# Patient Record
Sex: Female | Born: 1965 | Race: White | Hispanic: Yes | Marital: Single | Smoking: Never smoker
Health system: Southern US, Community
[De-identification: ages and names within clinical notes are randomized; demographics above are authoritative.]

## PROBLEM LIST (undated history)

## (undated) DIAGNOSIS — R102 Pelvic and perineal pain unspecified side: Secondary | ICD-10-CM

## (undated) DIAGNOSIS — E785 Hyperlipidemia, unspecified: Secondary | ICD-10-CM

## (undated) DIAGNOSIS — E079 Disorder of thyroid, unspecified: Secondary | ICD-10-CM

## (undated) DIAGNOSIS — E039 Hypothyroidism, unspecified: Secondary | ICD-10-CM

## (undated) HISTORY — DX: Pelvic and perineal pain: R10.2

## (undated) HISTORY — DX: Pelvic and perineal pain unspecified side: R10.20

## (undated) HISTORY — DX: Hyperlipidemia, unspecified: E78.5

---

## 2008-12-05 DIAGNOSIS — E079 Disorder of thyroid, unspecified: Secondary | ICD-10-CM

## 2008-12-05 HISTORY — DX: Disorder of thyroid, unspecified: E07.9

## 2009-01-06 ENCOUNTER — Emergency Department (HOSPITAL_COMMUNITY): Admission: EM | Admit: 2009-01-06 | Discharge: 2009-01-07 | Payer: Self-pay | Admitting: Emergency Medicine

## 2011-03-22 LAB — POCT I-STAT, CHEM 8
BUN: 13 mg/dL (ref 6–23)
Calcium, Ion: 1.21 mmol/L (ref 1.12–1.32)
Chloride: 98 mEq/L (ref 96–112)
Glucose, Bld: 362 mg/dL — ABNORMAL HIGH (ref 70–99)
HCT: 46 % (ref 36.0–46.0)
Potassium: 3.7 mEq/L (ref 3.5–5.1)

## 2011-03-22 LAB — DIFFERENTIAL
Lymphocytes Relative: 21 % (ref 12–46)
Lymphs Abs: 2.4 10*3/uL (ref 0.7–4.0)
Monocytes Absolute: 0.6 10*3/uL (ref 0.1–1.0)
Monocytes Relative: 5 % (ref 3–12)
Neutro Abs: 8.5 10*3/uL — ABNORMAL HIGH (ref 1.7–7.7)
Neutrophils Relative %: 73 % (ref 43–77)

## 2011-03-22 LAB — URINALYSIS, ROUTINE W REFLEX MICROSCOPIC
Bilirubin Urine: NEGATIVE
Glucose, UA: >1000 mg/dL — AB
Hgb urine dipstick: NEGATIVE
Specific Gravity, Urine: 1.039 — ABNORMAL HIGH (ref 1.005–1.030)
Urobilinogen, UA: 0.2 mg/dL (ref 0.0–1.0)
pH: 5.5 (ref 5.0–8.0)

## 2011-03-22 LAB — HEPATIC FUNCTION PANEL
ALT: 13 U/L (ref 0–35)
Albumin: 3.6 g/dL (ref 3.5–5.2)
Alkaline Phosphatase: 114 U/L (ref 39–117)
Indirect Bilirubin: 0.6 mg/dL (ref 0.3–0.9)
Total Protein: 8 g/dL (ref 6.0–8.3)

## 2011-03-22 LAB — URINE MICROSCOPIC-ADD ON

## 2011-03-22 LAB — CBC
Hemoglobin: 13.7 g/dL (ref 12.0–15.0)
RBC: 4.91 MIL/uL (ref 3.87–5.11)
WBC: 11.6 10*3/uL — ABNORMAL HIGH (ref 4.0–10.5)

## 2011-03-22 LAB — GLUCOSE, CAPILLARY: Glucose-Capillary: 321 mg/dL — ABNORMAL HIGH (ref 70–99)

## 2011-03-22 LAB — LIPASE, BLOOD: Lipase: 52 U/L (ref 11–59)

## 2011-07-29 ENCOUNTER — Other Ambulatory Visit (HOSPITAL_COMMUNITY): Payer: Self-pay | Admitting: *Deleted

## 2011-07-29 DIAGNOSIS — Z1231 Encounter for screening mammogram for malignant neoplasm of breast: Secondary | ICD-10-CM

## 2011-08-04 ENCOUNTER — Ambulatory Visit (HOSPITAL_COMMUNITY)
Admission: RE | Admit: 2011-08-04 | Discharge: 2011-08-04 | Disposition: A | Discharge status: Home / Self Care | Payer: Self-pay | Source: Ambulatory Visit | Attending: Family Medicine | Admitting: Family Medicine

## 2011-08-04 DIAGNOSIS — Z1231 Encounter for screening mammogram for malignant neoplasm of breast: Secondary | ICD-10-CM

## 2012-02-28 ENCOUNTER — Emergency Department (HOSPITAL_COMMUNITY): Payer: Self-pay

## 2012-02-28 ENCOUNTER — Encounter (HOSPITAL_COMMUNITY): Payer: Self-pay | Admitting: *Deleted

## 2012-02-28 ENCOUNTER — Emergency Department (HOSPITAL_COMMUNITY)
Admission: EM | Admit: 2012-02-28 | Discharge: 2012-02-28 | Disposition: A | Discharge status: Hospice | Payer: Self-pay | Attending: Emergency Medicine | Admitting: Emergency Medicine

## 2012-02-28 DIAGNOSIS — IMO0001 Reserved for inherently not codable concepts without codable children: Secondary | ICD-10-CM | POA: Insufficient documentation

## 2012-02-28 DIAGNOSIS — R5381 Other malaise: Secondary | ICD-10-CM | POA: Insufficient documentation

## 2012-02-28 DIAGNOSIS — E119 Type 2 diabetes mellitus without complications: Secondary | ICD-10-CM | POA: Insufficient documentation

## 2012-02-28 DIAGNOSIS — J3489 Other specified disorders of nose and nasal sinuses: Secondary | ICD-10-CM | POA: Insufficient documentation

## 2012-02-28 DIAGNOSIS — R05 Cough: Secondary | ICD-10-CM | POA: Insufficient documentation

## 2012-02-28 DIAGNOSIS — R509 Fever, unspecified: Secondary | ICD-10-CM | POA: Insufficient documentation

## 2012-02-28 DIAGNOSIS — Z79899 Other long term (current) drug therapy: Secondary | ICD-10-CM | POA: Insufficient documentation

## 2012-02-28 DIAGNOSIS — J40 Bronchitis, not specified as acute or chronic: Secondary | ICD-10-CM

## 2012-02-28 DIAGNOSIS — R61 Generalized hyperhidrosis: Secondary | ICD-10-CM | POA: Insufficient documentation

## 2012-02-28 DIAGNOSIS — R059 Cough, unspecified: Secondary | ICD-10-CM | POA: Insufficient documentation

## 2012-02-28 DIAGNOSIS — R0602 Shortness of breath: Secondary | ICD-10-CM | POA: Insufficient documentation

## 2012-02-28 DIAGNOSIS — R070 Pain in throat: Secondary | ICD-10-CM | POA: Insufficient documentation

## 2012-02-28 DIAGNOSIS — R51 Headache: Secondary | ICD-10-CM | POA: Insufficient documentation

## 2012-02-28 HISTORY — DX: Disorder of thyroid, unspecified: E07.9

## 2012-02-28 MED ORDER — HYDROCOD POLST-CHLORPHEN POLST 10-8 MG/5ML PO LQCR
5.0000 mL | Freq: Once | ORAL | Status: AC
  Administered 2012-02-28: 5 mL via ORAL
  Filled 2012-02-28: qty 5

## 2012-02-28 MED ORDER — ALBUTEROL SULFATE HFA 108 (90 BASE) MCG/ACT IN AERS
2.0000 | INHALATION_SPRAY | Freq: Once | RESPIRATORY_TRACT | Status: AC
  Administered 2012-02-28: 2 via RESPIRATORY_TRACT
  Filled 2012-02-28: qty 6.7

## 2012-02-28 MED ORDER — HYDROCODONE-HOMATROPINE 5-1.5 MG/5ML PO SYRP: 5.0000 mL | ORAL_SOLUTION | Freq: Four times a day (QID) | ORAL | Status: AC | PRN

## 2012-02-28 NOTE — ED Provider Notes (Signed)
Medical screening examination/treatment/procedure(s) were performed by non-physician practitioner and as supervising physician I was immediately available for consultation/collaboration.  Cheri Guppy, MD 02/28/12 [phone]

## 2012-02-28 NOTE — ED Provider Notes (Signed)
History     CSN: [id number]  Arrival date & time 02/28/12  4098   First MD Initiated Contact with Patient 02/28/12 [phone]      Chief Complaint  Patient presents with  . Cough    (Consider location/radiation/quality/duration/timing/severity/associated sxs/prior treatment) Patient is a 46 y.o. female presenting with cough. The history is provided by the patient. The history is limited by a language barrier. A language interpreter was used.  Cough This is a new problem. The current episode started more than 2 days ago. The problem occurs constantly. The problem has not changed since onset.The cough is productive of sputum. The maximum temperature recorded prior to her arrival was 100 to 100.9 F. The fever has been present for less than 1 day. Associated symptoms include chills, sweats, headaches, rhinorrhea, sore throat, myalgias and shortness of breath. Pertinent negatives include no ear pain and no wheezing. She has tried cough syrup for the symptoms. She is not a smoker.  pt with symptoms for the last 3 days. Nasal congestion, low grade fever, cough. Too mucinex yesterday with no relief. Denies neck pain or stiffness, denies n/v/d, chest pain, abdominal pain.   Past Medical History  Diagnosis Date  . Diabetes mellitus   . Thyroid disease     History reviewed. No pertinent past surgical history.  No family history on file.  History  Substance Use Topics  . Smoking status: Never Smoker   . Smokeless tobacco: Not on file  . Alcohol Use: No    OB History    Grav Para Term Preterm Abortions TAB SAB Ect Mult Living                  Review of Systems  Constitutional: Positive for fever, chills and fatigue.  HENT: Positive for congestion, sore throat and rhinorrhea. Negative for ear pain, neck pain and neck stiffness.   Eyes: Negative.   Respiratory: Positive for cough and shortness of breath. Negative for chest tightness and wheezing.   Cardiovascular: Negative.     Gastrointestinal: Negative.   Genitourinary: Negative.   Musculoskeletal: Positive for myalgias.  Skin: Negative.   Neurological: Positive for headaches.  Psychiatric/Behavioral: Negative.     Allergies  Review of patient's allergies indicates no known allergies.  Home Medications   Current Outpatient Rx  Name Route Sig Dispense Refill  . BENZOCAINE-MENTHOL 6-10 MG MT LOZG Oral Take 1 lozenge by mouth every 2 (two) hours as needed. For sore throat    . GUAIFENESIN 100 MG/5ML PO LIQD Oral Take 200 mg by mouth 3 (three) times daily as needed. For cough    . LEVOTHYROXINE SODIUM 50 MCG PO TABS Oral Take 50 mcg by mouth daily.    Marland Kitchen METFORMIN HCL 500 MG PO TABS Oral Take 500 mg by mouth 2 (two) times daily with a meal.      BP 130/72  Pulse 89  Temp(Src) 98.3 F (36.8 C) (Oral)  Resp 17  Ht 5\' 2"  (1.575 m)  Wt 160 lb (72.576 kg)  BMI 29.26 kg/m2  SpO2 97%  Physical Exam  Nursing note and vitals reviewed. Constitutional: She is oriented to person, place, and time. She appears well-developed and well-nourished. No distress.  HENT:  Head: Normocephalic.  Right Ear: Tympanic membrane, external ear and ear canal normal.  Left Ear: Tympanic membrane, external ear and ear canal normal.  Nose: Rhinorrhea present.  Mouth/Throat: Uvula is midline, oropharynx is clear and moist and mucous membranes are normal.  Eyes: Conjunctivae are  normal.  Neck: Normal range of motion. Neck supple.  Cardiovascular: Normal rate, regular rhythm and normal heart sounds.   Pulmonary/Chest: Effort normal and breath sounds normal. No respiratory distress. She has no wheezes. She has no rales.       coughing  Abdominal: Soft. Bowel sounds are normal. She exhibits no distension. There is no tenderness.  Musculoskeletal: Normal range of motion.  Lymphadenopathy:    She has no cervical adenopathy.  Neurological: She is alert and oriented to person, place, and time.  Skin: Skin is warm and dry.   Psychiatric: She has a normal mood and affect.    ED Course  Procedures (including critical care time)  Pt with nasal congestion, sore throat, cough. Will get CXR to r/o pneumonia. Pt afebrile. VS normal.  Dg Chest 2 View  02/28/2012  *RADIOLOGY REPORT*  Clinical Data: Cough, fever.  CHEST - 2 VIEW  Comparison: None  Findings: Heart and mediastinal contours are within normal limits. No focal opacities or effusions.  No acute bony abnormality.  IMPRESSION: No active cardiopulmonary disease.  Original Report Authenticated By: Cyndie Chime, M.D.   CXR negative. Pt in NAD. Will treat with cough medications, inhaler. I suspect this is viral. Her lungs are clear, afebrile, symptoms for about 3 days. Instructed to follow up with PCP closely and return if unable to follow up and worsening.    No diagnosis found.    MDM          Lottie Mussel, PA 02/28/12 1535

## 2012-02-28 NOTE — ED Notes (Addendum)
Patient with 3 day hx of cough and being unable to sleep. Patient reported to have a fever on yesterday.  Patient has tried otc treatment w/o relief.  Information provided by family who translated,  She speaks spanish

## 2012-02-28 NOTE — Discharge Instructions (Signed)
Your chest x-ray is normal today, I suspect you have a viral upper respiratory infection and bronchitis. Take tylenol for fever and body aches. Drink plenty of fluids. Albuterol 2 puffs every 4 hrs. Take hycodan for cough as prescribed as needed. Continue robitussin. It may take few days for your symptoms to improve. If worsening, follow up with your doctor or return here.   Bronquitis aguda  (Acute Bronchitis)  Usted padece bronquitis aguda. Esto significa que tiene catarro bronquial. Las vas areas en los pulmones estn irritadas y le duelen (inflamadas). "Aguda" significa de comienzo sbito.  CAUSAS  La causa es el mismo virus que produce el resfro.  SNTOMAS   Dolores en el cuerpo   [employer].   Escalofros.   [employer].   Falta de aire.   Dolor de garganta  TRATAMIENTO  Generalmente la bronquitis aguda se trata con reposo y medicamentos para bajar la fiebre o Secretary/administrator tos. La [employer] de los sntomas deben desaparecer luego de [address] o de [place]. Tome mucho lquido para Restaurant manager, fast food las secreciones y Statistician. El mdico podr indicarle el uso de un inhalador para mejorar los sntomas. El inhalador mejora la falta de aire y [place] a Scientist, physiological tos. Puede tomar analgsicos de venta libre o medicamentos para calmar la tos, el Dentist y la [place]. Un vaporizador de aire fro podr ayudarlo a [employer] bronquiales y Statistician su eliminacin.  En general, no es necesario tomar antibiticos pero pueden prescribirse si fuma, tiene una enfermedad grave, sufre problemas pulmonares crnicos, es [place] persona de edad avanzada o tiene serios riesgos de sufrir complicaciones.Las Environmental consultant y el asma pueden empeorar la bronquitis. Los episodios repetidos de bronquitis pueden causar problemas pulmonares crnicos.  Evite fumar o aspirar el humo de otros fumadores.La exposicin al humo del cigarrillo o a irritantes qumicos har que la bronquitis  empeore. Si fuma, considere el uso de goma de Theatre manager o parches para la piel que contengan nicotina para Paramedic los sntomas de abstinencia. Si deja de fumar, sus pulmones se curarn ms rpido.  La recuperacin de la bronquitis puede ser lenta, pero debera sentirse mejor despus de 2  3 das. La tos a veces puede durar entre 3 y 4 semanas.  Para evitar otro brote de bronquitis aguda:   Deje de fumar.   Lvese las manos con frecuencia o use un desinfectante para manos, para eliminar virus.   Evite el contacto con personas que estn resfriadas o que tengan sntomas de haber contrado un virus.   Trate de no llevar las manos a la boca, la nariz o los ojos.  SOLICITE ATENCIN MDICA DE INMEDIATO SI:   Le sube la fiebre, tiene escalofros o Careers information officer.   Le falta el aire o escupe flema con sangre.   Se deshidrata, se desmaya, vomita repetidas veces o siente un dolor de cabeza muy intenso.   No mejora, o empeora, despus de 1 semana de tratamiento.  ASEGRESE DE QUE:   Comprende estas instrucciones.   Controlar su enfermedad.   Solicitar ayuda de inmediato si no mejora o si empeora.  Document Released: 11/21/2005 Document Revised: 11/10/2011 [facility] Patient Information 2012 [place], [place].

## 2012-08-17 ENCOUNTER — Other Ambulatory Visit (HOSPITAL_COMMUNITY): Payer: Self-pay | Admitting: Geriatric Medicine

## 2012-08-17 DIAGNOSIS — Z1231 Encounter for screening mammogram for malignant neoplasm of breast: Secondary | ICD-10-CM

## 2012-09-05 ENCOUNTER — Ambulatory Visit (HOSPITAL_COMMUNITY)
Admission: RE | Admit: 2012-09-05 | Discharge: 2012-09-05 | Disposition: A | Discharge status: Home / Self Care | Payer: Self-pay | Source: Ambulatory Visit | Attending: Geriatric Medicine | Admitting: Geriatric Medicine

## 2012-09-05 DIAGNOSIS — Z1231 Encounter for screening mammogram for malignant neoplasm of breast: Secondary | ICD-10-CM

## 2012-11-23 ENCOUNTER — Ambulatory Visit (INDEPENDENT_AMBULATORY_CARE_PROVIDER_SITE_OTHER): Payer: Self-pay | Admitting: Obstetrics & Gynecology

## 2012-11-23 ENCOUNTER — Encounter: Payer: Self-pay | Admitting: Obstetrics & Gynecology

## 2012-11-23 VITALS — BP 131/88 | HR 81 | Temp 97.8°F | Ht <= 58 in | Wt 150.0 lb

## 2012-11-23 DIAGNOSIS — Z01812 Encounter for preprocedural laboratory examination: Secondary | ICD-10-CM

## 2012-11-23 DIAGNOSIS — R1900 Intra-abdominal and pelvic swelling, mass and lump, unspecified site: Secondary | ICD-10-CM

## 2012-11-23 LAB — BASIC METABOLIC PANEL
BUN: 17 mg/dL (ref 6–23)
Chloride: 103 mEq/L (ref 96–112)
Potassium: 4.7 mEq/L (ref 3.5–5.3)
Sodium: 138 mEq/L (ref 135–145)

## 2012-11-23 NOTE — [facility] Notes (Signed)
Patient referred from [facility] for pelvic pain with US showing mass- patient has a cd of the ultrasound. States she had a period in October and November, but no period in September.States before that were regular .States periods are normal periods , not heavy. States pelvic pain  And also states has pain in upper abdomen at same time starts about 4 days before period and lasts until period stops which period lasts 3-4 days.

## 2012-11-23 NOTE — [facility] Notes (Signed)
Subjective:     Patient ID: Cynthia Wiley, female   DOB: [birth year], 46 y.o.   MRN: [id number]  HPI Cynthia Wiley is a 46 year old female who presents to clinic today for evaluation of an abdominal mass. The patient states that she started gaining weight and size in her abdomen approximately 3 years ago. She was sent for evaluation from her PCP to Dr. Mayford Knife with [facility] and had an ultrasound on 10/30/12 that should an enlarged uterus and mass. The patient states that she has been having painful periods for the last few months which prompted her to be evaluated. At the worst the pain is 8/10 however it is the few days before her period and during her period and then the pain resolves. She does not have any pain today. She states that her periods are regular and not heavy. She does not have any abnormal discharge.   The patient states that she saw Dr. Mayford Knife again following the [place] and he recommended she see a GYN for surgery.   Review of Systems All negative unless otherwise noted in HPI    Objective:   Physical Exam BP 131/88  Pulse 81  Temp 97.8 F (36.6 C)  Ht 4\' 10"  (1.473 m)  Wt 150 lb (68.04 kg)  BMI 31.35 kg/m2  LMP 10/22/2012 GENERAL: Well-developed, well-nourished female in no acute distress.  HEENT: Normocephalic, atraumatic. Sclerae anicteric.   LUNGS: Lung clear to auscultation. Normal respiratory effort.  HEART: Regular rate and rhythm. ABDOMEN: Firm, nontender, distended. Large, firm mass felt.   PELVIC: Normal external female genitalia. Vagina is pink and rugated.  Normal discharge. Normal cervix contour. Pap smear obtained. Uterus enlarged, measuring 30 cm from the pubic symphysis. No adnexal mass or tenderness.  EXTREMITIES: No cyanosis, clubbing, or edema.     Assessment:     1. Large uterine mass; uterine fibroid vs. Ovarian cyst      Plan:     CT abdomen/pelvis scheduled today BUN/Cr drawn in office today Pap obtained today Patient will  follow up in clinic to discuss results of CT scan and approach for surgery with Dr. Erin Fulling      Pt seen and examined.  30week sized uterine fibroids that fill the entire pelvis.  Pt is NOT a candidate for minimally invasive surgery at this point.  Pt needs a CT to confirm that this mass is uterine.  If that is the case, we will proceed with scheduling a  TAH.  Pt understands this and wants to proceed. Will have pt f/u for OV once results of CT are in.  Attestation of Attending Supervision of Advanced Practitioner (CNM/NP): Evaluation and management procedures were performed by the Advanced Practitioner under my supervision and collaboration.  I have reviewed the Advanced Practitioner's note and chart, and I agree with the management and plan.  HARRAWAY-SMITH, CAROLYN 11:00 AM

## 2012-11-23 NOTE — Patient Instructions (Addendum)
Fibroma uterino  (Uterine Fibroid)  Un fibroma uterino es Facilities manager (tumor) dentro del tero de Pateros. Este tipo de tumor no es Insurance risk surveyor y no se extiende fuera del tero. Una mujer puede tener uno o varios fibromas y algunos pueden ser bastante grandes. Un fibroma puede variar en tamao, peso y Immunologist en que se desarrolla dentro del tero. La mayora de los fibromas no necesitan tratamiento mdico, pero algunos pueden causar dolor o sangrado abundante durante los perodos y [place]. CAUSAS  Un fibroma es el resultado del desarrollo continuo de una nica clula uterina que sigue creciendo (no regulada) que es diferente al resto de las clulas del cuerpo humano. La mayora de las clulas tiene un mecanismo de control que evita que se reproduzcan de Psychologist, occupational.  SNTOMAS   Sangrado.  Dolor y sensacin de presin en la pelvis.  Problemas en la vejiga debido al tamao del fibroma.  Infertilidad y abortos espontneos, segn el tamao y la ubicacin del fibroma. DIAGNSTICO  El diagnstico se hace por examen fsico. El mdico puede palpar los tumores abultados al realizar el examen de la pelvis. Una ecografa puede brindar informacin adicional importante acerca del tamao, la ubicacin y el nmero de tumores. Es raro que sea Passenger transport manager otras pruebas, como tomografa computada o Health visitor.  TRATAMIENTO   Su mdico puede considerar que es conveniente esperar y Pharmacologist. Esto incluye el control del fibroma por parte del mdico para observar si crece o disminuye su tamao.   Podr recomendarle un tratamiento hormonal o el uso de un dispositivo intrauterino (DIU).   En algunos casos es necesaria la ciruga para extirpar el fibroma (miomectoma) o el tero (histerectoma). Esto depender de su situacin. Cuando una mujer desea quedar embarazada y los fibromas interfieren en su fertilidad, el mdico puede recomendar la extirpacin del fibroma.    INSTRUCCIONES PARA EL CUIDADO EN EL HOGAR  Los cuidados en el hogar dependen del tratamiento que haya recibido. En general:   Concurra puntualmente a las citas de control con el mdico.   Slo tome los medicamentos que le indic su mdico. No tome aspirina. Puede ocasionar hemorragias.   Si tiene perodos muy abundantes y debe cambiar un tampn o una toalla higinica en media hora o menos, comunquese con su mdico inmediatamente. Si sus perodos son molestos pero no tan abundantes, acustese con los pies ligeramente elevados por encima del nivel del corazn. Coloque compresas fras en la zona inferior del abdomen.   Si sus perodos son muy abundantes  , anote el nmero de compresas o tampones que [place] cada mes. Lleve esta informacin cuando visite a su mdico.   Consulte al mdico si debe tomar pldoras de hierro.   Incluya vegetales en su dieta.   Si le recetaron un tratamiento hormonal, tome los medicamentos hormonales como le indicaron.   Si le indicaron la ciruga, pdale informacin especfica a su mdico.  SOLICITE ATENCIN MDICA DE INMEDIATO SI:   Siente dolor o clicos en la pelvis y no puede controlarlos con los medicamentos.   El dolor en la pelvis aumenta de manera repentina.   Aumenta el sangrado entre los perodos o [employer].   Se siente mareado o tiene episodios de [place].  ASEGRESE DE QUE:   Comprende estas instrucciones.  Controlar su enfermedad.  Solicitar ayuda de inmediato si no mejora o si empeora. Document Released: 11/21/2005 Document Revised: 02/13/2012 [facility] Patient Information 2013 [place], [place]. Fibromas (Fibroids) Los fibromas son  bultos (tumores) que pueden Conservation officer, nature del cuerpo de Nurse, mental health. Estos tumores no son cancerosos. Pueden variar en tamao, peso y lugar en el que crecen. CUIDADOS EN EL HOGAR  No tome aspirina.  Anote el nmero de apsitos o tampones que [place] durante el perodo. Infrmelo  a su mdico. Esto puede ayudar a determinar el mejor tratamiento para usted. SOLICITE AYUDA DE INMEDIATO SI:  Siente dolor en la zona inferior del vientre (abdomen) y no se alivia con analgsicos.  Tiene clicos que no se calman con medicamentos  Aumenta el sangrado entre perodos o durante el mismo.  Sufre mareos o se desvanece (se desmaya).  El dolor en el vientre [place]. ASEGRESE DE QUE:  Comprende estas instrucciones.  Controlar su enfermedad.  Solicitar ayuda de inmediato si no mejora o empeora. Document Released: 03/08/2011 Document Revised: 02/13/2012 [facility] Patient Information 2013 [place], [place].

## 2012-11-30 ENCOUNTER — Ambulatory Visit (HOSPITAL_COMMUNITY)
Admission: RE | Admit: 2012-11-30 | Discharge: 2012-11-30 | Disposition: A | Discharge status: Home / Self Care | Payer: Self-pay | Source: Ambulatory Visit | Attending: Obstetrics & Gynecology | Admitting: Obstetrics & Gynecology

## 2012-11-30 ENCOUNTER — Other Ambulatory Visit: Payer: Self-pay | Admitting: General Practice

## 2012-11-30 DIAGNOSIS — R1900 Intra-abdominal and pelvic swelling, mass and lump, unspecified site: Secondary | ICD-10-CM

## 2012-11-30 DIAGNOSIS — D259 Leiomyoma of uterus, unspecified: Secondary | ICD-10-CM | POA: Insufficient documentation

## 2012-11-30 DIAGNOSIS — N1330 Unspecified hydronephrosis: Secondary | ICD-10-CM | POA: Insufficient documentation

## 2012-11-30 MED ORDER — IOHEXOL 300 MG/ML  SOLN
100.0000 mL | Freq: Once | INTRAMUSCULAR | Status: AC | PRN
  Administered 2012-11-30: 100 mL via INTRAVENOUS

## 2012-12-03 ENCOUNTER — Ambulatory Visit (INDEPENDENT_AMBULATORY_CARE_PROVIDER_SITE_OTHER): Payer: Self-pay | Admitting: Obstetrics & Gynecology

## 2012-12-03 ENCOUNTER — Encounter: Payer: Self-pay | Admitting: Obstetrics & Gynecology

## 2012-12-03 VITALS — BP 142/77 | HR 77 | Temp 97.0°F | Ht <= 58 in | Wt 153.3 lb

## 2012-12-03 DIAGNOSIS — D259 Leiomyoma of uterus, unspecified: Secondary | ICD-10-CM

## 2012-12-03 DIAGNOSIS — E119 Type 2 diabetes mellitus without complications: Secondary | ICD-10-CM

## 2012-12-03 DIAGNOSIS — D219 Benign neoplasm of connective and other soft tissue, unspecified: Secondary | ICD-10-CM

## 2012-12-03 LAB — HEMOGLOBIN A1C
Hgb A1c MFr Bld: 7.1 % — ABNORMAL HIGH (ref <5.7)
Mean Plasma Glucose: 157 mg/dL — ABNORMAL HIGH (ref <117)

## 2012-12-03 NOTE — Patient Instructions (Addendum)
Informacin sobre la histerectoma  (Hysterectomy Information)  Neomia Dear histerectoma es un procedimiento en el que se extirpa quirrgicamente el tero. Ya no tendr perodos menstruales ni quedar embarazada. Tambin durante esta ciruga podrn extirparle las trompas y los ovarios (salpingo-ooforectoma bilateral).  RAZONES PARA REALIZAR UNA HISTERECTOMA   Sangrado persistente, anormal.  Infeccin o dolor plvico de larga duracin (crnico).  El revestimiento del tero (endometrio) comienza a desarrollarse fuera del tero (endometriosis).  El endometrio comienza a desarrollarse en el msculo del tero (adenomiosis).  El tero desciende hacia la vagina (prolapso de los rganos plvicos).  Fibromas uterinos sintomticos.  Clulas precancerosas.  Cncer cervical o cncer uterino. TIPOS DE HISTERECTOMA   Histerectoma supracervical. Este tipo de ciruga elimina la parte superior del tero, pero no el cuello del tero.  Histerectoma total. Se extirpa el tero y el cuello uterino.  Histerectoma radical. Se extrae el tero, el cuello uterino y el tejido fibroso que sostiene el tero en su lugar en la pelvis (parametrio). FORMAS EN QUE SE PUEDE REALIZAR UNA HISTERECTOMA   Histerectoma abdominal. Se realiza gran corte quirrgico (incisin) en el abdomen. Se extirpa el tero a travs de esta incisin.  Histerectoma vaginal. Se hace una incisin en la vagina. Se extirpa el tero a travs de esta incisin. No hay incisiones abdominales.  Histerectoma laparoscpica convencional. Se inserta un tubo delgado que emite luz y que posee una cmara (laparoscopio) en 3 o 4 incisiones pequeas en el abdomen. El tero se corta en trozos pequeos. Las piezas pequeas se eliminan a travs de las incisiones, o se retiran a travs de la vagina.  Histerectoma laparoscpica vaginal asistida. Se hacen tres o cuatro incisiones pequeas en el abdomen. Parte de la ciruga se realiza por va laparoscpica y  parte por va vaginal. El tero se extirpa a travs de la vagina.  Histerectoma laparoscpica asistida por robot. Se inserta un laparoscopio en 3 o 4 incisiones pequeas en el abdomen. Se utiliza un dispositivo controlado por computadora para que el cirujano vea una imagen en 3D. Esto permite movimientos ms precisos de los instrumentos quirrgicos. El tero se corta en trozos pequeos y se retira a travs de las incisiones o se elimina a travs de la vagina. RIESGOS DE LA HISTERECTOMA   Hemorragia y riesgos de necesitar una transfusin sangunea. Informe a su mdico si no quiere recibir productos de Risk manager.  Cogulos de [employer] piernas o los pulmones.  Infecciones.  Lesin en los rganos circundantes.  Problemas o efectos secundarios por la anestesia.  Conversin a una histerectoma abdominal. QU ESPERAR DESPUES DE UNA HISTERECTOMA   Le administrarn medicamentos para calmar el dolor.  Necesitar que alguien permanezca con usted durante los primeros 3 a 5 das despus de regresar a Higher education careers adviser.  Tendr que hacer un control con su cirujano en 2 a 4 semanas despus de la ciruga para evaluar su progreso.  Podr tener sntomas tempranos de menopausia, como sofocos, sudores nocturnos e insomnio.  Si le han realizado una histerectoma por un problema que no era cncer u otra enfermedad que podra causar cncer, ya no necesitar un Papanicolaou. Sin embargo, si ya no necesita hacerse un Papanicolau, es una buena idea hacerse un examen regularmente para asegurarse de que no hay otros problemas. Document Released: 11/21/2005 Document Revised: 02/13/2012 [facility] Patient Information 2013 [place], [place].

## 2012-12-04 NOTE — [facility] Notes (Signed)
Subjective:     Patient ID: Cynthia Wiley, female   DOB: 12/31/1965, 46 y.o.   MRN: [id number]  HPI Pt continues to c/o pain and bleeding from pelvic mass.  She is here to f/u her CT and to discuss surgery.  Past Medical History  Diagnosis Date  . Diabetes mellitus   . Thyroid disease   . Pelvic pain in female    No past surgical history on file. History   Social History  . Marital Status: Single    Spouse Name: N/A    Number of Children: N/A  . Years of Education: N/A   Occupational History  . Not on file.   Social History Main Topics  . Smoking status: Never Smoker   . Smokeless tobacco: Never Used  . Alcohol Use: No  . Drug Use: No  . Sexually Active: Yes    Birth Control/ Protection: None   Other Topics Concern  . Not on file   Social History Narrative  . No narrative on file   Family History  Problem Relation Age of Onset  . Diabetes Father   . Heart disease Father   . Diabetes Sister   . Hyperlipidemia Brother    Current Outpatient Prescriptions on File Prior to Visit  Medication Sig Dispense Refill  . benzocaine-menthol (CHLORASEPTIC SORE THROAT) 6-10 MG lozenge Take 1 lozenge by mouth every 2 (two) hours as needed. For sore throat      . Dextromethorphan-Guaifenesin (ROBITUSSIN DM SUGAR FREE PO) Take 30 mLs by mouth as needed.      Marland Kitchen guaiFENesin (ROBITUSSIN) 100 MG/5ML liquid Take 200 mg by mouth 3 (three) times daily as needed. For cough      . levothyroxine (SYNTHROID, LEVOTHROID) 50 MCG tablet Take 50 mcg by mouth daily.      . metFORMIN (GLUCOPHAGE) 500 MG tablet Take 500 mg by mouth 2 (two) times daily with a meal. Takes 850mg  BID- changed 6/13      . loratadine (CLARITIN) 10 MG tablet Take 10 mg by mouth daily.         Review of Systems     Objective:   Physical ExamBP 142/77  Pulse 77  Temp 97 F (36.1 C) (Oral)  Ht 4\' 10"  (1.473 m)  Wt 153 lb 4.8 oz (69.536 kg)  BMI 32.04 kg/m2  LMP 10/22/2012  Lungs: CTA CV: RRR Abd: soft,  NT, ND pelvic mass palpable to 30cm.  Mobile.    PAP 12/20 Neg with neg HR HPV CT 11/30/12 Findings: Multiple partially calcified and partially degenerated  fibroids are seen arising from the uterus which extend into the mid  abdomen. The largest fibroid arises from the posterior upper  uterine corpus and fundus and measures 22 x 16 by 15 cm. The  second largest fibroid arises from the anterior uterine corpus,  measures 11 cm and shows areas of central cystic degeneration.  Overall uterine dimensions 24.1 x 16.2 x 21.8 cm, for estimated  volume of 4451 ml.  There is no evidence of invasion of adjacent structures. There is  mass effect on the distal right ureter causing severe right  hydronephrosis and postobstructive renal parenchymal atrophy.  There is no evidence of renal masses or left-sided hydronephrosis.  The liver, gallbladder, pancreas, spleen, adrenal glands are normal  in appearance. No evidence of abdominal or pelvic lymphadenopathy.  No evidence of inflammatory process or abnormal fluid collections.  No evidence of bowel obstruction or hernia.  IMPRESSION:  1. Markedly enlarged  myomatous uterus, with largest fibroid  measuring 22 cm.  2. Severe right renal hydronephrosis and parenchymal atrophy, due  to distal right ureteral compression by the myomatous uterus.       Assessment:    Large fibroid uterus- symptomatic and assoc with significant hydronephrosis.  D/W treatment options which at this size is limited to surgery.  Pt has completed child bearing and desires surgical management with TAH with bilateral  Salpingectomy.  I reveiwed with pt the results of her CT which was significant for hydronephrosis   we will contact urology and have ureteral stents placed prior to the hyst to try to limit/prevent ureteral injury.  Plan:     HbA1C today Schedule TAH with Bilateral salpingectomy with placement of bilateral ureteral stents prior to the procedure by urology

## 2013-01-14 ENCOUNTER — Other Ambulatory Visit: Payer: Self-pay | Admitting: Urology

## 2013-01-17 ENCOUNTER — Ambulatory Visit: Payer: Self-pay | Admitting: Obstetrics & Gynecology

## 2013-01-23 ENCOUNTER — Encounter (HOSPITAL_COMMUNITY): Payer: Self-pay

## 2013-01-23 ENCOUNTER — Encounter (HOSPITAL_COMMUNITY)
Admission: RE | Admit: 2013-01-23 | Discharge: 2013-01-23 | Disposition: A | Discharge status: Home / Self Care | Payer: MEDICAID | Source: Ambulatory Visit | Attending: Obstetrics & Gynecology | Admitting: Obstetrics & Gynecology

## 2013-01-23 ENCOUNTER — Other Ambulatory Visit: Payer: Self-pay

## 2013-01-23 ENCOUNTER — Encounter (HOSPITAL_COMMUNITY): Payer: Self-pay | Admitting: Pharmacy Technician

## 2013-01-23 HISTORY — DX: Hypothyroidism, unspecified: E03.9

## 2013-01-23 LAB — BASIC METABOLIC PANEL
BUN: 15 mg/dL (ref 6–23)
Chloride: 101 mEq/L (ref 96–112)
Creatinine, Ser: 0.74 mg/dL (ref 0.50–1.10)
GFR calc Af Amer: >90 mL/min (ref >90)
GFR calc non Af Amer: >90 mL/min (ref >90)
Potassium: 3.9 mEq/L (ref 3.5–5.1)

## 2013-01-23 LAB — CBC
MCHC: 33.4 g/dL (ref 30.0–36.0)
MCV: 85.3 fL (ref 78.0–100.0)
Platelets: 374 10*3/uL (ref 150–400)
RDW: 14.1 % (ref 11.5–15.5)
WBC: 7.9 10*3/uL (ref 4.0–10.5)

## 2013-01-23 LAB — SURGICAL PCR SCREEN: MRSA, PCR: INVALID — AB

## 2013-01-23 NOTE — Patient Instructions (Addendum)
   Your procedure is scheduled NW:[id number] February 27th  Enter through the Main Entrance of [facility] at:8am Pick up the phone at the desk and dial [phone] and inform us of your arrival.  Please call this number if you have any problems the morning of surgery: [phone]  Remember: Do not eat or drink anything after midnight on Wednesday Hold your metformin for 24 hours-last dose Wednesday morning  Do not wear jewelry, make-up, or FINGER nail polish No metal in your hair or on your body. Do not wear lotions, powders, perfumes. You may wear deodorant.  Please use your CHG wash as directed prior to surgery.  Do not shave anywhere for at least 12 hours prior to first CHG shower.  Do not bring valuables to the hospital. Contacts, dentures or bridgework may not be worn into surgery.  Leave suitcase in the car. After Surgery it may be brought to your room. For patients being admitted to the hospital, checkout time is 11:00am the day of discharge.  Patients discharged on the day of surgery will not be allowed to drive home.

## 2013-01-23 NOTE — Pre-Procedure Instructions (Signed)
Eda Royal assisted with pat visit for Interpretation

## 2013-01-25 LAB — MRSA CULTURE

## 2013-01-31 ENCOUNTER — Inpatient Hospital Stay (HOSPITAL_COMMUNITY)
Admission: RE | Admit: 2013-01-31 | Discharge: 2013-02-02 | DRG: 742 | Disposition: A | Discharge status: Home / Self Care | Payer: MEDICAID | Source: Ambulatory Visit | Attending: Obstetrics & Gynecology | Admitting: Obstetrics & Gynecology

## 2013-01-31 ENCOUNTER — Encounter (HOSPITAL_COMMUNITY)
Admission: RE | Disposition: A | Discharge status: Home / Self Care | Payer: Self-pay | Source: Ambulatory Visit | Attending: Obstetrics & Gynecology

## 2013-01-31 ENCOUNTER — Encounter (HOSPITAL_COMMUNITY): Payer: Self-pay | Admitting: Anesthesiology

## 2013-01-31 ENCOUNTER — Inpatient Hospital Stay: Admit: 2013-01-31 | Payer: Self-pay | Admitting: Obstetrics & Gynecology

## 2013-01-31 ENCOUNTER — Inpatient Hospital Stay (HOSPITAL_COMMUNITY): Payer: MEDICAID | Admitting: Anesthesiology

## 2013-01-31 ENCOUNTER — Inpatient Hospital Stay (HOSPITAL_COMMUNITY): Payer: MEDICAID

## 2013-01-31 DIAGNOSIS — D219 Benign neoplasm of connective and other soft tissue, unspecified: Secondary | ICD-10-CM

## 2013-01-31 DIAGNOSIS — D251 Intramural leiomyoma of uterus: Secondary | ICD-10-CM | POA: Diagnosis present

## 2013-01-31 DIAGNOSIS — N1330 Unspecified hydronephrosis: Secondary | ICD-10-CM

## 2013-01-31 DIAGNOSIS — N920 Excessive and frequent menstruation with regular cycle: Secondary | ICD-10-CM | POA: Diagnosis present

## 2013-01-31 DIAGNOSIS — N949 Unspecified condition associated with female genital organs and menstrual cycle: Secondary | ICD-10-CM | POA: Diagnosis present

## 2013-01-31 DIAGNOSIS — N838 Other noninflammatory disorders of ovary, fallopian tube and broad ligament: Secondary | ICD-10-CM | POA: Diagnosis present

## 2013-01-31 DIAGNOSIS — D259 Leiomyoma of uterus, unspecified: Secondary | ICD-10-CM

## 2013-01-31 DIAGNOSIS — D252 Subserosal leiomyoma of uterus: Principal | ICD-10-CM

## 2013-01-31 HISTORY — PX: CYSTOSCOPY WITH STENT PLACEMENT: SHX5790

## 2013-01-31 HISTORY — PX: BILATERAL SALPINGECTOMY: SHX5743

## 2013-01-31 HISTORY — PX: ABDOMINAL HYSTERECTOMY: SHX81

## 2013-01-31 LAB — CBC
MCH: 28.7 pg (ref 26.0–34.0)
MCHC: 33 g/dL (ref 30.0–36.0)
Platelets: 224 10*3/uL (ref 150–400)
RBC: 2.02 MIL/uL — ABNORMAL LOW (ref 3.87–5.11)
RDW: 14.1 % (ref 11.5–15.5)

## 2013-01-31 LAB — PREGNANCY, URINE: Preg Test, Ur: NEGATIVE

## 2013-01-31 LAB — ABO/RH: ABO/RH(D): O POS

## 2013-01-31 SURGERY — HYSTERECTOMY, ABDOMINAL
Anesthesia: General | Site: Urethra | Laterality: Right | Wound class: Clean Contaminated

## 2013-01-31 SURGERY — HYSTERECTOMY, ABDOMINAL
Anesthesia: Choice | Site: Abdomen

## 2013-01-31 MED ORDER — OXYCODONE-ACETAMINOPHEN 5-325 MG PO TABS
1.0000 | ORAL_TABLET | ORAL | Status: DC | PRN
  Administered 2013-02-01 – 2013-02-02 (×4): 1 via ORAL
  Filled 2013-01-31 (×4): qty 1

## 2013-01-31 MED ORDER — CEFAZOLIN SODIUM-DEXTROSE 2-3 GM-% IV SOLR
INTRAVENOUS | Status: AC
  Filled 2013-01-31: qty 50

## 2013-01-31 MED ORDER — 0.9 % SODIUM CHLORIDE (POUR BTL) OPTIME
TOPICAL | Status: DC | PRN
  Administered 2013-01-31 (×5): 1000 mL

## 2013-01-31 MED ORDER — DOCUSATE SODIUM 100 MG PO CAPS: 100.0000 mg | ORAL_CAPSULE | Freq: Two times a day (BID) | ORAL | Status: DC | PRN

## 2013-01-31 MED ORDER — MIDAZOLAM HCL 2 MG/2ML IJ SOLN
INTRAMUSCULAR | Status: AC
  Filled 2013-01-31: qty 2

## 2013-01-31 MED ORDER — SIMETHICONE 80 MG PO CHEW: 80.0000 mg | CHEWABLE_TABLET | Freq: Four times a day (QID) | ORAL | Status: DC | PRN

## 2013-01-31 MED ORDER — PANTOPRAZOLE SODIUM 40 MG IV SOLR
40.0000 mg | Freq: Every day | INTRAVENOUS | Status: DC
  Administered 2013-01-31 – 2013-02-01 (×2): 40 mg via INTRAVENOUS
  Filled 2013-01-31 (×3): qty 40

## 2013-01-31 MED ORDER — NEOSTIGMINE METHYLSULFATE 1 MG/ML IJ SOLN
INTRAMUSCULAR | Status: AC
  Filled 2013-01-31: qty 1

## 2013-01-31 MED ORDER — NALOXONE HCL 0.4 MG/ML IJ SOLN: 0.4000 mg | INTRAMUSCULAR | Status: DC | PRN

## 2013-01-31 MED ORDER — INFLUENZA VIRUS VACC SPLIT PF IM SUSP
0.5000 mL | INTRAMUSCULAR | Status: AC
  Administered 2013-02-01: 0.5 mL via INTRAMUSCULAR
  Filled 2013-01-31: qty 0.5

## 2013-01-31 MED ORDER — MORPHINE SULFATE 4 MG/ML IJ SOLN: 1.0000 mg | INTRAMUSCULAR | Status: DC | PRN

## 2013-01-31 MED ORDER — DEXTROSE-NACL 5-0.45 % IV SOLN
INTRAVENOUS | Status: DC
  Administered 2013-01-31 – 2013-02-01 (×2): via INTRAVENOUS

## 2013-01-31 MED ORDER — BUPIVACAINE HCL (PF) 0.25 % IJ SOLN
INTRAMUSCULAR | Status: DC | PRN
  Administered 2013-01-31: 20 mL

## 2013-01-31 MED ORDER — HETASTARCH-ELECTROLYTES 6 % IV SOLN
INTRAVENOUS | Status: DC | PRN
  Administered 2013-01-31: 12:00:00 via INTRAVENOUS

## 2013-01-31 MED ORDER — FENTANYL CITRATE 0.05 MG/ML IJ SOLN
25.0000 ug | INTRAMUSCULAR | Status: DC | PRN
  Administered 2013-01-31: 50 ug via INTRAVENOUS

## 2013-01-31 MED ORDER — SODIUM CHLORIDE 0.9 % IJ SOLN: 9.0000 mL | INTRAMUSCULAR | Status: DC | PRN

## 2013-01-31 MED ORDER — DEXTROSE 5 % IV SOLN
3.0000 g | INTRAVENOUS | Status: DC
  Filled 2013-01-31: qty 3000

## 2013-01-31 MED ORDER — NEOSTIGMINE METHYLSULFATE 1 MG/ML IJ SOLN
INTRAMUSCULAR | Status: DC | PRN
  Administered 2013-01-31: 5 mg via INTRAVENOUS

## 2013-01-31 MED ORDER — EPHEDRINE SULFATE 50 MG/ML IJ SOLN
INTRAMUSCULAR | Status: DC | PRN
  Administered 2013-01-31: 10 mg via INTRAVENOUS
  Administered 2013-01-31: 15 mg via INTRAVENOUS

## 2013-01-31 MED ORDER — ZOLPIDEM TARTRATE 5 MG PO TABS: 5.0000 mg | ORAL_TABLET | Freq: Every evening | ORAL | Status: DC | PRN

## 2013-01-31 MED ORDER — ONDANSETRON HCL 4 MG/2ML IJ SOLN: 4.0000 mg | Freq: Four times a day (QID) | INTRAMUSCULAR | Status: DC | PRN

## 2013-01-31 MED ORDER — HYDROMORPHONE HCL PF 1 MG/ML IJ SOLN
INTRAMUSCULAR | Status: DC | PRN
  Administered 2013-01-31: 1 mg via INTRAVENOUS

## 2013-01-31 MED ORDER — LIDOCAINE HCL (CARDIAC) 20 MG/ML IV SOLN
INTRAVENOUS | Status: DC | PRN
  Administered 2013-01-31: 50 mg via INTRAVENOUS

## 2013-01-31 MED ORDER — MORPHINE SULFATE (PF) 1 MG/ML IV SOLN
INTRAVENOUS | Status: DC
  Administered 2013-01-31: 3 mg via INTRAVENOUS
  Administered 2013-01-31: 9 mg via INTRAVENOUS
  Administered 2013-01-31: 15:00:00 via INTRAVENOUS
  Administered 2013-02-01: 3 mg via INTRAVENOUS
  Administered 2013-02-01: 1.5 mg via INTRAVENOUS
  Administered 2013-02-01: 4.5 mg via INTRAVENOUS
  Filled 2013-01-31: qty 25

## 2013-01-31 MED ORDER — MENTHOL 3 MG MT LOZG
1.0000 | LOZENGE | OROMUCOSAL | Status: DC | PRN
  Administered 2013-01-31: 3 mg via ORAL
  Filled 2013-01-31: qty 9

## 2013-01-31 MED ORDER — STERILE WATER FOR IRRIGATION IR SOLN
Status: DC | PRN
  Administered 2013-01-31: 3000 mL

## 2013-01-31 MED ORDER — PHENYLEPHRINE 40 MCG/ML (10ML) SYRINGE FOR IV PUSH (FOR BLOOD PRESSURE SUPPORT)
PREFILLED_SYRINGE | INTRAVENOUS | Status: AC
  Filled 2013-01-31: qty 5

## 2013-01-31 MED ORDER — METFORMIN HCL 850 MG PO TABS
850.0000 mg | ORAL_TABLET | Freq: Two times a day (BID) | ORAL | Status: DC
  Administered 2013-02-01 – 2013-02-02 (×2): 850 mg via ORAL
  Filled 2013-01-31 (×6): qty 1

## 2013-01-31 MED ORDER — PROPOFOL 10 MG/ML IV EMUL
INTRAVENOUS | Status: AC
  Filled 2013-01-31: qty 20

## 2013-01-31 MED ORDER — EPHEDRINE 5 MG/ML INJ
INTRAVENOUS | Status: AC
  Filled 2013-01-31: qty 10

## 2013-01-31 MED ORDER — PHENYLEPHRINE 40 MCG/ML (10ML) SYRINGE FOR IV PUSH (FOR BLOOD PRESSURE SUPPORT)
PREFILLED_SYRINGE | INTRAVENOUS | Status: AC
  Filled 2013-01-31: qty 10

## 2013-01-31 MED ORDER — ONDANSETRON HCL 4 MG/2ML IJ SOLN
INTRAMUSCULAR | Status: AC
  Filled 2013-01-31: qty 2

## 2013-01-31 MED ORDER — FENTANYL CITRATE 0.05 MG/ML IJ SOLN
INTRAMUSCULAR | Status: AC
  Filled 2013-01-31: qty 5

## 2013-01-31 MED ORDER — KETOROLAC TROMETHAMINE 30 MG/ML IJ SOLN: 15.0000 mg | Freq: Once | INTRAMUSCULAR | Status: DC | PRN

## 2013-01-31 MED ORDER — MIDAZOLAM HCL 2 MG/2ML IJ SOLN: 0.5000 mg | Freq: Once | INTRAMUSCULAR | Status: DC | PRN

## 2013-01-31 MED ORDER — LIDOCAINE HCL (CARDIAC) 20 MG/ML IV SOLN
INTRAVENOUS | Status: AC
  Filled 2013-01-31: qty 5

## 2013-01-31 MED ORDER — FENTANYL CITRATE 0.05 MG/ML IJ SOLN
INTRAMUSCULAR | Status: DC | PRN
  Administered 2013-01-31 (×3): 100 ug via INTRAVENOUS
  Administered 2013-01-31: 50 ug via INTRAVENOUS

## 2013-01-31 MED ORDER — PHENYLEPHRINE HCL 10 MG/ML IJ SOLN
INTRAMUSCULAR | Status: DC | PRN
  Administered 2013-01-31: 80 ug via INTRAVENOUS
  Administered 2013-01-31: 200 ug via INTRAVENOUS
  Administered 2013-01-31: 160 ug via INTRAVENOUS
  Administered 2013-01-31: 40 ug via INTRAVENOUS
  Administered 2013-01-31: 200 ug via INTRAVENOUS
  Administered 2013-01-31 (×2): 40 ug via INTRAVENOUS
  Administered 2013-01-31 (×3): 80 ug via INTRAVENOUS
  Administered 2013-01-31: 160 ug via INTRAVENOUS

## 2013-01-31 MED ORDER — MIDAZOLAM HCL 5 MG/5ML IJ SOLN
INTRAMUSCULAR | Status: DC | PRN
  Administered 2013-01-31: 2 mg via INTRAVENOUS

## 2013-01-31 MED ORDER — ONDANSETRON HCL 4 MG PO TABS: 4.0000 mg | ORAL_TABLET | Freq: Four times a day (QID) | ORAL | Status: DC | PRN

## 2013-01-31 MED ORDER — FENTANYL CITRATE 0.05 MG/ML IJ SOLN
INTRAMUSCULAR | Status: AC
  Administered 2013-01-31: 50 ug via INTRAVENOUS
  Filled 2013-01-31: qty 2

## 2013-01-31 MED ORDER — PROPOFOL 10 MG/ML IV EMUL
INTRAVENOUS | Status: DC | PRN
  Administered 2013-01-31: 160 mg via INTRAVENOUS
  Administered 2013-01-31: 20 mg via INTRAVENOUS

## 2013-01-31 MED ORDER — DIPHENHYDRAMINE HCL 50 MG/ML IJ SOLN: 12.5000 mg | Freq: Four times a day (QID) | INTRAMUSCULAR | Status: DC | PRN

## 2013-01-31 MED ORDER — ROCURONIUM BROMIDE 100 MG/10ML IV SOLN
INTRAVENOUS | Status: DC | PRN
  Administered 2013-01-31: 20 mg via INTRAVENOUS
  Administered 2013-01-31: 5 mg via INTRAVENOUS
  Administered 2013-01-31: 35 mg via INTRAVENOUS
  Administered 2013-01-31: 20 mg via INTRAVENOUS

## 2013-01-31 MED ORDER — GLYCOPYRROLATE 0.2 MG/ML IJ SOLN
INTRAMUSCULAR | Status: AC
  Filled 2013-01-31: qty 1

## 2013-01-31 MED ORDER — MEPERIDINE HCL 25 MG/ML IJ SOLN
6.2500 mg | INTRAMUSCULAR | Status: DC | PRN
Start: 2013-01-31 — End: 2013-01-31

## 2013-01-31 MED ORDER — DIPHENHYDRAMINE HCL 12.5 MG/5ML PO ELIX: 12.5000 mg | ORAL_SOLUTION | Freq: Four times a day (QID) | ORAL | Status: DC | PRN

## 2013-01-31 MED ORDER — GLYCOPYRROLATE 0.2 MG/ML IJ SOLN
INTRAMUSCULAR | Status: DC | PRN
  Administered 2013-01-31: 1 mg via INTRAVENOUS

## 2013-01-31 MED ORDER — HETASTARCH-ELECTROLYTES 6 % IV SOLN
INTRAVENOUS | Status: AC
  Filled 2013-01-31: qty 500

## 2013-01-31 MED ORDER — PNEUMOCOCCAL VAC POLYVALENT 25 MCG/0.5ML IJ INJ
0.5000 mL | INJECTION | INTRAMUSCULAR | Status: AC
  Administered 2013-02-02: 0.5 mL via INTRAMUSCULAR
  Filled 2013-01-31: qty 0.5

## 2013-01-31 MED ORDER — LEVOTHYROXINE SODIUM 50 MCG PO TABS
50.0000 ug | ORAL_TABLET | ORAL | Status: DC
  Administered 2013-02-01 – 2013-02-02 (×2): 50 ug via ORAL
  Filled 2013-01-31 (×3): qty 1

## 2013-01-31 MED ORDER — HYDROMORPHONE HCL PF 1 MG/ML IJ SOLN
INTRAMUSCULAR | Status: AC
  Filled 2013-01-31: qty 1

## 2013-01-31 MED ORDER — PROMETHAZINE HCL 25 MG/ML IJ SOLN: 6.2500 mg | INTRAMUSCULAR | Status: DC | PRN

## 2013-01-31 MED ORDER — ONDANSETRON HCL 4 MG/2ML IJ SOLN
INTRAMUSCULAR | Status: DC | PRN
  Administered 2013-01-31: 4 mg via INTRAVENOUS

## 2013-01-31 MED ORDER — CEFAZOLIN SODIUM-DEXTROSE 2-3 GM-% IV SOLR
2.0000 g | Freq: Once | INTRAVENOUS | Status: AC
  Administered 2013-01-31: 2 g via INTRAVENOUS

## 2013-01-31 MED ORDER — CIPROFLOXACIN IN D5W 400 MG/200ML IV SOLN
400.0000 mg | INTRAVENOUS | Status: AC
  Administered 2013-01-31: 400 mg via INTRAVENOUS
  Filled 2013-01-31: qty 200

## 2013-01-31 MED ORDER — BUPIVACAINE HCL (PF) 0.25 % IJ SOLN
INTRAMUSCULAR | Status: AC
  Filled 2013-01-31: qty 30

## 2013-01-31 MED ORDER — LACTATED RINGERS IV SOLN
INTRAVENOUS | Status: DC
  Administered 2013-01-31 (×5): via INTRAVENOUS

## 2013-01-31 SURGICAL SUPPLY — 66 items
BARRIER ADHS 3X4 INTERCEED (GAUZE/BANDAGES/DRESSINGS) IMPLANT
BENZOIN TINCTURE PRP APPL 2/3 (GAUZE/BANDAGES/DRESSINGS) IMPLANT
BINDER ABD UNIV 10 28-50 (GAUZE/BANDAGES/DRESSINGS) ×8 IMPLANT
BINDER ABDOM UNIV 10 (GAUZE/BANDAGES/DRESSINGS) ×10
CANISTER SUCTION 2500CC (MISCELLANEOUS) ×10 IMPLANT
CATH ROBINSON RED A/P 16FR (CATHETERS) IMPLANT
CATH URET 5FR 28IN OPEN ENDED (CATHETERS) IMPLANT
CELLS DAT CNTRL 66122 CELL SVR (MISCELLANEOUS) IMPLANT
CHLORAPREP W/TINT 26ML (MISCELLANEOUS) ×5 IMPLANT
CLOTH BEACON ORANGE TIMEOUT ST (SAFETY) ×5 IMPLANT
CONT PATH 16OZ SNAP LID 3702 (MISCELLANEOUS) ×5 IMPLANT
COVER TABLE BACK 60X90 (DRAPES) ×5 IMPLANT
DECANTER SPIKE VIAL GLASS SM (MISCELLANEOUS) IMPLANT
DRAPE C-ARM 42X72 X-RAY (DRAPES) ×5 IMPLANT
DRAPE HYSTEROSCOPY (DRAPE) IMPLANT
DRESSING TELFA 8X3 (GAUZE/BANDAGES/DRESSINGS) ×5 IMPLANT
GAUZE SPONGE 4X4 16PLY XRAY LF (GAUZE/BANDAGES/DRESSINGS) ×5 IMPLANT
GLOVE BIO SURGEON STRL SZ7 (GLOVE) ×5 IMPLANT
GLOVE BIOGEL PI IND STRL 7.0 (GLOVE) ×4 IMPLANT
GLOVE BIOGEL PI INDICATOR 7.0 (GLOVE) ×1
GLOVE SURG SS PI 8.0 STRL IVOR (GLOVE) ×5 IMPLANT
GOWN PREVENTION PLUS XLARGE (GOWN DISPOSABLE) ×5 IMPLANT
GOWN STRL REIN XL XLG (GOWN DISPOSABLE) ×15 IMPLANT
GUIDEWIRE STR DUAL SENSOR (WIRE) ×5 IMPLANT
HEMOSTAT SURGICEL 4X8 (HEMOSTASIS) ×5 IMPLANT
NDL SAFETY ECLIPSE 18X1.5 (NEEDLE) ×4 IMPLANT
NEEDLE HYPO 18GX1.5 SHARP (NEEDLE) ×1
NEEDLE HYPO 25X1 1.5 SAFETY (NEEDLE) ×5 IMPLANT
NS IRRIG 1000ML POUR BTL (IV SOLUTION) ×25 IMPLANT
PACK ABDOMINAL GYN (CUSTOM PROCEDURE TRAY) ×5 IMPLANT
PACK VAGINAL WOMENS (CUSTOM PROCEDURE TRAY) IMPLANT
PAD ABD 7.5X8 STRL (GAUZE/BANDAGES/DRESSINGS) ×10 IMPLANT
PAD OB MATERNITY 4.3X12.25 (PERSONAL CARE ITEMS) ×5 IMPLANT
PLUG CATH AND CAP STER (CATHETERS) IMPLANT
PROTECTOR NERVE ULNAR (MISCELLANEOUS) ×5 IMPLANT
RETRACTOR WND ALEXIS 25 LRG (MISCELLANEOUS) IMPLANT
RTRCTR WOUND ALEXIS 18CM MED (MISCELLANEOUS)
RTRCTR WOUND ALEXIS 25CM LRG (MISCELLANEOUS)
SEALER TISSUE X1 CVD JAW (INSTRUMENTS) ×5 IMPLANT
SET CYSTO W/LG BORE CLAMP LF (SET/KITS/TRAYS/PACK) ×5 IMPLANT
SHEET LAVH (DRAPES) ×5 IMPLANT
SPONGE GAUZE 4X4 12PLY (GAUZE/BANDAGES/DRESSINGS) ×5 IMPLANT
SPONGE LAP 18X18 X RAY DECT (DISPOSABLE) ×30 IMPLANT
STAPLER VISISTAT 35W (STAPLE) ×5 IMPLANT
STENT CONTOUR URETERAL (STENTS) IMPLANT
STENT URET 6FRX24 CONTOUR (STENTS) IMPLANT
STRIP CLOSURE SKIN 1/2X4 (GAUZE/BANDAGES/DRESSINGS) IMPLANT
SUT SILK 0 TIES 10X30 (SUTURE) ×5 IMPLANT
SUT VIC AB 0 CT1 18XCR BRD8 (SUTURE) ×12 IMPLANT
SUT VIC AB 0 CT1 27 (SUTURE)
SUT VIC AB 0 CT1 27XBRD ANBCTR (SUTURE) IMPLANT
SUT VIC AB 0 CT1 8-18 (SUTURE) ×3
SUT VIC AB 0 CTX 36 (SUTURE) ×2
SUT VIC AB 0 CTX36XBRD ANBCTRL (SUTURE) ×8 IMPLANT
SUT VIC AB 3-0 CT1 27 (SUTURE) ×2
SUT VIC AB 3-0 CT1 TAPERPNT 27 (SUTURE) ×8 IMPLANT
SUT VIC AB 3-0 SH 27 (SUTURE) ×1
SUT VIC AB 3-0 SH 27X BRD (SUTURE) ×4 IMPLANT
SUT VIC AB 4-0 KS 27 (SUTURE) IMPLANT
SUT VICRYL 0 TIES 12 18 (SUTURE) ×5 IMPLANT
SYR 30ML LL (SYRINGE) ×5 IMPLANT
SYR CONTROL 10ML LL (SYRINGE) ×5 IMPLANT
TAPE CLOTH SURG 4X10 WHT LF (GAUZE/BANDAGES/DRESSINGS) ×5 IMPLANT
TOWEL OR 17X24 6PK STRL BLUE (TOWEL DISPOSABLE) ×20 IMPLANT
TRAY FOLEY CATH 14FR (SET/KITS/TRAYS/PACK) ×5 IMPLANT
WATER STERILE IRR 1000ML POUR (IV SOLUTION) ×5 IMPLANT

## 2013-01-31 NOTE — Op Note (Signed)
NAMEKARYSSA, AMARAL                ACCOUNT NO.:  [id number]  MEDICAL RECORD NO.:  [id number]  LOCATION:  9316                          FACILITY:  [facility]  PHYSICIAN:  Excell Seltzer. Annabell Howells, M.D.    DATE OF BIRTH:  [birth year]  DATE OF PROCEDURE:  01/31/2013 DATE OF DISCHARGE:                              OPERATIVE REPORT   PROCEDURES:  Cystoscopy and insertion of bilateral ureteral catheters.  PREOPERATIVE DIAGNOSIS:  Right hydronephrosis with massive uterine fibroid.  POSTOPERATIVE DIAGNOSIS:  Right hydronephrosis with massive uterine fibroid.  SURGEON:  Excell Seltzer. Annabell Howells, M.D.  ANESTHESIA:  General.  SPECIMEN:  None.  DRAINS:  Bilateral 5-French ureteral catheter and 14-French Foley catheter.  COMPLICATIONS:  None.  INDICATIONS:  Ms. Bradly Bienenstock is a 47 year old Hispanic female with a massive uterine fibroid.  She is to undergo hysterectomy.  She was found on CT to have right ureteral obstruction with parenchymal atrophy from the fibroid.  The left kidney was unremarkable.  Ureteral catheters were requested to aid in ureteral identification.  FINDINGS OF PROCEDURE:  She had been given Ancef and Cipro.  She was taken to the operating room, where general anesthetic was induced.  She was placed in lithotomy position.  Her mons was clipped.  Her genitalia was prepped with Betadine solution, her abdomen with ChloraPrep for the hysterectomy.  She was then draped in the usual sterile fashion.  Cystoscopy was performed using a 22-French scope and 12 and 70 degree lenses.  Examination revealed normal urethra.  The bladder wall was smooth and pale without tumor, stones, or inflammation.  Ureteral tunnels were slightly tented but the ureteral orifices were unremarkable.  The right ureteral orifice was cannulated with 5-French open-end catheter.  Initially there were some resistance few cm above the bladder but the sensor guidewire was then advanced without difficulty to the kidney and the stent  was easily placed over the wire.  The wire was removed.  Fluoroscopy revealed the stent in good position.  The left ureteral orifice was then cannulated with 5-French open-end catheter and stent was advanced to the kidney without difficulty.  The bladder was drained.  The cystoscope was removed leaving the ureteral catheters exiting urethra.  A 14-French Foley catheter was inserted.  The balloon was filled with 10 mL of sterile fluid.  The ureteral catheters were secured to the Foley with 0 silk ties and the ureteral catheters were internalized into the hub of the Foley using 18-gauge needle.  The catheter and stents were then placed to straight drainage.  Dr. Burnice LoganKatrinka Blazing then proceeded with a hysterectomy. There were no complications during my portion of the procedure.     Excell Seltzer. Annabell Howells, M.D.     JJW/MEDQ  D:  01/31/2013  T:  01/31/2013  Job:  [id number]

## 2013-01-31 NOTE — Transfer of Care (Signed)
Immediate Anesthesia Transfer of Care Note  Patient: Cynthia Wiley  Procedure(s) Performed: Procedure(s) with comments: HYSTERECTOMY ABDOMINAL (N/A) BILATERAL SALPINGECTOMY (Bilateral) CYSTOSCOPY WITH STENT PLACEMENT (Bilateral) - with c-arm OOPHORECTOMY (Right)  Patient Location: PACU  Anesthesia Type:General  Level of Consciousness: awake and sedated  Airway & Oxygen Therapy: Patient Spontanous Breathing and Patient connected to nasal cannula oxygen  Post-op Assessment: Report given to PACU RN and Post -op Vital signs reviewed and stable  Post vital signs: Reviewed and stable  Complications: No apparent anesthesia complications

## 2013-01-31 NOTE — Anesthesia Procedure Notes (Signed)
Performed by: Gertie Fey

## 2013-01-31 NOTE — Anesthesia Postprocedure Evaluation (Signed)
Anesthesia Post Note  Patient: Cynthia Wiley  Procedure(s) Performed: Procedure(s) with comments: HYSTERECTOMY ABDOMINAL (N/A) BILATERAL SALPINGECTOMY (Bilateral) CYSTOSCOPY WITH STENT PLACEMENT (Bilateral) - with c-arm OOPHORECTOMY (Right)  Anesthesia type: General  Patient location: [facility] Unit  Post pain: Pain level controlled  Post assessment: Post-op Vital signs reviewed  Last Vitals: BP 108/48  Pulse 96  Temp(Src) 36.9 C (Oral)  Resp 19  Ht 5' (1.524 m)  Wt 151 lb (68.493 kg)  BMI 29.49 kg/m2  SpO2 99%  Post vital signs: Reviewed  Level of consciousness: awake  Complications: No apparent anesthesia complications

## 2013-01-31 NOTE — Op Note (Signed)
Barbaraann Cao [place]  PROCEDURE DATE: 01/31/13  PREOPERATIVE DIAGNOSIS: Symptomatic fibroids, menorrhagia  POSTOPERATIVE DIAGNOSIS: Symptomatic fibroids, menorrhagia  SURGEON: Eber Jones L. Harraway-Smith, M.D.  ASSISTANT: Catalina Antigua, M.D.  OPERATION: Total abdominal hysterectomy, Bilateral Salpingectomy, right oophorectomy Ureteral stents placed prior to procedure ANESTHESIA: General endotracheal.  INDICATIONS: The patient is a 47 y.o. G0 with the aforementioned diagnoses who desires definitive surgical management. On the preoperative visit, the risks, benefits, indications, and alternatives of the procedure were reviewed with the patient. On the day of surgery, the risks of surgery were again discussed with the patient ising an interpreter including but not limited to: bleeding which may require transfusion or reoperation; infection which may require antibiotics; injury to bowel, bladder, ureters or other surrounding organs; need for additional procedures; thromboembolic phenomenon, incisional problems and other postoperative/anesthesia complications. Written informed consent was obtained.  OPERATIVE FINDINGS: A 36+week size uterus with normal tubes and ovaries bilaterally.  SPECIMENS: Uterus,cervix, bilateral fallopian tubes with right ovary sent to pathology  COMPLICATIONS: none.  DESCRIPTION OF PROCEDURE: The patient received intravenous antibiotics and had sequential compression devices applied to her lower extremities while in the preoperative area. She was taken to the operating room and placed under general anesthesia without difficulty. The abdomen and perineum were prepped and draped in a sterile manner, and she was placed in a dorsal supine position. Dr. Wilson Singer placed ureteral stents and a foley catheter prior to the procedure.  After an adequate timeout was performed, a wide transverse skin incision was made. This incision was taken down to the fascia using a scalpel and cautery with care  given to maintain good hemostasis. The rectus muscles were separated using cautery. The peritoneum entered sharply without complication. This peritoneal cavity was entered digitally. Attention was then turned to the pelvis. The uterus at this point was noted to be mobilized and was delivered up out of the abdomen. The bowel was packed away with moist laparotomy sponges. There were multiple adhesion to the uterus which were removed with sharp dissection.  The round ligaments on each side were clamped, suture ligated with 0 Vicryl, and transected with electrocautery allowing entry into the broad ligament. Of note, all sutures used in this procedure are 0 Vicryl unless otherwise noted. The anterior and posterior leaves of the broad ligament were separated, and the ureters with the stents were inspected to be safely away from the area of dissection bilaterally. The infundibulopelvic ligaments were bliaterally clamped and doubly suture ligated. The uterine arteries were skeletinized bilaterally A bladder flap was then created. The bladder was then bluntly dissected off the lower uterine segment and cervix with good hemostasis noted. The uterine arteries were then skeletonized bilaterally and then clamped, cut, and doubly suture ligated with care given to prevent ureteral injury. After the uterine arteries were clamped, the uterus was removed with a scalpel and the cervical stump was grasped with a Jacob's tenaculum. The uterosacral ligaments were then clamped, cut, and ligated bilaterally. Finally, the cardinal ligaments were clamped, cut, and ligated bilaterally. Using [employer], the uterus was removed.  The cuff angles were closed with Heaney stiches with care given to incorporate the uterosacral-cardinal ligament pedicles on both sides. The middle of the vaginal cuff was closed with a series of interrupted figure-of-eight sutures with care given to incorporate the anterior pubocervical fascia and the  posterior rectovaginal fascia. The pelvis was irrigated and hemostasis was reconfirmed at all pedicles and along the pelvic sidewall. The ureters were inspected and noted to be peristalsing  bilaterally. Imterceed was placed across the ureter.  All laparotomy sponges and instruments were removed from the abdomen. The fascia, rectus muscles and peritoneum were closed in a mass fashion using an 0 Vicryl suture in a running fashion. The skin was closed with staples. Sponge, lap, needle, and instrument counts were correct times two. The patient was taken to the recovery area awake, extubated and in stable condition.

## 2013-01-31 NOTE — Anesthesia Postprocedure Evaluation (Signed)
  Anesthesia Post Note  Patient: Cynthia Wiley  Procedure(s) Performed: Procedure(s) (LRB): HYSTERECTOMY ABDOMINAL (N/A) BILATERAL SALPINGECTOMY (Bilateral) CYSTOSCOPY WITH STENT PLACEMENT (Bilateral) OOPHORECTOMY (Right)  Anesthesia type: GA  Patient location: PACU  Post pain: Pain level controlled  Post assessment: Post-op Vital signs reviewed  Last Vitals:  Filed Vitals:   01/31/13 0813  BP: 153/67  Pulse: 78  Temp: 36.7 C  Resp: 18    Post vital signs: Reviewed  Level of consciousness: sedated  Complications: No apparent anesthesia complications

## 2013-01-31 NOTE — Anesthesia Preprocedure Evaluation (Signed)
Anesthesia Evaluation  Patient identified by MRN, date of birth, ID band Patient awake    Reviewed: Allergy & Precautions, H&P , Patient's Chart, lab work & pertinent test results, reviewed documented beta blocker date and time   History of Anesthesia Complications Negative for: history of anesthetic complications  Airway Mallampati: II TM Distance: >3 FB Neck ROM: full    Dental no notable dental hx.    Pulmonary neg pulmonary ROS,  breath sounds clear to auscultation  Pulmonary exam normal       Cardiovascular Exercise Tolerance: Good negative cardio ROS  Rhythm:regular Rate:Normal     Neuro/Psych negative neurological ROS  negative psych ROS   GI/Hepatic negative GI ROS, Neg liver ROS,   Endo/Other  negative endocrine ROSdiabetesHypothyroidism   Renal/GU negative Renal ROS     Musculoskeletal   Abdominal   Peds  Hematology negative hematology ROS (+)   Anesthesia Other Findings Diabetes mellitus     Thyroid disease        Pelvic pain in female     Hypothyroidism           Reproductive/Obstetrics negative OB ROS                           Anesthesia Physical Anesthesia Plan  ASA: III  Anesthesia Plan: General ETT   Post-op Pain Management:    Induction:   Airway Management Planned:   Additional Equipment:   Intra-op Plan:   Post-operative Plan:   Informed Consent: I have reviewed the patients History and Physical, chart, labs and discussed the procedure including the risks, benefits and alternatives for the proposed anesthesia with the patient or authorized representative who has indicated his/her understanding and acceptance.   Dental Advisory Given  Plan Discussed with: CRNA and Surgeon  Anesthesia Plan Comments:         Anesthesia Quick Evaluation

## 2013-01-31 NOTE — H&P (Signed)
Cynthia Wiley is an 47 y.o. female. Pt with h/o large uterine fibroids causing pain also complicated by bilateral hydronephrosis.  Pertinent Gynecological History: Menses: flow is moderate Bleeding: heavy/regular- no intermenstrual bleeding Contraception: none DES exposure: unknown Blood transfusions: none Sexually transmitted diseases: no past history Previous GYN Procedures: none   Last pap: normal Date: 12/13 OB History: G0, P0   Menstrual History: Menarche age: 68  No LMP recorded.    Past Medical History  Diagnosis Date  . Diabetes mellitus   . Thyroid disease   . Pelvic pain in female   . Hypothyroidism     Past Surgical History  Procedure Laterality Date  . No past surgeries      Family History  Problem Relation Age of Onset  . Diabetes Father   . Heart disease Father   . Diabetes Sister   . Hyperlipidemia Brother     Social History:  reports that she has never smoked. She has never used smokeless tobacco. She reports that she does not drink alcohol or use illicit drugs.  Allergies: No Known Allergies  Prescriptions prior to admission  Medication Sig Dispense Refill  . levothyroxine (SYNTHROID, LEVOTHROID) 50 MCG tablet Take 50 mcg by mouth daily.      . metFORMIN (GLUCOPHAGE) 850 MG tablet Take 850 mg by mouth 2 (two) times daily with a meal.      . loratadine (CLARITIN) 10 MG tablet Take 10 mg by mouth daily.        ROS  Blood pressure 153/67, pulse 78, temperature 98.1 F (36.7 C), temperature source Oral, resp. rate 18, SpO2 100.00%. Physical Exam  Results for orders placed during the hospital encounter of 01/31/13 (from the past 24 hour(s))  PREGNANCY, URINE     Status: None   Collection Time    01/31/13  7:40 AM      Result Value Range   Preg Test, Ur NEGATIVE  NEGATIVE  GLUCOSE, CAPILLARY     Status: Abnormal   Collection Time    01/31/13  8:46 AM      Result Value Range   Glucose-Capillary 112 (*) 70 - 99 mg/dL    No results  found.  Assessment/Plan: Pt for TAH with bilateral salpingectomy with bilateral stent placement prior to procedure.  Informed consent obtained.  Reviewed with pt and mother risks and indications with Spanish interpreter.  HARRAWAY-SMITH, Jary Louvier 01/31/2013, 9:10 AM

## 2013-01-31 NOTE — Brief Op Note (Signed)
01/31/2013  12:55 PM  PATIENT:  Cynthia Wiley  47 y.o. female  PRE-OPERATIVE DIAGNOSIS:  ZOX#[id number] Large fibroid uterus and desires sterilization  POST-OPERATIVE DIAGNOSIS:  Large fibroid uterus and desires sterilization  PROCEDURE:  Procedure(s) with comments: HYSTERECTOMY ABDOMINAL (N/A) BILATERAL SALPINGECTOMY (Bilateral) CYSTOSCOPY WITH STENT PLACEMENT (Bilateral) - with c-arm OOPHORECTOMY (Right)  SURGEON:  Surgeon(s) and Role: Panel 1:    * Willodean Rosenthal, MD - Primary    * Catalina Antigua, MD - Assisting  Panel 2:    * Anner Crete, MD - Primary  ANESTHESIA:   general  EBL:  Total I/O In: 3900 [I.V.:3800; IV Piggyback:100] Out: 1480 [Urine:280; Blood:1200]  BLOOD ADMINISTERED:none  DRAINS: none   LOCAL MEDICATIONS USED:  MARCAINE     SPECIMEN:  Source of Specimen:  uterus, fallopian tubes and right ovary  DISPOSITION OF SPECIMEN:  PATHOLOGY  COUNTS:  YES  TOURNIQUET:  * No tourniquets in log *  DICTATION: .Note written in EPIC  PLAN OF CARE: Admit to inpatient   PATIENT DISPOSITION:  PACU - hemodynamically stable.   Delay start of Pharmacological VTE agent (>24hrs) due to surgical blood loss or risk of bleeding: yes

## 2013-02-01 ENCOUNTER — Encounter (HOSPITAL_COMMUNITY): Payer: Self-pay | Admitting: Obstetrics & Gynecology

## 2013-02-01 LAB — CBC
Platelets: 179 10*3/uL (ref 150–400)
RBC: 2.84 MIL/uL — ABNORMAL LOW (ref 3.87–5.11)
RDW: 16.5 % — ABNORMAL HIGH (ref 11.5–15.5)
WBC: 11.9 10*3/uL — ABNORMAL HIGH (ref 4.0–10.5)

## 2013-02-01 LAB — BASIC METABOLIC PANEL
CO2: 26 mEq/L (ref 19–32)
Calcium: 6.9 mg/dL — ABNORMAL LOW (ref 8.4–10.5)
Creatinine, Ser: 0.87 mg/dL (ref 0.50–1.10)
GFR calc Af Amer: >90 mL/min (ref >90)
GFR calc non Af Amer: 79 mL/min — ABNORMAL LOW (ref >90)
Sodium: 134 mEq/L — ABNORMAL LOW (ref 135–145)

## 2013-02-01 LAB — TYPE AND SCREEN

## 2013-02-01 MED ORDER — IBUPROFEN 600 MG PO TABS
600.0000 mg | ORAL_TABLET | Freq: Four times a day (QID) | ORAL | Status: DC | PRN
  Administered 2013-02-01 – 2013-02-02 (×4): 600 mg via ORAL
  Filled 2013-02-01 (×4): qty 1

## 2013-02-01 MED ORDER — FERROUS SULFATE 325 (65 FE) MG PO TABS: 325.0000 mg | ORAL_TABLET | Freq: Two times a day (BID) | ORAL | Status: DC

## 2013-02-01 MED ORDER — IBUPROFEN 600 MG PO TABS: 600.0000 mg | ORAL_TABLET | Freq: Four times a day (QID) | ORAL | Status: DC | PRN

## 2013-02-01 MED ORDER — OXYCODONE-ACETAMINOPHEN 5-325 MG PO TABS
1.0000 | ORAL_TABLET | ORAL | Status: DC | PRN
Start: 2013-02-01 — End: 2015-01-22

## 2013-02-01 NOTE — Progress Notes (Signed)
1 Day Post-Op Procedure(s) (LRB): HYSTERECTOMY ABDOMINAL (N/A) BILATERAL SALPINGECTOMY (Bilateral) CYSTOSCOPY WITH STENT PLACEMENT (Bilateral) OOPHORECTOMY (Right)  Subjective: Patient reports: no problems.  Adequate pain relief with Morphine PCA.  No flatus.  Feel thirsty.  Has not ambulated.    S/p transfusion of 2 units of PRBC's overnight.  Objective:BP 108/60  Pulse 86  Temp(Src) 98.5 F (36.9 C) (Oral)  Resp 18  Ht 5' (1.524 m)  Wt 68.493 kg (151 lb)  BMI 29.49 kg/m2  SpO2 96%  I have reviewed patient's vital signs, intake and output, medications and labs.  General: alert and mild distress Resp: clear to auscultation bilaterally Cardio: regular rate and rhythm, S1, S2 normal, no murmur, click, rub or gallop GI: soft, non-tender; bowel sounds normal; no masses,  no organomegaly and incision: dressing in place and dry Extremities: extremities normal, atraumatic, no cyanosis or edema CBC    Component Value Date/Time   WBC 11.9* 02/01/2013 0515   RBC 2.84* 02/01/2013 0515   HGB 8.0* 02/01/2013 0515   HCT 23.1* 02/01/2013 0515   PLT 179 02/01/2013 0515   MCV 81.3 02/01/2013 0515   MCH 28.2 02/01/2013 0515   MCHC 34.6 02/01/2013 0515   RDW 16.5* 02/01/2013 0515   LYMPHSABS 2.4 01/06/2009 2230   MONOABS 0.6 01/06/2009 2230   EOSABS 0.1 01/06/2009 2230   BASOSABS 0.0 01/06/2009 2230     Assessment: s/p Procedure(s) with comments: HYSTERECTOMY ABDOMINAL (N/A) BILATERAL SALPINGECTOMY (Bilateral) CYSTOSCOPY WITH STENT PLACEMENT (Bilateral) - with c-arm OOPHORECTOMY (Right): stable, progressing well and anemia  Plan: Advance diet Encourage ambulation Clear liquids discontinue foley D/c Morpine PCA Anticipate d/c in am   LOS: 1 day    HARRAWAY-SMITH, B[place]on [place] 02/01/2013, 10:34 AM

## 2013-02-02 LAB — BASIC METABOLIC PANEL
Chloride: 106 mEq/L (ref 96–112)
GFR calc Af Amer: >90 mL/min (ref >90)
GFR calc non Af Amer: 87 mL/min — ABNORMAL LOW (ref >90)
Potassium: 3.6 mEq/L (ref 3.5–5.1)
Sodium: 139 mEq/L (ref 135–145)

## 2013-02-02 LAB — CBC
Hemoglobin: 7.5 g/dL — ABNORMAL LOW (ref 12.0–15.0)
MCHC: 32.9 g/dL (ref 30.0–36.0)
RDW: 16.9 % — ABNORMAL HIGH (ref 11.5–15.5)
WBC: 11.6 10*3/uL — ABNORMAL HIGH (ref 4.0–10.5)

## 2013-02-02 MED ORDER — DOCUSATE SODIUM 100 MG PO CAPS: 100.0000 mg | ORAL_CAPSULE | Freq: Two times a day (BID) | ORAL | Status: DC | PRN

## 2013-02-02 NOTE — Progress Notes (Signed)
Discharge instructions reviewed with patient per Shanda Bumps the interpreter.  Patient states understanding of home care, signs/symptoms to seek help for or report to MD, medications, activity and return MD office visit.  No home equipment needed.  Patient discharged via wheelchair with staff without incident in stable condition.

## 2013-02-02 NOTE — Discharge Summary (Signed)
Physician Discharge Summary  Patient ID: Cynthia Wiley MRN: [id number] DOB/AGE: 06/17/1966 47 y.o.  Admit date: 01/31/2013 Discharge date: 02/02/2013  Admission Diagnoses: Scheduled hysterectomy with bilateral salpingectomy secondary to enlarged fibroid uterus  Discharge Diagnoses: same Active Problems:   Fibroids   Discharged Condition: good  Hospital Course: Patient with 30-week size fibroid uterus and hydronephrosis secondary to mass effect,who underwent anabdominal hysterectomy and bilateral salpingectomy. Due to large EBL, patient was transfused 2 units pRBC in the recovery room. The remainder of her post-op course has been uncomplicated. Patient was ambulating, tolerating po intake and had flatus. She was found stable for discharge on POD#2. Discharge instructions provided  Consults: None  Significant Diagnostic Studies: labs: pre-op Hg 13.2, immediately post-op 5.8, s/p 2 units pRBC 8 and 7.5  Treatments: surgery: abdominal hysterectomy with bilateral salpingooophorectomy and blood transfusion  Discharge Exam: Blood pressure 108/50, pulse 86, temperature 98.2 F (36.8 C), temperature source Oral, resp. rate 16, height 5' (1.524 m), weight 151 lb (68.493 kg), SpO2 98.00%. General appearance: alert, cooperative and no distress Resp: clear to auscultation bilaterally Cardio: regular rate and rhythm GI: soft, non-tender; bowel sounds normal; no masses,  no organomegaly Pelvic: deferred Extremity: NT, equal in size, no edemal  Disposition: 50-Hospice/Home  Discharge Orders   Future Appointments Provider Department Dept Phone   02/20/2013 2:00 PM Willodean Rosenthal, MD [facility] [phone]   Future Orders Complete By Expires     Call MD for:  extreme fatigue  As directed     Call MD for:  persistant dizziness or light-headedness  As directed     Call MD for:  persistant nausea and vomiting  As directed     Call MD for:  redness, tenderness, or signs  of infection (pain, swelling, redness, odor or green/yellow discharge around incision site)  As directed     Call MD for:  severe uncontrolled pain  As directed     Call MD for:  temperature >100.4  As directed     Call MD for:  As directed     Scheduling Instructions:      Excessive vaginal bleeding    Diet - low sodium heart healthy  As directed     Discharge wound care:  As directed     Comments:      Keep clean and dry.  May remove steri-strips after 2 weeks if the remain in place    Driving Restrictions  As directed     Comments:      None for 2 weeks or off all pain meds    Increase activity slowly  As directed     Lifting restrictions  As directed     Comments:      No lifting over 20# for 4 weeks    Sexual Activity Restrictions  As directed     Comments:      Nothing in vagina for 6 weeks        Medication List    TAKE these medications       docusate sodium 100 MG capsule  Commonly known as:  COLACE  Take 1 capsule (100 mg total) by mouth 2 (two) times daily as needed for constipation.     ferrous sulfate 325 (65 FE) MG tablet  Commonly known as:  FERROUSUL  Take 1 tablet (325 mg total) by mouth 2 (two) times daily.     ibuprofen 600 MG tablet  Commonly known as:  ADVIL,MOTRIN  Take 1 tablet (600  mg total) by mouth every 6 (six) hours as needed.     levothyroxine 50 MCG tablet  Commonly known as:  SYNTHROID, LEVOTHROID  Take 50 mcg by mouth daily.     loratadine 10 MG tablet  Commonly known as:  CLARITIN  Take 10 mg by mouth daily.     metFORMIN 850 MG tablet  Commonly known as:  GLUCOPHAGE  Take 850 mg by mouth 2 (two) times daily with a meal.     oxyCODONE-acetaminophen 5-325 MG per tablet  Commonly known as:  PERCOCET/ROXICET  Take 1-2 tablets by mouth every 4 (four) hours as needed.           Follow-up Information   Follow up with Willodean Rosenthal, MD In 2 weeks.   Contact information:   [address] [place]  [address] [phone]       Signed: Catalina Antigua 02/02/2013, 7:14 AM

## 2013-02-05 ENCOUNTER — Encounter: Payer: Self-pay | Admitting: *Deleted

## 2013-02-20 ENCOUNTER — Encounter: Payer: Self-pay | Admitting: Obstetrics & Gynecology

## 2013-02-20 ENCOUNTER — Ambulatory Visit (INDEPENDENT_AMBULATORY_CARE_PROVIDER_SITE_OTHER): Payer: Self-pay | Admitting: Obstetrics & Gynecology

## 2013-02-20 VITALS — BP 146/85 | HR 91 | Temp 97.4°F | Ht 59.0 in | Wt 138.5 lb

## 2013-02-20 DIAGNOSIS — Z09 Encounter for follow-up examination after completed treatment for conditions other than malignant neoplasm: Secondary | ICD-10-CM

## 2013-02-20 DIAGNOSIS — Z9889 Other specified postprocedural states: Secondary | ICD-10-CM

## 2013-02-20 MED ORDER — IBUPROFEN 600 MG PO TABS: 600.0000 mg | ORAL_TABLET | Freq: Four times a day (QID) | ORAL | Status: DC | PRN

## 2013-02-20 NOTE — Progress Notes (Signed)
Subjective:     Patient ID: Cynthia Wiley, female   DOB: [birth year], 47 y.o.   MRN: [id number]  HPI pt presents for f/u of TAH/with bilateral salpingectomy.  She is doing well and has no complaints.  She is voiding and stooling without difficulty.  She is out of Percocet but, has been taking motrin with adequate pain relief.      Review of Systems     Objective:   Physical Exam BP 146/85  Pulse 91  Temp(Src) 97.4 F (36.3 C) (Oral)  Ht 4\' 11"  (1.499 m)  Wt 138 lb 8 oz (62.823 kg)  BMI 27.96 kg/m2  LMP 01/19/2013  Abd: soft, NT, ND; incision clean and dry. Healing without difficulty  01/31/2013 Diagnosis Uterus and bilateral fallopian tubes, with cervix and right ovary - SLIGHT CERVICITIS. - ENDOMETRIUM: PROLIFERATIVE. - MYOMETRIUM: MULTIPLE LEIOMYOMATA WITH FOCAL DEGENERATIVE CHANGES AND CALCIFICATIONS. - RIGHT AND LEFT FALLOPIAN TUBES: UNREMARKABLE WITH BENIGN PARATUBAL CYSTS.     Assessment:     2 week post op check doing well      Plan:     F/u 3-4 weeks or sooner prn Motrin 600mg  q 6 hours prn pain Keep wound clean and dry.

## 2013-02-20 NOTE — Patient Instructions (Addendum)
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 Estas indicaciones le proporcionan informacin general acerca de cmo deber cuidarse despus del procedimiento. El mdico tambin podr darle instrucciones especficas. El tratamiento ha sido planificado segn las prcticas mdicas actuales, pero en algunos casos pueden ocurrir problemas. Comunquese con el mdico si tiene algn problema o tiene preguntas despus del procedimiento.  INSTRUCCIONES PARA EL CUIDADO DOMICILIARIO La curacin puede demorar algn tiempo. Puede sentir molestias, sensibilidad, hinchazn y hematomas en el sitio de la operacin, durante aproximadamente 2 semanas. Esto es normal y Scientist, clinical (histocompatibility and immunogenetics) a medida que pase el [place].   Slo tome medicamentos de venta libre o recetados para Primary school teacher, las molestias o bajar la fiebre segn las indicaciones de su mdico.  No tome aspirina. Esta puede ocasionar hemorragias.  No conduzca mientras toma analgsicos.  Siga las indicaciones de su mdico con respecto a la actividad fsica, a Lexicographer objetos, conducir el automvil y para las actividades en general.  Reanude su dieta habitual, segn las indicaciones y permisos.  Descanse y duerma lo suficiente.  No utilice tampones, duchas vaginales ni tenga relaciones sexuales durante al menos 6 semanas o hasta que el profesional la autorice.  Cambie el vendaje tal como se le indic.  Controle su temperatura.  Tome duchas en lugar de baos durante 2 a 3 semanas.  No beba alcohol hasta que el mdico la autorice.  Si est constipada, tome un laxante suave, si el mdico la autoriza. Los alimentos que contengan salvado la ayudarn para el problema de la constipacin. Debe ingerir gran cantidad de lquido para mantener la orina de tono claro o color amarillo plido.  Trate de que alguien la acompae en su casa durante una o [place], para ayudarla con los quehaceres  domsticos.  Cumpla con todas las visitas de control, segn le indique su mdico. SOLICITE ATENCIN MDICA SI:   Hay hinchazn, enrojecimiento o aumenta el dolor en la zona del corte quirrgico (incisin).  Tiene pus en el sitio de la incisin.  Advierte un olor ftido que proviene de la herida o del vendaje.  Siente dolor u observa enrojecimiento e hinchazn en el sitio de la va intravenosa.  La herida se abre.  Se siente mareado o sufre un desmayo.  Siente dolor o tiene una hemorragia al [employer].  Tiene diarrea persistente.  Tiene nuseas o vmitos persistentes.  Tiene flujo vaginal anormal.  Tiene una erupcin.  Tiene alguna reaccin anormal o aparece una alergia por los medicamentos.  El dolor no se alivia con los medicamentos recetados. SOLICITE ATENCIN MDICA DE INMEDIATO SI:  Tiene fiebre.  Siente un dolor abdominal intenso.  Siente dolor en el pecho.  Le falta el aire.  Se desmaya.  Siente dolor, u observa hinchazn o enrojecimiento en la pierna.  Tiene una hemorragia vaginal abundante, con cogulos. ASEGRESE DE QUE:   Comprende estas instrucciones.  Controlar su enfermedad.  Solicitar ayuda de inmediato si no mejora o si empeora. Document Released: 06/04/2007 Document Revised: 02/13/2012 [facility] Patient Information 2013 [place], [place].

## 2013-03-20 ENCOUNTER — Ambulatory Visit (INDEPENDENT_AMBULATORY_CARE_PROVIDER_SITE_OTHER): Payer: Self-pay | Admitting: Obstetrics & Gynecology

## 2013-03-20 ENCOUNTER — Encounter: Payer: Self-pay | Admitting: Obstetrics & Gynecology

## 2013-03-20 VITALS — BP 129/80 | HR 87 | Temp 97.8°F | Ht <= 58 in | Wt 141.2 lb

## 2013-03-20 DIAGNOSIS — Z09 Encounter for follow-up examination after completed treatment for conditions other than malignant neoplasm: Secondary | ICD-10-CM

## 2013-03-20 DIAGNOSIS — Z9889 Other specified postprocedural states: Secondary | ICD-10-CM

## 2013-03-20 NOTE — Patient Instructions (Signed)
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 Estas indicaciones le proporcionan informacin general acerca de cmo deber cuidarse despus del procedimiento. El mdico tambin podr darle instrucciones especficas. El tratamiento ha sido planificado segn las prcticas mdicas actuales, pero en algunos casos pueden ocurrir problemas. Comunquese con el mdico si tiene algn problema o tiene preguntas despus del procedimiento.  INSTRUCCIONES PARA EL CUIDADO DOMICILIARIO La curacin puede demorar algn tiempo. Puede sentir molestias, sensibilidad, hinchazn y hematomas en el sitio de la operacin, durante aproximadamente 2 semanas. Esto es normal y mejorar a medida que pase el tiempo.   Slo tome medicamentos de venta libre o recetados para calmar el dolor, las molestias o bajar la fiebre segn las indicaciones de su mdico.  No tome aspirina. Esta puede ocasionar hemorragias.  No conduzca mientras toma analgsicos.  Siga las indicaciones de su mdico con respecto a la actividad fsica, a levantar objetos, conducir el automvil y para las actividades en general.  Reanude su dieta habitual, segn las indicaciones y permisos.  Descanse y duerma lo suficiente.  No utilice tampones, duchas vaginales ni tenga relaciones sexuales durante al menos 6 semanas o hasta que el profesional la autorice.  Cambie el vendaje tal como se le indic.  Controle su temperatura.  Tome duchas en lugar de baos durante 2 a 3 semanas.  No beba alcohol hasta que el mdico la autorice.  Si est constipada, tome un laxante suave, si el mdico la autoriza. Los alimentos que contengan salvado la ayudarn para el problema de la constipacin. Debe ingerir gran cantidad de lquido para mantener la orina de tono claro o color amarillo plido.  Trate de que alguien la acompae en su casa durante una o dos semanas, para ayudarla con los quehaceres  domsticos.  Cumpla con todas las visitas de control, segn le indique su mdico. SOLICITE ATENCIN MDICA SI:   Hay hinchazn, enrojecimiento o aumenta el dolor en la zona del corte quirrgico (incisin).  Tiene pus en el sitio de la incisin.  Advierte un olor ftido que proviene de la herida o del vendaje.  Siente dolor u observa enrojecimiento e hinchazn en el sitio de la va intravenosa.  La herida se abre.  Se siente mareado o sufre un desmayo.  Siente dolor o tiene una hemorragia al orinar.  Tiene diarrea persistente.  Tiene nuseas o vmitos persistentes.  Tiene flujo vaginal anormal.  Tiene una erupcin.  Tiene alguna reaccin anormal o aparece una alergia por los medicamentos.  El dolor no se alivia con los medicamentos recetados. SOLICITE ATENCIN MDICA DE INMEDIATO SI:  Tiene fiebre.  Siente un dolor abdominal intenso.  Siente dolor en el pecho.  Le falta el aire.  Se desmaya.  Siente dolor, u observa hinchazn o enrojecimiento en la pierna.  Tiene una hemorragia vaginal abundante, con cogulos. ASEGRESE DE QUE:   Comprende estas instrucciones.  Controlar su enfermedad.  Solicitar ayuda de inmediato si no mejora o si empeora. Document Released: 06/04/2007 Document Revised: 02/13/2012 [employer] Patient Information 2013 [employer].  

## 2013-03-20 NOTE — Progress Notes (Signed)
Subjective:     Patient ID: Cynthia Wiley, female   DOB: 02/12/1966, 47 y.o.   MRN: [id number]  HPI Pt presents for her 6 weeks post op check.  She is doing well.  No problems with passage of stool or urine.  Good pain control.  Gradually increasing activity.    Review of Systems     Objective:   Physical Exam BP 129/80  Pulse 87  Temp(Src) 97.8 F (36.6 C) (Oral)  Ht 4\' 10"  (1.473 m)  Wt 141 lb 3.2 oz (64.048 kg)  BMI 29.52 kg/m2  LMP 01/19/2013  Abd: soft; incision:  Clean dry and intact GU: EGBUS: no lesions Vagina: no blood in vault; cuff well healed        Assessment:     6 weeks post op check- doing well       Plan:     F/u 3 months or sooner prn RTW and full activity   History obtained through interpreter

## 2013-06-20 ENCOUNTER — Encounter: Payer: Self-pay | Admitting: Obstetrics & Gynecology

## 2013-06-20 ENCOUNTER — Ambulatory Visit (INDEPENDENT_AMBULATORY_CARE_PROVIDER_SITE_OTHER): Payer: Self-pay | Admitting: Obstetrics & Gynecology

## 2013-06-20 VITALS — BP 120/74 | HR 87 | Temp 97.5°F | Ht 61.0 in | Wt 154.9 lb

## 2013-06-20 DIAGNOSIS — N898 Other specified noninflammatory disorders of vagina: Secondary | ICD-10-CM

## 2013-06-20 DIAGNOSIS — Z9889 Other specified postprocedural states: Secondary | ICD-10-CM

## 2013-06-20 MED ORDER — IBUPROFEN 600 MG PO TABS: 600.0000 mg | ORAL_TABLET | Freq: Four times a day (QID) | ORAL | Status: DC | PRN

## 2013-06-20 NOTE — Progress Notes (Signed)
GYNECOLOGY CLINIC PROGRESS NOTE  History:  47 y.o. G0 here today reporting vaginal discharge. Patient is Spanish-speaking only, Spanish interpreter present for this encounter.  She reports yellow discharge, itchy, no odor x 2 weeks.  She is s/p TAH, BSO on 01/31/13, no complications, benign pathology. Also reports low back pain.  The following portions of the patient's history were reviewed and updated as appropriate: allergies, current medications, past family history, past medical history, past social history, past surgical history and problem list.  Review of Systems:  Pertinent items are noted in HPI.  Objective:  Physical Exam BP 120/74  Pulse 87  Temp(Src) 97.5 F (36.4 C) (Oral)  Ht 5\' 1"  (1.549 m)  Wt 154 lb 14.4 oz (70.262 kg)  BMI 29.28 kg/m2  LMP 01/19/2013 Gen: NAD Abd: Soft, nontender and nondistended, well-healed incision Pelvic: Normal appearing external genitalia; normal appearing vaginal mucosa and cervix. Small amount of discharge seen, wet prep obtained  Assessment & Plan:   Will follow up results and manage accordingly. Follow up with PCP for low back pain

## 2013-06-20 NOTE — Patient Instructions (Signed)
Regrese a la clinica cuando tenga su cita. Si tiene problemas o preguntas, llama a la clinica o vaya a la sala de emergencia al [facility].    

## 2013-06-21 LAB — WET PREP, GENITAL
Clue Cells Wet Prep HPF POC: NONE SEEN
Trich, Wet Prep: NONE SEEN

## 2013-06-27 ENCOUNTER — Telehealth: Payer: Self-pay | Admitting: General Practice

## 2013-06-27 NOTE — Telephone Encounter (Signed)
Patient called in up front and wanted her results from last Thursday. Called patient with julie for interpreter and informed her of 7/17 results and recommendations from Dr Macon Large. Patient verbalized understanding and had no further questions

## 2014-07-25 ENCOUNTER — Other Ambulatory Visit (HOSPITAL_COMMUNITY): Payer: Self-pay | Admitting: Geriatric Medicine

## 2014-07-25 DIAGNOSIS — Z1231 Encounter for screening mammogram for malignant neoplasm of breast: Secondary | ICD-10-CM

## 2014-08-07 ENCOUNTER — Ambulatory Visit (HOSPITAL_COMMUNITY)
Admission: RE | Admit: 2014-08-07 | Discharge: 2014-08-07 | Disposition: A | Discharge status: Home / Self Care | Payer: MEDICAID | Source: Ambulatory Visit | Attending: Geriatric Medicine | Admitting: Geriatric Medicine

## 2014-08-07 DIAGNOSIS — Z1231 Encounter for screening mammogram for malignant neoplasm of breast: Secondary | ICD-10-CM

## 2015-01-22 ENCOUNTER — Ambulatory Visit: Payer: Self-pay | Attending: Family Medicine | Admitting: Family Medicine

## 2015-01-22 ENCOUNTER — Encounter: Payer: Self-pay | Admitting: Family Medicine

## 2015-01-22 VITALS — BP 127/77 | HR 77 | Temp 97.8°F | Resp 16 | Ht 60.0 in | Wt 160.0 lb

## 2015-01-22 DIAGNOSIS — Z90721 Acquired absence of ovaries, unilateral: Secondary | ICD-10-CM

## 2015-01-22 DIAGNOSIS — E559 Vitamin D deficiency, unspecified: Secondary | ICD-10-CM

## 2015-01-22 DIAGNOSIS — E119 Type 2 diabetes mellitus without complications: Secondary | ICD-10-CM | POA: Insufficient documentation

## 2015-01-22 DIAGNOSIS — E118 Type 2 diabetes mellitus with unspecified complications: Secondary | ICD-10-CM

## 2015-01-22 DIAGNOSIS — E039 Hypothyroidism, unspecified: Secondary | ICD-10-CM | POA: Insufficient documentation

## 2015-01-22 DIAGNOSIS — Z90710 Acquired absence of both cervix and uterus: Secondary | ICD-10-CM

## 2015-01-22 DIAGNOSIS — Z0000 Encounter for general adult medical examination without abnormal findings: Secondary | ICD-10-CM

## 2015-01-22 LAB — COMPLETE METABOLIC PANEL WITH GFR
ALBUMIN: 4.1 g/dL (ref 3.5–5.2)
ALK PHOS: 112 U/L (ref 39–117)
ALT: 12 U/L (ref 0–35)
AST: 12 U/L (ref 0–37)
BILIRUBIN TOTAL: 0.5 mg/dL (ref 0.2–1.2)
BUN: 12 mg/dL (ref 6–23)
CALCIUM: 10.1 mg/dL (ref 8.4–10.5)
CHLORIDE: 100 meq/L (ref 96–112)
CO2: 26 mEq/L (ref 19–32)
CREATININE: 0.74 mg/dL (ref 0.50–1.10)
GFR, Est Non African American: >89 mL/min
Glucose, Bld: 236 mg/dL — ABNORMAL HIGH (ref 70–99)
Potassium: 5.1 mEq/L (ref 3.5–5.3)
Sodium: 134 mEq/L — ABNORMAL LOW (ref 135–145)
Total Protein: 7.3 g/dL (ref 6.0–8.3)

## 2015-01-22 LAB — CBC
HEMATOCRIT: 39.7 % (ref 36.0–46.0)
Hemoglobin: 12.7 g/dL (ref 12.0–15.0)
MCH: 26.8 pg (ref 26.0–34.0)
MCHC: 32 g/dL (ref 30.0–36.0)
MCV: 83.9 fL (ref 78.0–100.0)
MPV: 10.7 fL (ref 8.6–12.4)
Platelets: 366 10*3/uL (ref 150–400)
RBC: 4.73 MIL/uL (ref 3.87–5.11)
RDW: 14.9 % (ref 11.5–15.5)
WBC: 7.1 10*3/uL (ref 4.0–10.5)

## 2015-01-22 LAB — LIPID PANEL
CHOL/HDL RATIO: 5.9 ratio
CHOLESTEROL: 260 mg/dL — AB (ref 0–200)
HDL: 44 mg/dL (ref >39)
LDL Cholesterol: 163 mg/dL — ABNORMAL HIGH (ref 0–99)
Triglycerides: 267 mg/dL — ABNORMAL HIGH (ref <150)
VLDL: 53 mg/dL — ABNORMAL HIGH (ref 0–40)

## 2015-01-22 LAB — GLUCOSE, POCT (MANUAL RESULT ENTRY): POC GLUCOSE: 219 mg/dL — AB (ref 70–99)

## 2015-01-22 LAB — VITAMIN B12: Vitamin B-12: 258 pg/mL (ref 211–911)

## 2015-01-22 LAB — TSH: TSH: 2.637 u[IU]/mL (ref 0.350–4.500)

## 2015-01-22 LAB — POCT GLYCOSYLATED HEMOGLOBIN (HGB A1C): HEMOGLOBIN A1C: 11.1

## 2015-01-22 MED ORDER — SITAGLIPTIN PHOSPHATE 100 MG PO TABS: 100.0000 mg | ORAL_TABLET | Freq: Every day | ORAL | Status: DC

## 2015-01-22 MED ORDER — METFORMIN HCL 1000 MG PO TABS: 1000.0000 mg | ORAL_TABLET | Freq: Two times a day (BID) | ORAL | Status: DC

## 2015-01-22 MED ORDER — LEVOTHYROXINE SODIUM 50 MCG PO TABS: 50.0000 ug | ORAL_TABLET | Freq: Every day | ORAL | Status: DC

## 2015-01-22 NOTE — Progress Notes (Addendum)
   Subjective:    Patient ID: Cynthia Wiley, female    DOB: Oct 19, 1966, 49 y.o.   MRN: [id number] CC: diabetes with hyperglycemia  HPI 49 yo Hispanic female NP:  Spanish interpreter present   1. CHRONIC DIABETES dx in 2009   Disease Monitoring  Blood Sugar Ranges: 145-200  Polyuria: no   Visual problems: no   Medication Compliance: yes, metformin 1000 mg BID  Medication Side Effects  Hypoglycemia: no   Preventitive Health Care  Eye Exam: due   Foot Exam: done today    2. Hypothyroidism: dx 2010. Taking synthroid 50 mcg daily. Ran out yesterday. Admits to intermittent palpitations. No weight changes.   Soc Hx: chronic non smoker  Med Hx: DM2 dx in 2009 Fam Hx: DM2 in father and sister  Review of Systems As per HPI  Some palpitations  GAD-7: score of 0     Objective:   Physical Exam BP 127/77 mmHg  Pulse 77  Temp(Src) 97.8 F (36.6 C)  Resp 16  Ht 5' (1.524 m)  Wt 160 lb (72.576 kg)  BMI 31.25 kg/m2  SpO2 99%  LMP 01/19/2013 General appearance: alert, cooperative and no distress Eyes: conjunctivae/corneas clear. PERRL, EOM's intact. Throat: lips, mucosa, and tongue normal; teeth and gums normal Neck: no adenopathy, supple, symmetrical, trachea midline and thyroid not enlarged, symmetric, no tenderness/mass/nodules Lungs: clear to auscultation bilaterally Heart: regular rate and rhythm, S1, S2 normal, no murmur, click, rub or gallop Extremities: extremities normal, atraumatic, no cyanosis or edema  CBG 219 Lab Results  Component Value Date   HGBA1C 11.10 01/22/2015      Assessment & Plan:

## 2015-01-22 NOTE — Patient Instructions (Addendum)
Mrs. Cynthia Wiley,  Thank you for coming in today:  1. Diabetes: Goal A1c is < 7 Diabetes  Check blood sugar day fasting (3 times per day) and before meals  Goal fasting 100  Goal after eating < 160 Beware of hypoglycemia (low blood sugar) which is blood sugar < 70 with or without symptoms  To get to goal Continue metformin 1000 mg twice daily Add januvia 100 mg once daily  Low carb diet.    2. Hypothyroidism:  Refilled synthroid 50 mg daily  Checking TSH  You will be called with lab results   F/u in 3 weeks with RN for blood sugar review See me in 3 months for diabetes  Dr. Adrian Blackwater   Dr. Adrian Blackwater

## 2015-01-22 NOTE — Assessment & Plan Note (Signed)
Check vit D level. 

## 2015-01-22 NOTE — Assessment & Plan Note (Signed)
2. Hypothyroidism: appears euthyroid  Refilled synthroid 50 mg daily  Checking TSH

## 2015-01-22 NOTE — Progress Notes (Signed)
Patient here to establish care Patient has been DM2 since 2009 and is complaining that her sugars havae been slowly rising Patient checks her sugar 1 x per day but does not keep a record Patient had been prescribed lovastatin and glimiperide but does not take them because she says they make her feel poorly and her MD told her to stop the lovaststin because he did not need anymore Patient needs refills on metformin and levothyroxine Had flu shot Patient is fasting

## 2015-01-22 NOTE — Assessment & Plan Note (Signed)
1. Diabetes: Goal A1c is < 7 Diabetes  Check blood sugar day fasting (3 times per day) and before meals  Goal fasting 100  Goal after eating < 160 Beware of hypoglycemia (low blood sugar) which is blood sugar < 70 with or without symptoms  To get to goal Continue metformin 1000 mg twice daily Add januvia 100 mg once daily  Low carb diet.

## 2015-01-23 LAB — MICROALBUMIN / CREATININE URINE RATIO
Creatinine, Urine: 26.6 mg/dL
MICROALB UR: 0.2 mg/dL (ref <2.0)
MICROALB/CREAT RATIO: 7.5 mg/g (ref 0.0–30.0)

## 2015-01-23 LAB — VITAMIN D 25 HYDROXY (VIT D DEFICIENCY, FRACTURES): Vit D, 25-Hydroxy: 14 ng/mL — ABNORMAL LOW (ref 30–100)

## 2015-01-25 DIAGNOSIS — E559 Vitamin D deficiency, unspecified: Secondary | ICD-10-CM | POA: Insufficient documentation

## 2015-01-25 MED ORDER — VITAMIN D (ERGOCALCIFEROL) 1.25 MG (50000 UNIT) PO CAPS: 50000.0000 [IU] | ORAL_CAPSULE | ORAL | Status: DC

## 2015-01-25 MED ORDER — ATORVASTATIN CALCIUM 40 MG PO TABS: 40.0000 mg | ORAL_TABLET | Freq: Every day | ORAL | Status: DC

## 2015-01-25 NOTE — Addendum Note (Signed)
Addended by: Boykin Nearing on: 01/25/2015 10:49 AM   Modules accepted: Orders

## 2015-01-25 NOTE — Assessment & Plan Note (Signed)
A: vit D def P: replete

## 2015-01-29 ENCOUNTER — Telehealth: Payer: Self-pay | Admitting: *Deleted

## 2015-01-29 NOTE — Telephone Encounter (Signed)
Pt aware of lab results and Rx (inforamtion given in Spanish)

## 2015-01-29 NOTE — Telephone Encounter (Signed)
-----   Message from Minerva Ends, MD sent at 01/25/2015 10:47 AM EST ----- Normal TSH, CBC, urine microalbuminuria  Vit D deficiency with replete Elevated cholesterol will prescribe statin

## 2015-02-09 ENCOUNTER — Ambulatory Visit: Payer: Self-pay | Attending: Family Medicine | Admitting: Internal Medicine

## 2015-02-09 VITALS — BP 111/72 | HR 81 | Temp 97.6°F | Ht 60.0 in | Wt 156.2 lb

## 2015-02-09 DIAGNOSIS — E118 Type 2 diabetes mellitus with unspecified complications: Secondary | ICD-10-CM

## 2015-02-09 MED ORDER — INSULIN SYRINGES (DISPOSABLE) U-100 0.5 ML MISC: Status: DC

## 2015-02-09 MED ORDER — INSULIN ASPART PROT & ASPART (70-30 MIX) 100 UNIT/ML ~~LOC~~ SUSP: 10.0000 [IU] | Freq: Two times a day (BID) | SUBCUTANEOUS | Status: DC

## 2015-02-09 NOTE — Progress Notes (Signed)
Pt here for nurse visit to review CBG log.  Goal per last OV note is to have fasting glucose at 100, and after meals at <160.  Pt states she is taking her medications daily.  She is fasting today and blood sugar is 117 this morning. Blood sugars are not at goal.  Reviewed log with Dr. Adrian Blackwater and she recommends adding Insulin 70/30 10 units twice a day with meals.   Instruction provided to the patient on how to administer insulin. Interpreter present [phone].  Rx sent to pharmacy.  Reading information also provided to the patient on diabetes management.  Referral placed for nutrition appointment.

## 2015-02-09 NOTE — Patient Instructions (Addendum)
Estamos aadiendo la insulina 70/30 10 [employer] al da.  Es necesario tomar esto adems de todas sus otras medicinas.  Es necesario hacer una cita en 2 semanas con la enfermera.  Por favor traiga su libro de registro de la sangre de Location manager.  Continuar para comprobar su nivel de azcar en la sangre tres veces al da. Cmo y dnde aplicar inyecciones de insulina por va subcutnea en adultos (How and Where to Give Subcutaneous Insulin Injections, Adult) Las personas con diabetes de tipo 1 deben administrarse insulina debido a que sus cuerpos no la producen. Las personas con diabetes tipo 2 pueden requerir insulina. Existen muchos tipos diferentes de [place], as como tambin otros medicamentos inyectables para la diabetes, que se deben Investment banker, corporate la capa de tejido graso que se encuentra debajo de la piel. El tipo de insulina o el medicamento inyectable para la diabetes que tome puede determinar cuntas inyecciones deber administrarse y cundo deben aplicarse.  ELEGIR UNA ZONA PARA LA INYECCIN: La absorcin de insulina vara de un sitio a otro. Al igual que con cualquier medicamento inyectable, se recomienda inyectar la insulina dentro de la misma regin del cuerpo. Sin embargo, la insulina no debe inyectarse en la misma zona cada vez que se administre. Rotar las zonas para las Chartered certified accountant la inflamacin o la degradacin de los tejidos. Hay cuatro regiones principales que pueden utilizarse para las inyecciones. Las regiones incluyen:  Abdomen (regin de preferencia, especialmente para medicamentos inyectables para la diabetes diferentes de la insulina).  La parte anterior y superior externa de los muslos.  La parte posterior superior del brazo  Los glteos UTILIZACIN DE LA [place] AMPOLLA Administrar insulina: nica dosis  Lave sus manos con agua y [place].  Haga rodar Auto[employer]Owners [employer] frasco de insulina (ampolla) entre sus manos para [place]. No agite la  ampolla.  Limpie el tapn de goma de la ampolla con un hisopo con alcohol. Asegrese de eliminar la parte plstica superior de las ampollas nuevas.  Retire la cubierta plstica de la aguja que est en la [place]. No deje que la aguja toque nada.  Tire del mbolo para extraer el aire de la [place]. Debe tener la misma cantidad de aire que de dosis de [place].  Inserte la aguja a travs de la tapa de goma de Scientist, research (medical). No d vuelta la ampolla.  Empuje todo el mbolo para pasar el aire a la ampolla.  Deje la aguja en la ampolla. Luego, d vuelta la ampolla y la [place].  Tire lentamente del mbolo, para que ingrese la cantidad de [place] que necesita a Environmental manager.  Observe si quedaron burbujas de Target Corporation. Podr necesitar empujar el mbolo hacia arriba y [place] a la [place] abajo 2 o 3 veces para eliminar las burbujas de aire de la [place].  Tire nuevamente del mbolo para conseguir la dosis correcta.  Quite la aguja de la ampolla.  Utilice una toallita impregnada en alcohol para limpiar la zona en la que se inyectar.  Inyctela, aproximadamente, 1 pulgada (2,5 cm) por debajo de la superficie de la piel, y [place].  Coloque la aguja derecha en la piel (en un ngulo de 90 grados). Inserte la aguja en toda su extensin (hasta la unin con la [place]). En adultos de talla pequea y que tienen poca grasa, deber inyectarla en un ngulo de 45 grados.  Cuando la aguja est insertada, puede soltar la piel.  Empuje el mbolo hasta el final para Nurse, children's.  Para extraer, tire la aguja en forma recta.  Presione la [place] en alcohol sobre el sitio en que aplic la inyeccin. Sostngalo all por algunos segundos. No frote la zona.  No vuelva a colocar la cubierta plstica en la aguja. Administrar insulina: mezcla de 2 tipos de insulina  Lave sus manos con agua y [place].  Haga rodar [employer] frasco (ampolla) de la insulina "turbia" entre sus manos o grelo  hacia abajo para [place].  Limpie la parte superior de la ampolla con un hisopo con alcohol. Asegrese de eliminar la tapa plstica superior de las ampollas nuevas.  Asegrese de que entre a la jeringa la misma cantidad de aire como de [place] "turbia" que necesita.  Coloque la aguja de la jeringa en el envase de insulina "turbia" e inyecte aire. Asegrese de que la ampolla est [place].  Quite la aguja de la ampolla de [place] "turbia".  Tire de la jeringa para que entre tanto aire como la cantidad de insulina "cristalina" que necesita.  Coloque la aguja de la jeringa en el envase de insulina "cristalina" e inyecte aire.  Deje la aguja en la ampolla de insulina "cristalina" y luego pngala boca abajo.  Tire lentamente del mbolo para que la cantidad de insulina "cristalina" deseada ingrese a la [place].  Observe si quedaron burbujas de Target Corporation. Podr necesitar empujar el mbolo hacia arriba y [place] abajo 2 o 3 veces para eliminar las burbujas de aire de la [place].  Quite la aguja de la ampolla de insulina "cristalina".  Coloque la aguja en la ampolla de insulina "turbia". No inyecte la insulina "cristalina" en el frasco de la "turbia".  Coloque la ampolla de la insulina "turbia" hacia abajo y tire del mbolo hasta que quede la misma cantidad total de insulina "cristalina" e insulina "turbia".  Quite la aguja de la ampolla de [place] "turbia".  Utilice una toallita impregnada en alcohol para limpiar la zona en la que se inyectar.  Coloque la aguja derecha en la piel (en un ngulo de 90 grados). Inserte la aguja en toda su extensin (hasta la unin con la [place]). En adultos de talla pequea y que tienen poca grasa, deber inyectarla en un ngulo de 45 grados.  Cuando la aguja est insertada, puede soltar la piel.  Empuje el mbolo hasta el final para Nurse, children's.  Para extraer, tire la aguja en forma recta.  Presione la [place] en  alcohol sobre el sitio en que aplic la inyeccin. Sostngalo all por algunos segundos. No frote la zona.  No vuelva a colocar la cubierta plstica en la aguja. [place] con agua y [place].  Si est utilizando insulina "turbia" haga rodar la lapicera entre sus manos varias veces o grela de arriba [place].  Retire la tapa de la lapicera de [place].  Limpie el tapn de goma del cartucho con una toallita impregnada en alcohol.  Quite la etiqueta protectora de la aguja desechable.  Enrosque la Surveyor, quantity.  Retire la cubierta plstica externa protectora de la aguja.  Retire la cubierta plstica interna protectora de la aguja.  Prepare la lapicera de insulina colocando el botn (dial) a 2 unidades. Sostenga la lapicera apuntando hacia arriba y empuje el dial hasta que aparezca una gota de insulina en la punta de la aguja. Si esto no ocurre, Pharmacist, community.  Coloque en el dial el nmero de unidades de insulina que [place].  Utilice una toallita impregnada en alcohol para limpiar la zona en la que se inyectar.  Inyctela, aproximadamente, 1 pulgada (2,5 cm) por debajo de la superficie de la piel, y [place].  Coloque la aguja derecha en la piel (en un ngulo de 90 grados).  Empuje el botn (dial) hacia abajo para que la insulina ingrese en el tejido [place].  Cuente hasta 10 lentamente. Luego quite la [place] del tejido Geneticist, molecular.  Coloque la cubierta plstica externa en la aguja y desenrsquela. COMO DESECHAR LOS ELEMENTOS  Deseche las jeringas usadas en un recipiente especial para objetos cortopunzantes. Siga las indicaciones de las normas de la zona en que usted vive.  Las [employer] Corporation y las lapiceras desechables pueden arrojarse en un cesto de basura comn. Document Released: 11/21/2005 Document Revised: 11/26/2013 [facility] Patient Information 2015 [place]. This information is not intended to replace advice given to  you by your health care provider. Make sure you discuss any questions you have with your health care provider.  Recuento bsico de carbohidratos para la diabetes mellitus (Basic Carbohydrate Counting for Diabetes Mellitus) El recuento de carbohidratos es un mtodo destinado a calcular la cantidad de carbohidratos en la dieta. El consumo de carbohidratos aumenta naturalmente el nivel de azcar (glucosa) en la sangre, por lo que es importante que sepa la cantidad que debe incluir en cada comida. El recuento de carbohidratos ayuda a Advertising account executive de glucosa en la sangre dentro de los lmites normales. La cantidad permitida de carbohidratos es diferente para cada persona. Un nutricionista puede ayudarlo a calcular la cantidad adecuada para usted. Una vez que sepa la cantidad de carbohidratos que puede consumir, podr calcular los carbohidratos de los alimentos que desea comer. Los siguientes alimentos incluyen carbohidratos:  Granos, como panes y cereales.  Frijoles secos y productos con soja.  Vegetales almidonados, como papas, guisantes y maz.  Lambert Mody y jugos de frutas.  Leche y Estate agent.  Dulces y bocadillos, como pastel, galletas, caramelos, papas fritas de bolsa, refrescos y bebidas frutales con azcar. RECUENTO DE CARBOHIDRATOS [employer] de calcular los carbohidratos de los alimentos. Puede usar cualquiera de los dos mtodos o [place] combinacin de [place]. Leer la etiqueta de informacin nutricional de los alimentos envasados La informacin nutricional es una etiqueta incluida en casi todas las bebidas y los alimentos envasados de los [place]. Indica el tamao de la porcin de ese alimento o bebida e informacin sobre los nutrientes de cada porcin, incluso los gramos (g) de carbohidratos por porcin.  Decida la cantidad de porciones que comer o tomar de este alimento o bebida. Multiplique la cantidad de porciones por el nmero de gramos de carbohidratos indicados en la etiqueta  para esa porcin. El total ser la cantidad de carbohidratos que consumir al comer ese alimento o tomar esa bebida. Conocer las porciones estndar de los alimentos Cuando coma alimentos no envasados o que no incluyan la informacin nutricional en la etiqueta, deber medir las porciones para poder calcular la cantidad de carbohidratos. Una porcin de la mayora de los alimentos ricos en carbohidratos contiene alrededor de 15g de carbohidratos. La siguiente [employer] tamaos de porcin de los alimentos ricos en carbohidratos que contienen alrededor de 15g de carbohidratos por porcin:   1rebanada de pan (1oz) o 1tortilla de seis pulgadas.  panecillo de hamburguesa o bollito tipo ingls.  4a 6galletas.   de taza de cereal sin azcar y seco.   taza de cereal caliente.   de taza de arroz o pastas.  taza de pur de papas o de una papa grande al horno.  1taza de frutas frescas o una fruta pequea.  taza de frutas o jugo de frutas enlatados o congelados.  [place].   de taza de yogur descremado sin ningn agregado o de yogur endulzado con edulcorante artificial.  taza de vegetales almidonados, como guisantes, maz o papas, o de frijoles secos cocidos. Decida la cantidad de porciones Gaffer. Multiplique la cantidad de porciones por 15 (los gramos de carbohidratos en esa porcin). Por ejemplo, si come 2tazas de fresas, habr comido 2porciones y 30g de carbohidratos (2porciones x 15g = 30g). [place] y guisos, en las que se mezcla ms de un alimento, deber [employer] carbohidratos de cada alimento incluido. EJEMPLO DE RECUENTO DE CARBOHIDRATOS Ejemplo de cena  3 onzas de pechugas de pollo.   de taza de arroz integral.   taza de [place].  1 taza de fresas con crema batida sin azcar. Clculo de [place] 1: Identifique los alimentos que contienen carbohidratos:    Arroz.  Maz.  Leche.  Hughie Closs. [place] 2: Calcule el nmero de porciones que consumir de cada uno:   2 porciones de Occupational psychologist.  1 porcin de maz.  [place].  1 porcin de fresas. [place] 3: Multiplique cada una de esas porciones por 15g:   2 porciones de arroz x 15 g = 30 g.  1 porcin de maz x 15 g = 15 g.  1 porcin de leche x 15 g = 15 g.  1 porcin de fresas x 15 g = 15 g. [place] 4: Sume todas las cantidades para Armed forces logistics/support/administrative officer total de gramos de carbohidratos consumidos: 30 g + 15 g + 15 g + 15 g = 75 g. Document Released: 02/13/2012 Document Revised: 04/07/2014 [facility] Patient Information 2015 [place]. This information is not intended to replace advice given to you by your health care provider. Make sure you discuss any questions you have with your health care provider.

## 2015-02-19 ENCOUNTER — Telehealth: Payer: Self-pay | Admitting: Family Medicine

## 2015-02-19 DIAGNOSIS — E118 Type 2 diabetes mellitus with unspecified complications: Secondary | ICD-10-CM

## 2015-02-19 NOTE — Telephone Encounter (Signed)
Please call patient  We cannot continue januvia due to access. Plan: Metformin 1000 mg BID  novloin 70/30 10 U BID with plan to increase dose as needed to get CBGs to goal

## 2015-02-19 NOTE — Assessment & Plan Note (Signed)
Patient undocumented and cannot continue [place] due to access. Plan: Metformin 1000 mg BID  novloin 70/30 10 U BID with plan to increase dose as needed to get CBGs to goal

## 2015-02-26 ENCOUNTER — Ambulatory Visit: Payer: Self-pay | Attending: Family Medicine | Admitting: *Deleted

## 2015-02-26 VITALS — BP 123/76 | HR 80 | Temp 98.1°F | Resp 18 | Wt 151.4 lb

## 2015-02-26 DIAGNOSIS — E1165 Type 2 diabetes mellitus with hyperglycemia: Secondary | ICD-10-CM | POA: Insufficient documentation

## 2015-02-26 NOTE — Patient Instructions (Signed)
Plan de alimentacin DASH (DASH Eating Plan) DASH es la sigla en ingls de "Enfoques Alimentarios para Detener la Hipertensin". El plan de alimentacin DASH ha demostrado bajar la presin arterial elevada (hipertensin). Los beneficios adicionales para la salud pueden incluir la disminucin del riesgo de diabetes mellitus tipo2, enfermedades cardacas e ictus. Este plan tambin puede ayudar a Horticulturist, commercial. QU DEBO SABER ACERCA DEL PLAN DE ALIMENTACIN DASH? Para el plan de alimentacin DASH, seguir las siguientes pautas generales:  Elija los alimentos con un valor porcentual diario de sodio de menos del 5% (segn figura en la etiqueta del alimento).  Use hierbas o aderezos sin sal, en lugar de sal de mesa o sal marina.  Consulte al mdico o farmacutico antes de usar sustitutos de la sal.  Coma productos con bajo contenido de sodio, cuya etiqueta suele decir "bajo contenido de sodio" o "sin agregado de sal".  Coma alimentos frescos.  Coma ms verduras, frutas y productos lcteos con bajo contenido de [place].  Elija los cereales integrales. Busque la palabra "integral" en Equities trader de la lista de ingredientes.  Elija el pescado y el pollo o el pavo sin piel ms a menudo que las carnes rojas. Limite el consumo de pescado, carne de ave y carne a 6onzas (170g) por Training and development officer.  Limite el consumo de dulces, postres, azcares y bebidas azucaradas.  Elija las grasas saludables para el corazn.  Limite el consumo de queso a 1onza (28g) por Training and development officer.  Consuma ms comida casera y menos de restaurante, de buf y comida rpida.  Limite el consumo de alimentos fritos.  Cocine los alimentos utilizando mtodos que no sean la fritura.  Limite las verduras enlatadas. Si las consume, enjuguelas bien para disminuir el sodio.  Cuando coma en un restaurante, pida que preparen su comida con menos sal o, en lo posible, sin nada de sal. QU ALIMENTOS PUEDO COMER? Pida ayuda a un nutricionista para  conocer las necesidades calricas individuales. Cereales Pan de salvado o integral. Arroz integral. Pastas de salvado o integrales. Quinua, trigo burgol y cereales integrales. Cereales con bajo contenido de sodio. Tortillas de harina de maz o de salvado. Pan de maz integral. Galletas saladas integrales. Galletas con bajo contenido de [place]. Vegetales Verduras frescas o congeladas (crudas, al vapor, asadas o grilladas). Jugos de tomate y verduras con contenido bajo o reducido de sodio. Pasta y salsa de tomate con contenido bajo o [place]. Verduras enlatadas con bajo contenido de sodio o reducido de sodio.  Lambert Mody Lambert Mody frescas, en conserva (en su jugo natural) o frutas congeladas. Carnes y otros productos con protenas Carne de res molida (al 85% o ms [place]), carne de res de animales alimentados con pastos o carne de res sin la grasa. Pollo o pavo sin piel. Carne de pollo o de [place]. Cerdo sin la grasa. Todos los pescados y frutos de mar. Huevos. Porotos, guisantes o lentejas secos. Frutos secos y semillas sin sal. Frijoles enlatados sin sal. Lcteos Productos lcteos con bajo contenido de grasas, como [place] o al 1%, quesos reducidos en grasas o al 2%, ricota con bajo contenido de grasas o [employer], o yogur natural con bajo contenido de [place]. Quesos con contenido bajo o reducido de sodio. Grasas y Naval architect en barra que no contengan grasas trans. Mayonesa y alios para ensaladas livianos o reducidos en grasas (reducidos en sodio). [place]. Aceites de crtamo, oliva o canola. Mantequilla natural de man o almendra. Otros Palomitas de maz y pretzels sin sal.  Los artculos mencionados arriba pueden no ser Dean Foods Company de las bebidas o los alimentos recomendados. Comunquese con el nutricionista para conocer ms opciones. QU ALIMENTOS NO SE RECOMIENDAN? Cereales Pan blanco. Pastas blancas. Arroz blanco. Pan de maz refinado. Bagels y  croissants. Galletas saladas que contengan grasas trans. Vegetales Vegetales con crema o fritos. Verduras en [place]. Verduras enlatadas comunes. Pasta y salsa de tomate en lata comunes. Jugos comunes de tomate y de verduras. Lambert Mody Frutas secas. Fruta enlatada en almbar liviano o espeso. Jugo de frutas. Carnes y otros productos con protenas [place] de carne con Lobbyist. Costillas, alas de pollo, tocineta, salchicha, mortadela, salame, chinchulines, tocino, perros calientes, salchichas alemanas y embutidos envasados. Frutos secos y semillas con sal. Frijoles con sal en lata. Lcteos Leche entera o al 2%, crema, mezcla de [place] y crema, y queso crema. Yogur entero o endulzado. Quesos o queso azul con alto contenido de Physicist, medical. [place]remas no lcteas y coberturas batidas. Quesos procesados, quesos para untar o cuajadas. Condimentos Sal de cebolla y ajo, sal condimentada, sal de mesa y sal marina. Salsas en lata y envasadas. Salsa [place]. Salsa trtara. Salsa barbacoa. Salsa teriyaki. Salsa de soja, incluso la que tiene contenido reducido de [place]. Salsa de carne. Salsa de pescado. Salsa de [place]. Salsa rosada. Rbano picante. Ketchup y mostaza. Saborizantes y tiernizantes para carne. Caldo en cubitos. Salsa picante. Salsa tabasco. Adobos. Aderezos para tacos. Salsas. Grasas y aceites [place], [place] en barra, [place] de [place], [place], [place] clarificada y Wendee Copp de tocino. Aceites de coco, de palmiste o de palma. Aderezos comunes para ensalada. Otros Pickles y [place]. Palomitas de maz y pretzels con sal. Los artculos mencionados arriba pueden no ser Dean Foods Company de las bebidas y los alimentos que se Higher education careers adviser. Comunquese con el nutricionista para obtener ms informacin. DNDE Dolan Amen MS INFORMACIN? [place], del Pulmn y de la Sangre (National Heart, Lung, and [place]):  [email or web address] Document Released: 11/10/2011 Document Revised: 04/07/2014 [facility] Patient Information 2015 [place], [place]. This information is not intended to replace advice given to you by your health care provider. Make sure you discuss any questions you have with your health care provider. Dieta para el control del colesterol y las grasas  (Fat and Cholesterol Control Diet) Su dieta tiene un Starbucks Corporation [place] de grasa y colesterol en su sangre y rganos. El exceso de [place] y colesterol en la sangre puede afectar su:   Corazn.  Vasos sanguneos (arterias, venas).  Vescula biliar.  Hgado.  Pncreas. CONTROL DE LA GRASA Y EL COLESTEROL CON LA DIETA  Ciertos alimentos pueden subir el colesterol y 48 pueden [place]. Es importante que reemplace las grasas malas por otros tipos de [place].  No consuma:   Carnes grasas, como las salchichas y [place].  Margarina en barra y Building services engineer en tubo que tienen "aceites parcialmente hidrogenados" en ellos.  Productos horneados, como galletas dulces y saladas que tienen "aceites parcialmente hidrogenados" en ellos.  Los Walt Disney, como los aceites de coco y de [place].  Consuma los siguientes alimentos:  Cortes de peceto o lomo de carne roja.  Pollo (sin piel).  Pescado.  Ternera.  Carne de Dana Corporation.  Mariscos.  Frutas, como manzanas.  Verduras, como el brcoli, papas y zanahorias.  Frijoles, arvejas y lentejas (legumbres).  Cereales, como [place], arroz, cuscs y trigo bulgur.  Pastas (sin salsas de crema). Busque alimentos sin grasas, descremados y bajos en colesterol. Encontrar alimentos con fibra soluble  y esteroles vegetales (fitosteroles). Usted debe comer 2 gramos al da de estos alimentos.  IDENTIFIQUE LOS ALIMENTOS BAJOS EN GRASAS Y COLESTEROL   Encuentre los alimentos con fibra soluble y esteroles vegetales (fitosteroles). Debe consumir 2 gramos al  da de estos alimentos. Ellos son:  Lambert Mody.  Vegetales.  Granos enteros.  Frijoles y [employer].  Frutos secos y semillas.  Gann etiquetas del paquete. Busque los alimentos bajos en grasas saturadas, libres en grasas trans, y de bajo [place].  Elija quesos que contengan slo 2 a 3 gramos de grasa saturada por onza.  Use margarina que sea saludable para el corazn que sea [place] de grasas trans o aceites parcialmente hidrogenados.  Evite comprar productos horneados que contengan aceites parcialmente hidrogenados. En su lugar, compre productos horneados elaborados con cereales integrales (trigo integral o harina de avena integral). Evite los productos horneados en cuya etiqueta diga con "harina" o "harina enriquecida".  Compre sopas en lata que no sean cremosas, con bajo contenido de sal y sin grasas adicionadas. PREPARE SUS ALIMENTOS USTED MISMO   Cocine los alimentos hervidos, horneados, al vapor o asados. No fra los alimentos.  Utilice un spray antiadherente para cocinar.  Use limn o hierbas para condimentar comidas en lugar de usar mantequilla o margarina.  Use yogur descremados, salsas o aderezos para ensaladas con bajo contenido de [place]. BAJO EN GRASAS SATURADAS / SUSTITUTOS BAJOS EN GRASA  Carnes / grasas saturadas (g)  Evite: Bife, veteado (3 oz/85 g) / 11 g  Elija: Bife, magro(3 oz/85 g) / 4 g  Evite: Hamburguesa (3 oz/85 g) / 7 g  Elija: Hamburguesa, magra (3 oz/85 g) / 5 g  Evite: Jamn (3 oz/85 g) / 6 g  Elija: Jamn, corte magro (3 oz/85 g) / 2,4 g  Evite: Pollo con piel, carne oscura (3 oz/85 g) / 4 g  Elija: Pollo, sin piel, carne oscura (3 oz/85 g) / 2 g  Evite: Pollo con piel, carne blanca (3 oz/85 g) / 2,5 g  Elija: Pollo, sin piel, carne blanca (3 oz/85 g) / 1 g Lcteos / Grasa saturada (g)  Evite: Leche entera (1 taza) / 5 g  Elija: Leche descremada, 2% (1 taza) / 3 g  Elija: Leche descremada, 1% (1 taza) / 1,5 g  Elija:  Leche descremada, 1 taza / 0,3 g  Evite: Queso duro (1 oz/28 g) / 6 g  Elija: Queso de USG Corporation (1 oz/28 g) / 2 a 3 g  Evite: Queso cottage, 4% de grasa (1 taza) / 6,5 g  Elija: Queso cottage bajo en grasa, 1% de grasa (1 taza) / 1,5 g  Evite: [place]ado (1 taza) / 9 g  Elija: Sorbete (1 taza) / 2,5 g  Elija: Yogur congelado descremado (1 taza) / 0,3 g  Elija: Barra de frutas congeladas / trace  Evite: Crema batida (1 cucharada) / 3,5 g  Elija: Crema batida no lctea (1 cucharada) / 1 g Condimentos / Grasas Saturadas (g)  Evite: Mayonesa (1 cucharada) / 2 g  Elija: Mayonesa baja en grasa (1 cucharada) / 1 g  Evite: Mantequilla (1 cucharada) / 7 g  Elija: Margarina light extra (1 cucharada) / 1 g  Evite: Aceite de coco (1 cucharada) / 11,8 g  Elija: Aceite de oliva (1 cucharada) / 1,8 g  Elija: Aceite de maz (1 cucharada) / 1,7 g  Elija: Aceite de crtamo (1 cucharada) / 1,2 g  Elija: Aceite de girasol (1 cucharada) / 1,4 g  Elija: Aceite de soja (1 cucharada) / 2,4 g  Elija: Aceite de canola (1 cucharada) / 1 g Document Released: 05/22/2012 Document Revised: 03/18/2013 [facility] Patient Information 2015 [place], [place]. This information is not intended to replace advice given to you by your health care provider. Make sure you discuss any questions you have with your health care provider.

## 2015-02-26 NOTE — Progress Notes (Signed)
Spoke with patient via [employer], [employer], [place], and [place], [place], and in-house Interpreter, [place] Patient presents for [facility] and record review after starting 70/30 insulin, however, patient states she took only one injection of 70/30 then stopped because blood sugars lowered Med list reviewed; patient reports taking all meds as directed, except 70/30 insulin Patient's AM fasting blood sugars ranging 83-125 Patient's before lunch blood sugars ranging 88-118  Patient's before dinner blood sugars ranging 81-138 Working out 3 hours daily (2 hours in AM and 1 hour in afternoon) Following low carbohydrate, low fat and low sodium diet  BP 126/73 P 80 R 18 T  98.2oral SpO2 98% Wt 151.4 lb  Per PCP: Discontinue 70/30 insulin  Patient aware that she is to f/u with PCP 3 months from last visit. Due 04/22/2015  Patient given literature on DASH Eating Plan and  Fat and Cholesterol Control Diet

## 2015-03-04 ENCOUNTER — Ambulatory Visit: Payer: Self-pay | Attending: Family Medicine

## 2015-03-09 ENCOUNTER — Encounter: Payer: Self-pay | Admitting: Dietician

## 2015-03-09 ENCOUNTER — Encounter: Payer: Self-pay | Attending: Family Medicine | Admitting: Dietician

## 2015-03-09 VITALS — Ht 60.63 in | Wt 151.0 lb

## 2015-03-09 DIAGNOSIS — Z713 Dietary counseling and surveillance: Secondary | ICD-10-CM | POA: Insufficient documentation

## 2015-03-09 DIAGNOSIS — E119 Type 2 diabetes mellitus without complications: Secondary | ICD-10-CM | POA: Insufficient documentation

## 2015-03-09 NOTE — Patient Instructions (Signed)
Plan:  Aim for 2-3 Carb Choices per meal (30-45 grams) +/- 1 either way  Aim for 0-2 Carbs per snack if hungry  Include protein in moderation with your meals and snacks Consider reading food labels for Total Carbohydrate and Fat Grams of foods Continue to walk daily.   Consider checking BG at alternate times per day as directed by MD  Consider taking medication as directed by MD

## 2015-03-09 NOTE — Progress Notes (Signed)
  Medical Nutrition Therapy:  Appt start time: 4696 end time:  2952.   Assessment:  Primary concerns today: Patient is here today to at the request of her primary physician.  She is accompanied by a Spanish interpretor, Marta Col a [place] interpretor.  Patient has had Type 2 diabetes since 2009.  She states that she received education at that time.  Patient checks her  blood sugars tid.  CBG:  99-125 before breakfast if exercising or 130-150 if not exercising, 115-130 before lunch and dinner,  158 after meals when checked on occasion.  Patient states that she was not eating as well prior to the last HgbA1C but has since made many diet changes.  Patient lives alone.  She does her own shopping and cooking.  She is currently unemployed.   Results for FREDERICK, KLINGER (MRN [id number]) as of 03/09/2015 10:27  Ref. Range 12/03/2012 14:30 01/22/2015 09:59  Hemoglobin A1C Latest Range: <5.7 % 7.1 (H) 11.10   Preferred Learning Style:   No preference indicated   Learning Readiness:   Ready  Change in progress   MEDICATIONS: glucophage, see list   DIETARY INTAKE:  24-hr recall:  B ( AM):   [employer] bread, 2 slices [place], 2% cheddar cheese slice, coffee with 0-2% milk  Snk ( AM): sometimes pretzels  L ( PM): Salad with [place] and cheddar cheese, lemon for dressing,  Snk ( PM): sometimes pretzels or fruit D ( PM): zucchini, broccoli, and cauliflower, chicken or fish and cheese, [employer] bread or [place] sandwich, sometimes fruit Snk ( PM):  Unsweetened cheerios and milk Beverages: coffee with milk, diet soda, water, occasional milk  Usual physical activity: has not been exercising due to the cold.  When she exercises she walks 2 hours in the morning and 1 hour in the afternoon.  Estimated energy needs: 1500 calories 170 g carbohydrates 112 g protein 42 g fat  Progress Towards Goal(s):  In progress.   Nutritional Diagnosis:  NB-1.1 Food and nutrition-related knowledge deficit As related  to balance of carbohydrate, protein, and fat.  As evidenced by uncontrolled DM.    Intervention:  Nutrition counseling and diabetes education initiated. Discussed Carb Counting by food group as method of portion control, reading food labels, and benefits of increased activity. Also discussed basic physiology of Diabetes, target BG ranges pre and post meals, and A1c.   Plan:  Aim for 2-3 Carb Choices per meal (30-45 grams) +/- 1 either way  Aim for 0-2 Carbs per snack if hungry  Include protein in moderation with your meals and snacks Consider reading food labels for Total Carbohydrate and Fat Grams of foods Continue to walk daily.   Consider checking BG at alternate times per day as directed by MD  Consider taking medication as directed by MD  Teaching Method Utilized:  Visual Auditory Hands on  Handouts given during visit include: in spanish  Meal plan card  My plate  Living with diabetes book  Barriers to learning/adherence to lifestyle change: none  Demonstrated degree of understanding via:  Teach Back   Monitoring/Evaluation:  Dietary intake, exercise, label reading, and body weight prn.

## 2015-04-27 ENCOUNTER — Encounter: Payer: Self-pay | Admitting: Family Medicine

## 2015-04-27 ENCOUNTER — Ambulatory Visit: Payer: MEDICAID | Attending: Family Medicine | Admitting: Family Medicine

## 2015-04-27 VITALS — BP 112/73 | HR 75 | Temp 98.9°F | Resp 16 | Ht 61.0 in | Wt 142.0 lb

## 2015-04-27 DIAGNOSIS — Z0000 Encounter for general adult medical examination without abnormal findings: Secondary | ICD-10-CM

## 2015-04-27 DIAGNOSIS — Z114 Encounter for screening for human immunodeficiency virus [HIV]: Secondary | ICD-10-CM | POA: Insufficient documentation

## 2015-04-27 DIAGNOSIS — Z23 Encounter for immunization: Secondary | ICD-10-CM

## 2015-04-27 DIAGNOSIS — E119 Type 2 diabetes mellitus without complications: Secondary | ICD-10-CM | POA: Insufficient documentation

## 2015-04-27 LAB — POCT GLYCOSYLATED HEMOGLOBIN (HGB A1C): HEMOGLOBIN A1C: 7.8

## 2015-04-27 LAB — GLUCOSE, POCT (MANUAL RESULT ENTRY): POC Glucose: 138 mg/dl — AB (ref 70–99)

## 2015-04-27 MED ORDER — GLUCOSE BLOOD VI STRP: 1.0000 | ORAL_STRIP | Freq: Two times a day (BID) | Status: DC

## 2015-04-27 MED ORDER — TRUEPLUS LANCETS 28G MISC: 1.0000 | Freq: Two times a day (BID) | Status: DC

## 2015-04-27 MED ORDER — TRUE METRIX METER W/DEVICE KIT: 1.0000 | PACK | Status: DC | PRN

## 2015-04-27 NOTE — Patient Instructions (Addendum)
Mrs. Cynthia Wiley,  Thank you for coming in today  1. Diabetes type 2: Great job with diet, exercise, weight loss A1c is done Continue metformin 1000 mg twice daily I will work on opthalmology appt   2. Running: yes you can run. Start light.  3. Health care maintenances: Screening HIV Tdap done today  F/u in 3 months   Dr. Adrian Blackwater

## 2015-04-27 NOTE — Progress Notes (Signed)
F/U DM Glucose  99-171 since last visit

## 2015-04-27 NOTE — Progress Notes (Signed)
   Subjective:    Patient ID: Cynthia Wiley, female    DOB: 05-Jul-1966, 49 y.o.   MRN: [id number] CC: DM2 f/u  Spanish interpreter present  HPI  1. CHRONIC DIABETES  Disease Monitoring  Blood Sugar Ranges: 94-171  Polyuria: no   Visual problems: no   Medication Compliance: yes  Medication Side Effects  Hypoglycemia: no   Preventitive Health Care  Eye Exam: due    Foot Exam: done   Diet pattern: low carb   Exercise:  Walking    Soc Hx:  Non smoker  Review of Systems  Constitutional: Negative for fever and chills.  Respiratory: Negative for shortness of breath.   Cardiovascular: Negative for chest pain.       Objective:   Physical Exam BP 112/73 mmHg  Pulse 75  Temp(Src) 98.9 F (37.2 C) (Oral)  Resp 16  Ht 5\' 1"  (1.549 m)  Wt 142 lb (64.411 kg)  BMI 26.84 kg/m2  SpO2 98%  LMP 01/19/2013  Wt Readings from Last 3 Encounters:  04/27/15 142 lb (64.411 kg)  03/09/15 151 lb (68.493 kg)  02/26/15 151 lb 6.4 oz (68.675 kg)  General appearance: alert, cooperative and no distress Lungs: clear to auscultation bilaterally Heart: regular rate and rhythm, S1, S2 normal, no murmur, click, rub or gallop Extremities: extremities normal, atraumatic, no cyanosis or edema No foot lesions 2 + DP pulse  Lab Results  Component Value Date   HGBA1C 7.80 04/27/2015   CBG 138     Assessment & Plan:

## 2015-04-27 NOTE — Assessment & Plan Note (Signed)
Screening HIV done today  

## 2015-04-27 NOTE — Assessment & Plan Note (Signed)
1. Diabetes type 2: Great job with diet, exercise, weight loss A1c is done Continue metformin 1000 mg twice daily I will work on opthalmology appt

## 2015-04-27 NOTE — Assessment & Plan Note (Signed)
Health care maintenances: Screening HIV Tdap done today

## 2015-04-28 LAB — HIV ANTIBODY (ROUTINE TESTING W REFLEX): HIV: NONREACTIVE

## 2015-05-11 ENCOUNTER — Telehealth: Payer: Self-pay | Admitting: *Deleted

## 2015-05-11 NOTE — Telephone Encounter (Signed)
-----   Message from Boykin Nearing, MD sent at 04/29/2015  9:15 AM EDT ----- Screening HIV negative

## 2015-05-11 NOTE — Telephone Encounter (Signed)
Pt aware of result.

## 2015-06-01 ENCOUNTER — Other Ambulatory Visit: Payer: Self-pay | Admitting: Family Medicine

## 2015-06-02 ENCOUNTER — Telehealth: Payer: Self-pay | Admitting: Family Medicine

## 2015-06-02 ENCOUNTER — Other Ambulatory Visit: Payer: Self-pay | Admitting: *Deleted

## 2015-06-02 DIAGNOSIS — E039 Hypothyroidism, unspecified: Secondary | ICD-10-CM

## 2015-06-02 MED ORDER — LEVOTHYROXINE SODIUM 50 MCG PO TABS: 50.0000 ug | ORAL_TABLET | Freq: Every day | ORAL | Status: DC

## 2015-06-02 NOTE — Telephone Encounter (Signed)
Patient walked in today to pick up 30 day-scripts and was told she needed to schedule a 3 month f/u with PCP; patient states she was seen last month and would like for medication refills to be updated so she does not have to return next month; please f/u with patient

## 2015-06-02 NOTE — Telephone Encounter (Signed)
Rx Synthroid.  Pt need to return in 3 month for DM and TSH F/U

## 2015-06-02 NOTE — Telephone Encounter (Signed)
Patient is requesting that all medications be refilled with new dates; patient came in last month and states she was told

## 2015-07-17 ENCOUNTER — Other Ambulatory Visit: Payer: Self-pay | Admitting: Family Medicine

## 2015-07-17 DIAGNOSIS — Z1231 Encounter for screening mammogram for malignant neoplasm of breast: Secondary | ICD-10-CM

## 2015-07-21 ENCOUNTER — Ambulatory Visit: Payer: Self-pay | Attending: Family Medicine | Admitting: Family Medicine

## 2015-07-21 ENCOUNTER — Encounter: Payer: Self-pay | Admitting: Family Medicine

## 2015-07-21 VITALS — BP 119/75 | HR 73 | Temp 97.9°F | Resp 16 | Ht 58.5 in | Wt 142.0 lb

## 2015-07-21 DIAGNOSIS — E559 Vitamin D deficiency, unspecified: Secondary | ICD-10-CM

## 2015-07-21 DIAGNOSIS — E119 Type 2 diabetes mellitus without complications: Secondary | ICD-10-CM

## 2015-07-21 DIAGNOSIS — E039 Hypothyroidism, unspecified: Secondary | ICD-10-CM

## 2015-07-21 DIAGNOSIS — E118 Type 2 diabetes mellitus with unspecified complications: Secondary | ICD-10-CM

## 2015-07-21 LAB — GLUCOSE, POCT (MANUAL RESULT ENTRY): POC Glucose: 109 mg/dl — AB (ref 70–99)

## 2015-07-21 LAB — POCT GLYCOSYLATED HEMOGLOBIN (HGB A1C): Hemoglobin A1C: 8.2

## 2015-07-21 MED ORDER — LEVOTHYROXINE SODIUM 50 MCG PO TABS: 50.0000 ug | ORAL_TABLET | Freq: Every day | ORAL | Status: DC

## 2015-07-21 MED ORDER — METFORMIN HCL 1000 MG PO TABS: 1000.0000 mg | ORAL_TABLET | Freq: Two times a day (BID) | ORAL | Status: DC

## 2015-07-21 NOTE — Progress Notes (Signed)
   Subjective:    Patient ID: Cynthia Wiley, female    DOB: August 03, 1966, 49 y.o.   MRN: [id number] CC: f/u diabetes and hypothyroidism  HPI Spanish interpreter on the phone, ID # [id number]  1. CHRONIC DIABETES  Disease Monitoring  Blood Sugar Ranges: 797-138  Polyuria: no   Visual problems: no   Medication Compliance: yes  Medication Side Effects  Hypoglycemia: no   Preventitive Health Care  Eye Exam: due   Foot Exam: not due   Diet pattern: not eating low carb   Exercise: has slacked off   2. Hypothyroidism: taking synthroid. Denies trouble swallowing, palpitations, CP and weight changes.   3. Vit D: took vit D supplement. Due for recheck.   Social History  Substance Use Topics  . Smoking status: Never Smoker   . Smokeless tobacco: Never Used  . Alcohol Use: No   Review of Systems  Constitutional: Negative for fever and chills.  Eyes: Negative for visual disturbance.  Respiratory: Negative for shortness of breath.   Cardiovascular: Negative for chest pain.  Gastrointestinal: Negative for abdominal pain and blood in stool.  Musculoskeletal: Negative for back pain and arthralgias.  Skin: Positive for rash (had anterior neck rash with ithching that is improving ).  Allergic/Immunologic: Negative for immunocompromised state.  Hematological: Negative for adenopathy. Does not bruise/bleed easily.  Psychiatric/Behavioral: Negative for suicidal ideas and dysphoric mood.       Objective:   Physical Exam  Constitutional: She is oriented to person, place, and time. She appears well-developed and well-nourished. No distress.  Neck: No thyroid mass and no thyromegaly present.  Cardiovascular: Normal rate, regular rhythm, normal heart sounds and intact distal pulses.   Pulmonary/Chest: Effort normal and breath sounds normal.  Musculoskeletal: She exhibits no edema.  Neurological: She is alert and oriented to person, place, and time.  Skin: Skin is warm and dry. Rash noted.      Feet normal w/o rash, lesions, callus or ulcerations   Psychiatric: She has a normal mood and affect.  BP 119/75 mmHg  Pulse 73  Temp(Src) 97.9 F (36.6 C) (Oral)  Resp 16  Ht 4' 10.5" (1.486 m)  Wt 142 lb (64.411 kg)  BMI 29.17 kg/m2  SpO2 99%  LMP 01/19/2013  Wt Readings from Last 3 Encounters:  07/21/15 142 lb (64.411 kg)  04/27/15 142 lb (64.411 kg)  03/09/15 151 lb (68.493 kg)    Lab Results  Component Value Date   TSH 2.637 01/22/2015   Lab Results  Component Value Date   HGBA1C 8.20 07/21/2015   Previous A1c 7.8  CBG 109     Assessment & Plan:

## 2015-07-21 NOTE — Assessment & Plan Note (Signed)
Vit D deficiency Rechecking vit D level

## 2015-07-21 NOTE — Assessment & Plan Note (Signed)
Hypothyroidism: Checking TSH Refilled synthroid

## 2015-07-21 NOTE — Patient Instructions (Signed)
Cynthia Wiley,  Thank you for coming in today   1. Diabetes: A1c is a bit elevated Continue metformin 1000 mg twice daily Increase exercise Look at ways to reduce carbs in your diet Replace with good fats (nuts, coconut oil) Replace with lean protein carbs mostly from vegetables  2. Hypothyroidism: Checking TSH Refilled synthroid  3. Vit D deficiency Rechecking vit D level   Dr. Adrian Blackwater

## 2015-07-21 NOTE — Assessment & Plan Note (Signed)
1. Diabetes: A1c is a bit elevated Continue metformin 1000 mg twice daily Increase exercise Look at ways to reduce carbs in your diet Replace with good fats (nuts, coconut oil) Replace with lean protein carbs mostly from vegetables

## 2015-07-21 NOTE — Progress Notes (Signed)
F/U DM, Thyroid  Glucose running at home lowest 97 highest 138 Taking medication as prescribed Requesting Refills Synthroid

## 2015-07-22 LAB — TSH: TSH: 1.212 u[IU]/mL (ref 0.350–4.500)

## 2015-07-22 LAB — VITAMIN D 25 HYDROXY (VIT D DEFICIENCY, FRACTURES): Vit D, 25-Hydroxy: 31 ng/mL (ref 30–100)

## 2015-07-29 ENCOUNTER — Telehealth: Payer: Self-pay | Admitting: Family Medicine

## 2015-07-29 NOTE — Telephone Encounter (Signed)
Patient came into office requesting lab results, please f/u

## 2015-07-31 NOTE — Telephone Encounter (Signed)
Patient came in requesting lab results. °Please follow up. ° °

## 2015-08-04 ENCOUNTER — Ambulatory Visit: Payer: Self-pay | Attending: Family Medicine

## 2015-08-05 NOTE — Telephone Encounter (Signed)
-----   Message from Boykin Nearing, MD sent at 07/22/2015  9:16 AM EDT ----- Vit D normal TSH normal  Continue current treatment plan

## 2015-08-05 NOTE — Telephone Encounter (Signed)
Date of Birth verified by Pt  Normal lab results given to pt  Pt verbalized understanding Information given in Spanish

## 2015-08-12 ENCOUNTER — Ambulatory Visit (HOSPITAL_COMMUNITY)
Admission: RE | Admit: 2015-08-12 | Discharge: 2015-08-12 | Disposition: A | Discharge status: Home / Self Care | Payer: No Typology Code available for payment source | Source: Ambulatory Visit | Attending: Family Medicine | Admitting: Family Medicine

## 2015-08-12 DIAGNOSIS — Z1231 Encounter for screening mammogram for malignant neoplasm of breast: Secondary | ICD-10-CM | POA: Insufficient documentation

## 2015-08-18 ENCOUNTER — Telehealth: Payer: Self-pay | Admitting: *Deleted

## 2015-08-18 NOTE — Telephone Encounter (Signed)
Date of birth verified by pt Negative mammogram report given to pt Results given to Pt in [place]

## 2015-08-18 NOTE — Telephone Encounter (Signed)
-----   Message from Arnoldo Morale, MD sent at 08/13/2015  9:53 PM EDT ----- Please inform her of negative mammogram report

## 2015-11-09 ENCOUNTER — Encounter: Payer: Self-pay | Admitting: Family Medicine

## 2015-11-09 ENCOUNTER — Ambulatory Visit: Payer: No Typology Code available for payment source | Attending: Family Medicine | Admitting: Family Medicine

## 2015-11-09 VITALS — BP 134/75 | HR 77 | Temp 98.2°F | Resp 16 | Ht 58.5 in | Wt 139.0 lb

## 2015-11-09 DIAGNOSIS — E119 Type 2 diabetes mellitus without complications: Secondary | ICD-10-CM

## 2015-11-09 DIAGNOSIS — Z794 Long term (current) use of insulin: Secondary | ICD-10-CM | POA: Insufficient documentation

## 2015-11-09 DIAGNOSIS — Z79899 Other long term (current) drug therapy: Secondary | ICD-10-CM | POA: Insufficient documentation

## 2015-11-09 DIAGNOSIS — Z7984 Long term (current) use of oral hypoglycemic drugs: Secondary | ICD-10-CM | POA: Insufficient documentation

## 2015-11-09 DIAGNOSIS — E118 Type 2 diabetes mellitus with unspecified complications: Secondary | ICD-10-CM

## 2015-11-09 DIAGNOSIS — E039 Hypothyroidism, unspecified: Secondary | ICD-10-CM

## 2015-11-09 DIAGNOSIS — Z0000 Encounter for general adult medical examination without abnormal findings: Secondary | ICD-10-CM

## 2015-11-09 LAB — GLUCOSE, POCT (MANUAL RESULT ENTRY): POC Glucose: 128 mg/dL — AB (ref 70–99)

## 2015-11-09 LAB — POCT GLYCOSYLATED HEMOGLOBIN (HGB A1C): Hemoglobin A1C: 7.4

## 2015-11-09 MED ORDER — ATORVASTATIN CALCIUM 40 MG PO TABS: 40.0000 mg | ORAL_TABLET | Freq: Every day | ORAL | Status: DC

## 2015-11-09 MED ORDER — LEVOTHYROXINE SODIUM 50 MCG PO TABS: 50.0000 ug | ORAL_TABLET | Freq: Every day | ORAL | Status: DC

## 2015-11-09 MED ORDER — METFORMIN HCL 1000 MG PO TABS: 1000.0000 mg | ORAL_TABLET | Freq: Two times a day (BID) | ORAL | Status: DC

## 2015-11-09 NOTE — Progress Notes (Signed)
F/U DM Taking medication as prescribed  Glucose running 98-138 No tobacco user  No suicide thought in the past two weeks

## 2015-11-09 NOTE — Assessment & Plan Note (Signed)
A: diabetes, improved control Med: compliant P: Continue current regimen

## 2015-11-09 NOTE — Progress Notes (Signed)
Subjective:    Patient ID: Cynthia Wiley, female    DOB: [birth year], 49 y.o.   MRN: [id number] CC: f/u diabetes and hypothyroidism  HPI Spanish interpreter used   1. CHRONIC DIABETES  Disease Monitoring  Blood Sugar Ranges: 98-138  Polyuria: no   Visual problems: no   Medication Compliance: yes  Medication Side Effects  Hypoglycemia: no   Preventitive Health Care  Eye Exam: due   Foot Exam: not due   Diet pattern: not eating low carb   Exercise: yes   2. Hypothyroidism: taking synthroid. Denies trouble swallowing, palpitations, CP and weight changes.   Current Outpatient Prescriptions on File Prior to Visit  Medication Sig Dispense Refill  . atorvastatin (LIPITOR) 40 MG tablet Take 1 tablet (40 mg total) by mouth daily. 90 tablet 3  . Blood Glucose Monitoring Suppl (TRUE METRIX METER) W/DEVICE KIT 1 each by Does not apply route as needed. 1 kit 0  . glucose blood (TRUE METRIX BLOOD GLUCOSE TEST) test strip 1 each by Other route 2 (two) times daily. 100 each 11  . levothyroxine (SYNTHROID, LEVOTHROID) 50 MCG tablet Take 1 tablet (50 mcg total) by mouth daily. 30 tablet 5  . metFORMIN (GLUCOPHAGE) 1000 MG tablet Take 1 tablet (1,000 mg total) by mouth 2 (two) times daily with a meal. 60 tablet 5  . TRUEPLUS LANCETS 28G MISC 1 each by Does not apply route 2 (two) times daily. 100 each 11   No current facility-administered medications on file prior to visit.    Social History  Substance Use Topics  . Smoking status: Never Smoker   . Smokeless tobacco: Never Used  . Alcohol Use: No   Review of Systems  Constitutional: Negative for fever and chills.  Eyes: Negative for visual disturbance.  Respiratory: Negative for shortness of breath.   Cardiovascular: Negative for chest pain.  Gastrointestinal: Negative for abdominal pain and blood in stool.  Musculoskeletal: Negative for back pain and arthralgias.  Skin: Negative for rash.  Allergic/Immunologic: Negative  for immunocompromised state.  Hematological: Negative for adenopathy. Does not bruise/bleed easily.  Psychiatric/Behavioral: Negative for suicidal ideas and dysphoric mood.       Objective:   Physical Exam  Constitutional: She is oriented to person, place, and time. She appears well-developed and well-nourished. No distress.  Neck: No thyroid mass and no thyromegaly present.  Cardiovascular: Normal rate, regular rhythm, normal heart sounds and intact distal pulses.   Pulmonary/Chest: Effort normal and breath sounds normal.  Musculoskeletal: She exhibits no edema.  Neurological: She is alert and oriented to person, place, and time.  Skin: Skin is warm and dry. No rash noted.  Psychiatric: She has a normal mood and affect.  BP 134/75 mmHg  Pulse 77  Temp(Src) 98.2 F (36.8 C) (Oral)  Resp 16  Ht 4' 10.5" (1.486 m)  Wt 139 lb (63.05 kg)  BMI 28.55 kg/m2  SpO2 99%  LMP 01/19/2013  Wt Readings from Last 3 Encounters:  11/09/15 139 lb (63.05 kg)  07/21/15 142 lb (64.411 kg)  04/27/15 142 lb (64.411 kg)    Lab Results  Component Value Date   TSH 1.212 07/21/2015   Lab Results  Component Value Date   HGBA1C 8.20 07/21/2015   Lab Results  Component Value Date   HGBA1C 7.4 11/09/2015    CBG 128    Assessment & Plan:  Cynthia Wiley was seen today for diabetes.  Diagnoses and all orders for this visit:  Type 2 diabetes  mellitus without complication, unspecified long term insulin use status (HCC) -     POCT glycosylated hemoglobin (Hb A1C) -     POCT glucose (manual entry) -     Ambulatory referral to Ophthalmology  Healthcare maintenance -     Flu Vaccine QUAD 36+ mos IM  Type 2 diabetes mellitus with complication, without long-term current use of insulin (HCC) -     atorvastatin (LIPITOR) 40 MG tablet; Take 1 tablet (40 mg total) by mouth daily. -     metFORMIN (GLUCOPHAGE) 1000 MG tablet; Take 1 tablet (1,000 mg total) by mouth 2 (two) times daily with a  meal.  Hypothyroidism, unspecified hypothyroidism type -     levothyroxine (SYNTHROID, LEVOTHROID) 50 MCG tablet; Take 1 tablet (50 mcg total) by mouth daily.

## 2015-11-09 NOTE — Patient Instructions (Addendum)
Cynthia Wiley was seen today for diabetes.  Diagnoses and all orders for this visit:  Type 2 diabetes mellitus without complication, unspecified long term insulin use status (HCC) -     POCT glycosylated hemoglobin (Hb A1C) -     POCT glucose (manual entry) -     Ambulatory referral to Ophthalmology  Healthcare maintenance -     Flu Vaccine QUAD 36+ mos IM  Type 2 diabetes mellitus with complication, without long-term current use of insulin (HCC) -     atorvastatin (LIPITOR) 40 MG tablet; Take 1 tablet (40 mg total) by mouth daily. -     metFORMIN (GLUCOPHAGE) 1000 MG tablet; Take 1 tablet (1,000 mg total) by mouth 2 (two) times daily with a meal.  Hypothyroidism, unspecified hypothyroidism type -     levothyroxine (SYNTHROID, LEVOTHROID) 50 MCG tablet; Take 1 tablet (50 mcg total) by mouth daily.   Clifton James,  Sus diabetes es Risk manager en 3 meses  Dr. Adrian Blackwater

## 2016-02-18 ENCOUNTER — Ambulatory Visit: Payer: No Typology Code available for payment source | Attending: Family Medicine | Admitting: Family Medicine

## 2016-02-18 ENCOUNTER — Encounter: Payer: Self-pay | Admitting: Family Medicine

## 2016-02-18 VITALS — BP 127/77 | HR 88 | Temp 98.7°F | Resp 16 | Ht 58.5 in | Wt 145.0 lb

## 2016-02-18 DIAGNOSIS — Z7984 Long term (current) use of oral hypoglycemic drugs: Secondary | ICD-10-CM | POA: Insufficient documentation

## 2016-02-18 DIAGNOSIS — Z79899 Other long term (current) drug therapy: Secondary | ICD-10-CM | POA: Insufficient documentation

## 2016-02-18 DIAGNOSIS — E119 Type 2 diabetes mellitus without complications: Secondary | ICD-10-CM | POA: Insufficient documentation

## 2016-02-18 DIAGNOSIS — E039 Hypothyroidism, unspecified: Secondary | ICD-10-CM

## 2016-02-18 LAB — POCT GLYCOSYLATED HEMOGLOBIN (HGB A1C): Hemoglobin A1C: 7.9

## 2016-02-18 LAB — GLUCOSE, POCT (MANUAL RESULT ENTRY): POC GLUCOSE: 105 mg/dL — AB (ref 70–99)

## 2016-02-18 MED ORDER — ATORVASTATIN CALCIUM 40 MG PO TABS: 40.0000 mg | ORAL_TABLET | Freq: Every day | ORAL | Status: DC

## 2016-02-18 MED ORDER — METFORMIN HCL 1000 MG PO TABS: 1000.0000 mg | ORAL_TABLET | Freq: Two times a day (BID) | ORAL | Status: DC

## 2016-02-18 MED ORDER — LEVOTHYROXINE SODIUM 50 MCG PO TABS: 50.0000 ug | ORAL_TABLET | Freq: Every day | ORAL | Status: DC

## 2016-02-18 NOTE — Assessment & Plan Note (Signed)
A: decline due to lack of exercise and weight gain P: Exercise encouraged Continue metformin F/u in 5 months

## 2016-02-18 NOTE — Assessment & Plan Note (Signed)
A: euthyroid on exam P: F/u in 5 months for yearly TSH Refilled synthroid

## 2016-02-18 NOTE — Patient Instructions (Addendum)
Zuleyma was seen today for diabetes.  Diagnoses and all orders for this visit:  Type 2 diabetes mellitus without complication, unspecified long term insulin use status (HCC) -     POCT glycosylated hemoglobin (Hb A1C) -     POCT glucose (manual entry) -     Microalbumin/Creatinine Ratio, Urine -     metFORMIN (GLUCOPHAGE) 1000 MG tablet; Take 1 tablet (1,000 mg total) by mouth 2 (two) times daily with a meal. -     atorvastatin (LIPITOR) 40 MG tablet; Take 1 tablet (40 mg total) by mouth daily.  Hypothyroidism, unspecified hypothyroidism type -     levothyroxine (SYNTHROID, LEVOTHROID) 50 MCG tablet; Take 1 tablet (50 mcg total) by mouth daily.   F/u in 5 months for TSH check and diabetes  Dr. Adrian Blackwater

## 2016-02-18 NOTE — Progress Notes (Signed)
Subjective:  Patient ID: Cynthia Wiley, female    DOB: 1966-05-02  Age: 50 y.o. MRN: [id number]  Spanish interpreter used   CC: Diabetes   HPI [place] presents for    1. CHRONIC DIABETES  Disease Monitoring  Blood Sugar Ranges:   Polyuria: no   Visual problems: no   Medication Compliance: yes  Medication Side Effects  Hypoglycemia: no   Preventitive Health Care  Eye Exam: done in 12/2014   Foot Exam: done today   Diet pattern: low carb   Exercise: stopped in winter   2. Hypothyroidism: no swelling. Gained 6 #. Lack of exercise. No neck pain or trouble swallowing. No energy changes.   Social History  Substance Use Topics  . Smoking status: Never Smoker   . Smokeless tobacco: Never Used  . Alcohol Use: No    Outpatient Prescriptions Prior to Visit  Medication Sig Dispense Refill  . atorvastatin (LIPITOR) 40 MG tablet Take 1 tablet (40 mg total) by mouth daily. 30 tablet 5  . Blood Glucose Monitoring Suppl (TRUE METRIX METER) W/DEVICE KIT 1 each by Does not apply route as needed. 1 kit 0  . glucose blood (TRUE METRIX BLOOD GLUCOSE TEST) test strip 1 each by Other route 2 (two) times daily. 100 each 11  . levothyroxine (SYNTHROID, LEVOTHROID) 50 MCG tablet Take 1 tablet (50 mcg total) by mouth daily. 30 tablet 5  . metFORMIN (GLUCOPHAGE) 1000 MG tablet Take 1 tablet (1,000 mg total) by mouth 2 (two) times daily with a meal. 60 tablet 5  . TRUEPLUS LANCETS 28G MISC 1 each by Does not apply route 2 (two) times daily. 100 each 11   No facility-administered medications prior to visit.    ROS Review of Systems  Constitutional: Negative for fever and chills.  Eyes: Negative for visual disturbance.  Respiratory: Negative for shortness of breath.   Cardiovascular: Negative for chest pain.  Gastrointestinal: Negative for abdominal pain and blood in stool.  Musculoskeletal: Positive for myalgias (R anterior foot x 1-3 times starting 5 days ago  at night). Negative for back pain and arthralgias.  Skin: Negative for rash.  Allergic/Immunologic: Negative for immunocompromised state.  Hematological: Negative for adenopathy. Does not bruise/bleed easily.  Psychiatric/Behavioral: Negative for suicidal ideas and dysphoric mood.    Objective:  BP 127/77 mmHg  Pulse 88  Temp(Src) 98.7 F (37.1 C) (Oral)  Resp 16  Ht 4' 10.5" (1.486 m)  Wt 145 lb (65.772 kg)  BMI 29.79 kg/m2  SpO2 99%  LMP 01/19/2013  BP/Weight 02/18/2016 11/09/2015 7/86/7544  Systolic BP 920 100 712  Diastolic BP 77 75 75  Wt. (Lbs) 145 139 142  BMI 29.79 28.55 29.17   Physical Exam  Constitutional: She is oriented to person, place, and time. She appears well-developed and well-nourished. No distress.  HENT:  Head: Normocephalic and atraumatic.  Cardiovascular: Normal rate, regular rhythm, normal heart sounds and intact distal pulses.   Pulmonary/Chest: Effort normal and breath sounds normal.  Musculoskeletal: She exhibits no edema.  Neurological: She is alert and oriented to person, place, and time.  Skin: Skin is warm and dry. No rash noted.  Psychiatric: She has a normal mood and affect.   Lab Results  Component Value Date   HGBA1C 7.9 02/18/2016   CBG 105 Assessment & Plan:   Roslind was seen today for diabetes.  Diagnoses and all orders for this visit:  Type 2 diabetes mellitus without complication, unspecified long term  insulin use status (HCC) -     POCT glycosylated hemoglobin (Hb A1C) -     POCT glucose (manual entry) -     Microalbumin/Creatinine Ratio, Urine -     metFORMIN (GLUCOPHAGE) 1000 MG tablet; Take 1 tablet (1,000 mg total) by mouth 2 (two) times daily with a meal. -     atorvastatin (LIPITOR) 40 MG tablet; Take 1 tablet (40 mg total) by mouth daily.  Hypothyroidism, unspecified hypothyroidism type -     levothyroxine (SYNTHROID, LEVOTHROID) 50 MCG tablet; Take 1 tablet (50 mcg total) by mouth daily.   No orders of the defined  types were placed in this encounter.    Follow-up: No Follow-up on file.   Boykin Nearing MD

## 2016-02-19 LAB — MICROALBUMIN / CREATININE URINE RATIO
Creatinine, Urine: 18 mg/dL — ABNORMAL LOW (ref 20–320)
Microalb Creat Ratio: 11 mcg/mg creat (ref <30)
Microalb, Ur: 0.2 mg/dL

## 2016-02-24 ENCOUNTER — Telehealth: Payer: Self-pay | Admitting: *Deleted

## 2016-02-24 NOTE — Telephone Encounter (Signed)
-----   Message from Boykin Nearing, MD sent at 02/19/2016  8:39 AM EDT ----- Normal urine microalbumin

## 2016-02-24 NOTE — Telephone Encounter (Signed)
Date of birth verified by pt  Normal results given  Pt verbalized understanding  Information given in [place]

## 2016-07-21 MED FILL — metFORMIN HCL 1000 MG TABS: 1000 | 30 days supply | Qty: 60 | Fill #0

## 2016-07-21 MED FILL — LEVOTHYROXINE 50 MCG TABLET: 50 | 30 days supply | Qty: 30 | Fill #0

## 2016-07-25 ENCOUNTER — Encounter: Payer: Self-pay | Admitting: Family Medicine

## 2016-07-25 ENCOUNTER — Ambulatory Visit: Payer: Self-pay | Attending: Family Medicine | Admitting: Family Medicine

## 2016-07-25 ENCOUNTER — Other Ambulatory Visit: Payer: Self-pay | Admitting: *Deleted

## 2016-07-25 VITALS — BP 132/82 | HR 83 | Temp 98.0°F | Ht <= 58 in | Wt 144.2 lb

## 2016-07-25 DIAGNOSIS — E11649 Type 2 diabetes mellitus with hypoglycemia without coma: Secondary | ICD-10-CM | POA: Insufficient documentation

## 2016-07-25 DIAGNOSIS — E039 Hypothyroidism, unspecified: Secondary | ICD-10-CM

## 2016-07-25 DIAGNOSIS — Z0000 Encounter for general adult medical examination without abnormal findings: Secondary | ICD-10-CM

## 2016-07-25 DIAGNOSIS — Z79899 Other long term (current) drug therapy: Secondary | ICD-10-CM | POA: Insufficient documentation

## 2016-07-25 DIAGNOSIS — E119 Type 2 diabetes mellitus without complications: Secondary | ICD-10-CM

## 2016-07-25 LAB — POCT GLYCOSYLATED HEMOGLOBIN (HGB A1C): HEMOGLOBIN A1C: 8

## 2016-07-25 LAB — GLUCOSE, POCT (MANUAL RESULT ENTRY): POC Glucose: 120 mg/dl — AB (ref 70–99)

## 2016-07-25 MED ORDER — GLIMEPIRIDE 2 MG PO TABS: 2.0000 mg | ORAL_TABLET | Freq: Every day | ORAL | 3 refills | Status: DC

## 2016-07-25 MED ORDER — TRUEPLUS LANCETS 28G MISC
1.0000 | Freq: Two times a day (BID) | 11 refills | Status: DC
Start: 2016-07-25 — End: 2017-12-11

## 2016-07-25 MED ORDER — GLUCOSE BLOOD VI STRP: 1.0000 | ORAL_STRIP | Freq: Two times a day (BID) | 11 refills | Status: DC

## 2016-07-25 MED FILL — TRUEplus LANCETS 28G MISC: 50 days supply | Qty: 100 | Fill #0

## 2016-07-25 MED FILL — TRUE METRIX TEST STRIP: 50 days supply | Qty: 100 | Fill #0

## 2016-07-25 MED FILL — ?GLIMEPIRIDE 2 MG TABLET: 2 | 30 days supply | Qty: 30 | Fill #0

## 2016-07-25 NOTE — Assessment & Plan Note (Signed)
A1c above goal Med compliant Plan: Continue metformin Add amaryl

## 2016-07-25 NOTE — Patient Instructions (Addendum)
Cynthia Wiley was seen today for diabetes and hypothyroidism.  Diagnoses and all orders for this visit:  Healthcare maintenance -     Flu Vaccine QUAD 36+ mos IM  Type 2 diabetes mellitus without complication, without long-term current use of insulin (HCC) -     HgB A1c -     Glucose (CBG) -     glimepiride (AMARYL) 2 MG tablet; Take 1 tablet (2 mg total) by mouth daily before breakfast.  Hypothyroidism, unspecified hypothyroidism type -     TSH  Other orders -     Cancel: POCT glucose (manual entry) -     Cancel: POCT glycosylated hemoglobin (Hb A1C)    F/u in 3 months for diabetes   Dr. Adrian Blackwater

## 2016-07-25 NOTE — Assessment & Plan Note (Signed)
Euthyroid on exam Checking TSH

## 2016-07-25 NOTE — Progress Notes (Signed)
Subjective:  Patient ID: Cynthia Wiley, female    DOB: 06-05-1966  Age: 50 y.o. MRN: [id number]  CC: Diabetes and Hypothyroidism   HPI [facility] Cynthia Wiley presents for    1. CHRONIC DIABETES  Disease Monitoring  Blood Sugar Ranges:   Polyuria: no   Visual problems: no   Medication Compliance: yes metformin BID  Medication Side Effects  Hypoglycemia: no   Preventitive Health Care  Eye Exam: done in 12/2015   Foot Exam: done today   Diet pattern: low carb   Exercise:   2. Hypothyroidism: compliant with AM synthroid.  no swelling.  No neck pain or trouble swallowing. No energy changes. No weight changes.   Social History  Substance Use Topics  . Smoking status: Never Smoker  . Smokeless tobacco: Never Used  . Alcohol use No    Outpatient Medications Prior to Visit  Medication Sig Dispense Refill  . atorvastatin (LIPITOR) 40 MG tablet Take 1 tablet (40 mg total) by mouth daily. 30 tablet 5  . Blood Glucose Monitoring Suppl (TRUE METRIX METER) W/DEVICE KIT 1 each by Does not apply route as needed. 1 kit 0  . glucose blood (TRUE METRIX BLOOD GLUCOSE TEST) test strip 1 each by Other route 2 (two) times daily. 100 each 11  . levothyroxine (SYNTHROID, LEVOTHROID) 50 MCG tablet Take 1 tablet (50 mcg total) by mouth daily. 30 tablet 5  . metFORMIN (GLUCOPHAGE) 1000 MG tablet Take 1 tablet (1,000 mg total) by mouth 2 (two) times daily with a meal. 60 tablet 5  . TRUEPLUS LANCETS 28G MISC 1 each by Does not apply route 2 (two) times daily. 100 each 11   No facility-administered medications prior to visit.     ROS Review of Systems  Constitutional: Negative for chills and fever.  Eyes: Negative for visual disturbance.  Respiratory: Negative for shortness of breath.   Cardiovascular: Negative for chest pain.  Gastrointestinal: Negative for abdominal pain and blood in stool.  Musculoskeletal: Positive for myalgias (R anterior foot x 1-3 times starting 5 days  ago at night). Negative for arthralgias and back pain.  Skin: Negative for rash.  Allergic/Immunologic: Negative for immunocompromised state.  Hematological: Negative for adenopathy. Does not bruise/bleed easily.  Psychiatric/Behavioral: Negative for dysphoric mood and suicidal ideas.    Objective:  BP 132/82 (BP Location: Left Arm, Patient Position: Sitting, Cuff Size: Small)   Pulse 83   Temp 98 F (36.7 C) (Oral)   Ht '4\' 10"'  (1.473 m)   Wt 144 lb 3.2 oz (65.4 kg)   LMP 01/19/2013   SpO2 98%   BMI 30.14 kg/m   BP/Weight 07/25/2016 02/18/2016 42/06/622  Systolic BP 762 831 517  Diastolic BP 82 77 75  Wt. (Lbs) 144.2 145 139  BMI 30.14 29.79 28.55   Physical Exam  Constitutional: She is oriented to person, place, and time. She appears well-developed and well-nourished. No distress.  HENT:  Head: Normocephalic and atraumatic.  Cardiovascular: Normal rate, regular rhythm, normal heart sounds and intact distal pulses.   Pulmonary/Chest: Effort normal and breath sounds normal.  Musculoskeletal: She exhibits no edema.  Neurological: She is alert and oriented to person, place, and time.  Skin: Skin is warm and dry. No rash noted.  Psychiatric: She has a normal mood and affect.   Lab Results  Component Value Date   HGBA1C 7.9 02/18/2016   Lab Results  Component Value Date   HGBA1C 7.9 02/18/2016   Lab Results  Component Value Date   TSH 1.212 07/21/2015    CBG 120 Assessment & Plan:   Cynthia Wiley was seen today for diabetes and hypothyroidism.  Diagnoses and all orders for this visit:  Healthcare maintenance -     Flu Vaccine QUAD 36+ mos IM  Type 2 diabetes mellitus without complication, without long-term current use of insulin (HCC) -     HgB A1c -     Glucose (CBG) -     glimepiride (AMARYL) 2 MG tablet; Take 1 tablet (2 mg total) by mouth daily before breakfast.  Hypothyroidism, unspecified hypothyroidism type -     TSH  Other orders -     Cancel: POCT glucose  (manual entry) -     Cancel: POCT glycosylated hemoglobin (Hb A1C)   No orders of the defined types were placed in this encounter.   Follow-up: No Follow-up on file.   Boykin Nearing MD

## 2016-07-26 LAB — TSH: TSH: 2.17 mIU/L

## 2016-07-26 MED ORDER — LEVOTHYROXINE SODIUM 50 MCG PO TABS: 50.0000 ug | ORAL_TABLET | Freq: Every day | ORAL | 5 refills | Status: DC

## 2016-07-26 MED ORDER — LEVOTHYROXINE SODIUM 50 MCG PO TABS: 50.0000 ug | ORAL_TABLET | Freq: Every day | ORAL | 11 refills | Status: DC

## 2016-07-26 NOTE — Addendum Note (Signed)
Addended by: Boykin Nearing on: 07/26/2016 09:18 AM   Modules accepted: Orders

## 2016-07-27 ENCOUNTER — Telehealth: Payer: Self-pay

## 2016-07-27 NOTE — Telephone Encounter (Signed)
Patient does not have a voicemail set up. Letter will be mailed out today. 8/23

## 2016-08-22 MED FILL — LEVOTHYROXINE 50 MCG TABLET: 50 | 30 days supply | Qty: 30 | Fill #1

## 2016-08-22 MED FILL — ?METFORMIN HCL 1,000 MG TAB: 1000 | 30 days supply | Qty: 60 | Fill #1

## 2016-08-25 MED FILL — ?GLIMEPIRIDE 2 MG TABLET: 2 | 30 days supply | Qty: 30 | Fill #1

## 2016-09-20 MED FILL — LEVOTHYROXINE 50 MCG TABLET: 50 | 30 days supply | Qty: 30 | Fill #2

## 2016-09-20 MED FILL — metFORMIN HCL 1000 MG TABS: 1000 | 30 days supply | Qty: 60 | Fill #2

## 2016-09-20 MED FILL — ?GLIMEPIRIDE 2 MG TABLET: 2 | 30 days supply | Qty: 30 | Fill #2

## 2016-10-11 ENCOUNTER — Telehealth: Payer: Self-pay | Admitting: Family Medicine

## 2016-10-11 DIAGNOSIS — E119 Type 2 diabetes mellitus without complications: Secondary | ICD-10-CM

## 2016-10-11 MED ORDER — GLIMEPIRIDE 2 MG PO TABS: 2.0000 mg | ORAL_TABLET | Freq: Every day | ORAL | 3 refills | Status: DC

## 2016-10-11 MED ORDER — ATORVASTATIN CALCIUM 40 MG PO TABS: 40.0000 mg | ORAL_TABLET | Freq: Every day | ORAL | 3 refills | Status: DC

## 2016-10-11 MED ORDER — METFORMIN HCL 1000 MG PO TABS: 1000.0000 mg | ORAL_TABLET | Freq: Two times a day (BID) | ORAL | 3 refills | Status: DC

## 2016-10-11 MED FILL — ?ATORVASTATIN 40MG TABLET: 40 | 30 days supply | Qty: 30 | Fill #0

## 2016-10-11 NOTE — Telephone Encounter (Signed)
Requested medications refilled 

## 2016-10-11 NOTE — Telephone Encounter (Signed)
Pt. Called requesting a refill on the following medications:    metFORMIN (GLUCOPHAGE) 1000 MG tablet   atorvastatin (LIPITOR) 40 MG tablet   glimepiride (AMARYL) 2 MG tablet    Please f/u

## 2016-10-18 ENCOUNTER — Encounter: Payer: Self-pay | Admitting: Pediatric Intensive Care

## 2016-10-19 MED FILL — ?GLIMEPIRIDE 2 MG TABLET: 2 | 30 days supply | Qty: 30 | Fill #3

## 2016-10-19 MED FILL — ?METFORMIN HCL 1,000 MG TAB: 1000 | 30 days supply | Qty: 60 | Fill #3

## 2016-10-19 MED FILL — LEVOTHYROXINE 50 MCG TABLET: 50 | 30 days supply | Qty: 30 | Fill #3

## 2016-11-09 NOTE — Congregational Nurse Program (Signed)
Congregational Nurse Program Note  Date of Encounter: 10/18/2016  Past Medical History: Past Medical History:  Diagnosis Date  . Diabetes mellitus 01/07/2008  . Hyperlipidemia   . Hypothyroidism   . Pelvic pain in female   . Thyroid disease 2010    Encounter Details:     CNP Questionnaire - 10/18/16 1600      Patient Demographics   Is this a new or existing patient? New   Patient is considered a/an Immigrant   Race Latino/Hispanic     Patient Assistance   Location of Patient Assistance [facility]   Patient's financial/insurance status [facility]   Uninsured Patient ([facility]) Yes   Interventions Appt. has been completed   Patient referred to apply for the following financial assistance Not Applicable   Food insecurities addressed Not Applicable   Transportation assistance No   Assistance securing medications No   Educational health offerings Diabetes     Encounter Details   Primary purpose of visit Chronic Illness/Condition Visit;Other   Was an Emergency Department visit averted? Not Applicable   Does patient have a medical provider? Yes   Patient referred to Follow up with established PCP   Was a mental health screening completed? (GAINS tool) No   Does patient have dental issues? No   Does patient have vision issues? No   Does your patient have an abnormal blood pressure today? No   Since previous encounter, have you referred patient for abnormal blood pressure that resulted in a new diagnosis or medication change? No   Does your patient have an abnormal blood glucose today? Yes   Since previous encounter, have you referred patient for abnormal blood glucose that resulted in a new diagnosis or medication change? No   Was there a life-saving intervention made? No     New client via interpreter Cynthia Wiley- states that she has diabetes and is followed at [facility]. She has a new glucometer and requests assistance with set up. Reviewed glucometer  function with client. Client verbalizes understanding. BG per client glucometer was 250. Client has follow up appointment next month with [facility]. Encouraged to visit [facility] as needed for assistance.

## 2016-11-14 ENCOUNTER — Ambulatory Visit: Payer: Self-pay | Attending: Family Medicine | Admitting: Family Medicine

## 2016-11-14 ENCOUNTER — Encounter: Payer: Self-pay | Admitting: Family Medicine

## 2016-11-14 VITALS — BP 129/80 | HR 86 | Temp 98.1°F | Ht <= 58 in | Wt 149.6 lb

## 2016-11-14 DIAGNOSIS — E039 Hypothyroidism, unspecified: Secondary | ICD-10-CM | POA: Insufficient documentation

## 2016-11-14 DIAGNOSIS — E119 Type 2 diabetes mellitus without complications: Secondary | ICD-10-CM

## 2016-11-14 DIAGNOSIS — Z7984 Long term (current) use of oral hypoglycemic drugs: Secondary | ICD-10-CM | POA: Insufficient documentation

## 2016-11-14 LAB — POCT GLYCOSYLATED HEMOGLOBIN (HGB A1C): Hemoglobin A1C: 8.4

## 2016-11-14 LAB — GLUCOSE, POCT (MANUAL RESULT ENTRY): POC GLUCOSE: 110 mg/dL — AB (ref 70–99)

## 2016-11-14 MED ORDER — GLIMEPIRIDE 4 MG PO TABS: 4.0000 mg | ORAL_TABLET | Freq: Every day | ORAL | 5 refills | Status: DC

## 2016-11-14 MED ORDER — METFORMIN HCL 1000 MG PO TABS: 1000.0000 mg | ORAL_TABLET | Freq: Two times a day (BID) | ORAL | 5 refills | Status: DC

## 2016-11-14 MED FILL — GLIMEPIRIDE 4 MG TABLET: 4 | 30 days supply | Qty: 30 | Fill #0

## 2016-11-14 MED FILL — metFORMIN HCL 1000 MG TABS: 1000 | 30 days supply | Qty: 60 | Fill #0

## 2016-11-14 NOTE — Progress Notes (Signed)
Subjective:  Patient ID: Cynthia Wiley, female    DOB: 1966-01-11  Age: 50 y.o. MRN: [id number]  CC: Diabetes   HPI Cynthia Wiley presents for    1. CHRONIC DIABETES  Disease Monitoring  Blood Sugar Ranges:   Fasting: 90-135 ( she checks daily)   Postprandial: 250 ( in evenings)   Polyuria: no   Visual problems: no   Medication Compliance: yes metformin 1000 mg BID most days but not always, and amaryl 2 mg in AM  Medication Side Effects  Hypoglycemia: no   Preventitive Health Care  Eye Exam: done in 12/2015   Foot Exam: done today   Diet pattern: low carb   Exercise:   2. Hypothyroidism: compliant with AM synthroid.  no swelling.  No neck pain or trouble swallowing. No energy changes. No weight changes.   Social History  Substance Use Topics  . Smoking status: Never Smoker  . Smokeless tobacco: Never Used  . Alcohol use No    Outpatient Medications Prior to Visit  Medication Sig Dispense Refill  . atorvastatin (LIPITOR) 40 MG tablet Take 1 tablet (40 mg total) by mouth daily. 30 tablet 3  . Blood Glucose Monitoring Suppl (TRUE METRIX METER) W/DEVICE KIT 1 each by Does not apply route as needed. 1 kit 0  . glimepiride (AMARYL) 2 MG tablet Take 1 tablet (2 mg total) by mouth daily before breakfast. 30 tablet 3  . glucose blood (TRUE METRIX BLOOD GLUCOSE TEST) test strip 1 each by Other route 2 (two) times daily. 100 each 11  . levothyroxine (SYNTHROID, LEVOTHROID) 50 MCG tablet Take 1 tablet (50 mcg total) by mouth daily. 30 tablet 11  . metFORMIN (GLUCOPHAGE) 1000 MG tablet Take 1 tablet (1,000 mg total) by mouth 2 (two) times daily with a meal. 60 tablet 3  . TRUEPLUS LANCETS 28G MISC 1 each by Does not apply route 2 (two) times daily. 100 each 11   No facility-administered medications prior to visit.     ROS Review of Systems  Constitutional: Negative for chills and fever.  Eyes: Negative for visual disturbance.  Respiratory: Negative  for shortness of breath.   Cardiovascular: Negative for chest pain.  Gastrointestinal: Negative for abdominal pain and blood in stool.  Musculoskeletal: Positive for myalgias (R anterior foot x 1-3 times starting 5 days ago at night). Negative for arthralgias and back pain.  Skin: Negative for rash.  Allergic/Immunologic: Negative for immunocompromised state.  Hematological: Negative for adenopathy. Does not bruise/bleed easily.  Psychiatric/Behavioral: Negative for dysphoric mood and suicidal ideas.    Objective:  BP 129/80 (BP Location: Left Arm, Patient Position: Sitting, Cuff Size: Small)   Pulse 86   Temp 98.1 F (36.7 C) (Oral)   Ht '4\' 10"'  (1.473 m)   Wt 149 lb 9.6 oz (67.9 kg)   LMP 01/19/2013   SpO2 98%   BMI 31.27 kg/m   BP/Weight 11/14/2016 07/25/2016 2/70/6237  Systolic BP 628 315 176  Diastolic BP 80 82 77  Wt. (Lbs) 149.6 144.2 145  BMI 31.27 30.14 29.79   Physical Exam  Constitutional: She is oriented to person, place, and time. She appears well-developed and well-nourished. No distress.  HENT:  Head: Normocephalic and atraumatic.  Cardiovascular: Normal rate, regular rhythm, normal heart sounds and intact distal pulses.   Pulmonary/Chest: Effort normal and breath sounds normal.  Musculoskeletal: She exhibits no edema.  Neurological: She is alert and oriented to person, place, and time.  Skin: Skin  is warm and dry. No rash noted.  Psychiatric: She has a normal mood and affect.   Lab Results  Component Value Date   HGBA1C 8.0 07/25/2016   Lab Results  Component Value Date   HGBA1C 8.4 11/14/2016    Lab Results  Component Value Date   TSH 2.17 07/25/2016    CBG 120 Assessment & Plan:   Cynthia Wiley was seen today for diabetes.  Diagnoses and all orders for this visit:  Type 2 diabetes mellitus without complication, without long-term current use of insulin (HCC) -     HgB A1c -     Glucose (CBG) -     glimepiride (AMARYL) 4 MG tablet; Take 1 tablet (4  mg total) by mouth daily before breakfast. -     metFORMIN (GLUCOPHAGE) 1000 MG tablet; Take 1 tablet (1,000 mg total) by mouth 2 (two) times daily with a meal.   No orders of the defined types were placed in this encounter.   Follow-up: Return in about 3 months (around 02/12/2017) for diabetes .   Boykin Nearing MD

## 2016-11-14 NOTE — Patient Instructions (Addendum)
Cynthia Wiley was seen today for diabetes.  Diagnoses and all orders for this visit:  Type 2 diabetes mellitus without complication, without long-term current use of insulin (HCC) -     HgB A1c -     Glucose (CBG) -     glimepiride (AMARYL) 4 MG tablet; Take 1 tablet (4 mg total) by mouth daily before breakfast. -     metFORMIN (GLUCOPHAGE) 1000 MG tablet; Take 1 tablet (1,000 mg total) by mouth 2 (two) times daily with a meal.   Diabetes blood sugar goals  Fasting (in AM before breakfast, 8 hrs of no eating or drinking (except water or unsweetened coffee or tea): 90-110 2 hrs after meals: < 160,   No low sugars: nothing < 70   F/u in 3 month for diabetes  Dr. Adrian Blackwater

## 2016-11-14 NOTE — Progress Notes (Signed)
Pt is here to follow up on diabetes today.

## 2016-11-14 NOTE — Assessment & Plan Note (Signed)
Slight decline with rise in A1c Med: not always compliant P: Take metformin 1000 mg BID Increase amaryl to 4 mg q AM  Reviewed CBG goals

## 2016-11-18 MED FILL — LEVOTHYROXINE 50 MCG TABLET: 50 | 30 days supply | Qty: 30 | Fill #4

## 2016-11-18 MED FILL — ATORVASTATIN 40 MG TABLET: 40 | 30 days supply | Qty: 30 | Fill #1

## 2016-12-19 MED FILL — ATORVASTATIN 40 MG TABLET: 40 | 30 days supply | Qty: 30 | Fill #2

## 2016-12-19 MED FILL — LEVOTHYROXINE 50 MCG TABLET: 50 | 30 days supply | Qty: 30 | Fill #5

## 2016-12-23 MED FILL — ?METFORMIN HCL 1,000 MG TAB: 1000 | 30 days supply | Qty: 60 | Fill #4

## 2017-01-23 MED FILL — metFORMIN HCL 1000 MG TABS: 1000 | 30 days supply | Qty: 60 | Fill #5

## 2017-01-23 MED FILL — ATORVASTATIN 40 MG TABLET: 40 | 30 days supply | Qty: 30 | Fill #3

## 2017-01-25 MED FILL — LEVOTHYROXINE 50 MCG TABLET: 50 | 30 days supply | Qty: 30 | Fill #0

## 2017-02-07 ENCOUNTER — Encounter: Payer: Self-pay | Admitting: Family Medicine

## 2017-02-07 ENCOUNTER — Ambulatory Visit: Payer: Self-pay | Attending: Family Medicine | Admitting: Family Medicine

## 2017-02-07 VITALS — BP 104/65 | HR 84 | Temp 98.1°F | Ht <= 58 in | Wt 142.8 lb

## 2017-02-07 DIAGNOSIS — Z7984 Long term (current) use of oral hypoglycemic drugs: Secondary | ICD-10-CM | POA: Insufficient documentation

## 2017-02-07 DIAGNOSIS — E119 Type 2 diabetes mellitus without complications: Secondary | ICD-10-CM | POA: Insufficient documentation

## 2017-02-07 DIAGNOSIS — E039 Hypothyroidism, unspecified: Secondary | ICD-10-CM | POA: Insufficient documentation

## 2017-02-07 LAB — POCT GLYCOSYLATED HEMOGLOBIN (HGB A1C): HEMOGLOBIN A1C: 8.9

## 2017-02-07 LAB — GLUCOSE, POCT (MANUAL RESULT ENTRY): POC GLUCOSE: 124 mg/dL — AB (ref 70–99)

## 2017-02-07 MED ORDER — GLIPIZIDE ER 5 MG PO TB24: 5.0000 mg | ORAL_TABLET | Freq: Every day | ORAL | 2 refills | Status: DC

## 2017-02-07 MED FILL — glipiZIDE XL 5 MG TB24: 5 | 30 days supply | Qty: 30 | Fill #0

## 2017-02-07 NOTE — Progress Notes (Signed)
Subjective:  Patient ID: Cynthia Wiley, female    DOB: 10-02-1966  Age: 51 y.o. MRN: [id number] Spanish interpreter Burman Nieves ID #  [id number] CC: Diabetes   HPI Cynthia Wiley presents for    1. CHRONIC DIABETES  Disease Monitoring  Blood Sugar Ranges:   Fasting: 90-135 ( she checks daily)   Postprandial: 14-152   Polyuria: no   Visual problems: no   Medication Compliance: she developed stomach upset with Amaryl 4 mg. She stopped after 15 days. she is taking metformin 1000 mg BID Medication Side Effects  Hypoglycemia: no   Preventitive Health Care  Eye Exam: done in 12/2015   Foot Exam: done today   Diet pattern: low carb   Exercise:   2. Hypothyroidism: compliant with AM synthroid.  no swelling.  No neck pain or trouble swallowing. No energy changes. No weight changes.   Social History  Substance Use Topics  . Smoking status: Never Smoker  . Smokeless tobacco: Never Used  . Alcohol use No    Outpatient Medications Prior to Visit  Medication Sig Dispense Refill  . atorvastatin (LIPITOR) 40 MG tablet Take 1 tablet (40 mg total) by mouth daily. 30 tablet 3  . Blood Glucose Monitoring Suppl (TRUE METRIX METER) W/DEVICE KIT 1 each by Does not apply route as needed. 1 kit 0  . glimepiride (AMARYL) 4 MG tablet Take 1 tablet (4 mg total) by mouth daily before breakfast. 30 tablet 5  . glucose blood (TRUE METRIX BLOOD GLUCOSE TEST) test strip 1 each by Other route 2 (two) times daily. 100 each 11  . levothyroxine (SYNTHROID, LEVOTHROID) 50 MCG tablet Take 1 tablet (50 mcg total) by mouth daily. 30 tablet 11  . metFORMIN (GLUCOPHAGE) 1000 MG tablet Take 1 tablet (1,000 mg total) by mouth 2 (two) times daily with a meal. 60 tablet 5  . TRUEPLUS LANCETS 28G MISC 1 each by Does not apply route 2 (two) times daily. 100 each 11   No facility-administered medications prior to visit.     ROS Review of Systems  Constitutional: Negative for chills and fever.  Eyes:  Negative for visual disturbance.  Respiratory: Negative for shortness of breath.   Cardiovascular: Negative for chest pain.  Gastrointestinal: Negative for abdominal pain and blood in stool.  Musculoskeletal: Positive for myalgias (R anterior foot x 1-3 times starting 5 days ago at night). Negative for arthralgias and back pain.  Skin: Negative for rash.  Allergic/Immunologic: Negative for immunocompromised state.  Hematological: Negative for adenopathy. Does not bruise/bleed easily.  Psychiatric/Behavioral: Negative for dysphoric mood and suicidal ideas.    Objective:  BP 104/65 (BP Location: Left Arm, Patient Position: Sitting, Cuff Size: Small)   Pulse 84   Temp 98.1 F (36.7 C) (Oral)   Ht '4\' 10"'  (1.473 m)   Wt 142 lb 12.8 oz (64.8 kg)   LMP 01/19/2013   SpO2 100%   BMI 29.85 kg/m   BP/Weight 02/07/2017 11/14/2016 4/37/3578  Systolic BP 978 478 412  Diastolic BP 65 80 82  Wt. (Lbs) 142.8 149.6 144.2  BMI 29.85 31.27 30.14   Physical Exam  Constitutional: She is oriented to person, place, and time. She appears well-developed and well-nourished. No distress.  HENT:  Head: Normocephalic and atraumatic.  Cardiovascular: Normal rate, regular rhythm, normal heart sounds and intact distal pulses.   Pulmonary/Chest: Effort normal and breath sounds normal.  Musculoskeletal: She exhibits no edema.  Neurological: She is alert and oriented to person,  place, and time.  Skin: Skin is warm and dry. No rash noted.  Psychiatric: She has a normal mood and affect.   Lab Results  Component Value Date   HGBA1C 8.9 02/07/2017   Lab Results  Component Value Date   TSH 2.13 02/07/2017    CBG 124 Assessment & Plan:   Cynthia Wiley was seen today for diabetes.  Diagnoses and all orders for this visit:  Type 2 diabetes mellitus without complication, without long-term current use of insulin (HCC) -     POCT glucose (manual entry) -     POCT glycosylated hemoglobin (Hb A1C) -     glipiZIDE  (GLUCOTROL XL) 5 MG 24 hr tablet; Take 1 tablet (5 mg total) by mouth daily with breakfast. -     BASIC METABOLIC PANEL WITH GFR -     Ambulatory referral to Ophthalmology  Hypothyroidism, unspecified type -     TSH -     Lipid panel   No orders of the defined types were placed in this encounter.   Follow-up: Return in about 4 weeks (around 03/07/2017) for CBG  check .   Boykin Nearing MD

## 2017-02-07 NOTE — Patient Instructions (Addendum)
Liseth was seen today for diabetes.  Diagnoses and all orders for this visit:  Type 2 diabetes mellitus without complication, without long-term current use of insulin (HCC) -     POCT glucose (manual entry) -     POCT glycosylated hemoglobin (Hb A1C) -     glipiZIDE (GLUCOTROL XL) 5 MG 24 hr tablet; Take 1 tablet (5 mg total) by mouth daily with breakfast. -     BASIC METABOLIC PANEL WITH GFR -     Ambulatory referral to Ophthalmology  Hypothyroidism, unspecified type -     TSH -     Lipid panel  F/u in 3-4 weeks with Erline Levine for CBG check, possible increase on glipizide   F/u in 3 months for diabetes with me   Dr. Adrian Blackwater

## 2017-02-08 LAB — LIPID PANEL
CHOL/HDL RATIO: 2.7 ratio (ref <5.0)
CHOLESTEROL: 98 mg/dL (ref <200)
HDL: 36 mg/dL — ABNORMAL LOW (ref >50)
LDL CALC: 45 mg/dL (ref <100)
Triglycerides: 86 mg/dL (ref <150)
VLDL: 17 mg/dL (ref <30)

## 2017-02-08 LAB — BASIC METABOLIC PANEL WITH GFR
BUN: 8 mg/dL (ref 7–25)
CHLORIDE: 101 mmol/L (ref 98–110)
CO2: 24 mmol/L (ref 20–31)
CREATININE: 0.61 mg/dL (ref 0.50–1.05)
Calcium: 9.6 mg/dL (ref 8.6–10.4)
GFR, Est African American: >89 mL/min (ref >=60)
GFR, Est Non African American: >89 mL/min (ref >=60)
Glucose, Bld: 113 mg/dL — ABNORMAL HIGH (ref 65–99)
POTASSIUM: 4.9 mmol/L (ref 3.5–5.3)
Sodium: 138 mmol/L (ref 135–146)

## 2017-02-08 LAB — TSH: TSH: 2.13 m[IU]/L

## 2017-02-09 NOTE — Assessment & Plan Note (Signed)
TSH at goal. Continue current regimen.  

## 2017-02-09 NOTE — Assessment & Plan Note (Addendum)
Uncontrolled She did not tolerate Amaryl CBG normal Add glipizide 5 mg XL

## 2017-02-14 ENCOUNTER — Encounter: Payer: Self-pay | Admitting: Family Medicine

## 2017-02-14 MED ORDER — ASPIRIN 81 MG PO TABS: 81.0000 mg | ORAL_TABLET | Freq: Every day | ORAL | 11 refills | Status: DC

## 2017-02-14 NOTE — Addendum Note (Signed)
Addended by: Boykin Nearing on: 02/14/2017 12:30 PM   Modules accepted: Orders

## 2017-02-15 ENCOUNTER — Telehealth: Payer: Self-pay

## 2017-02-15 NOTE — Telephone Encounter (Signed)
Pt was called and informed of lab results. Laural Benes [phone]

## 2017-02-23 ENCOUNTER — Other Ambulatory Visit: Payer: Self-pay | Admitting: Family Medicine

## 2017-02-23 DIAGNOSIS — E119 Type 2 diabetes mellitus without complications: Secondary | ICD-10-CM

## 2017-02-23 MED FILL — ATORVASTATIN 40 MG TABLET: 40 | 30 days supply | Qty: 30 | Fill #0

## 2017-02-24 MED FILL — LEVOTHYROXINE 50 MCG TABLET: 50 | 30 days supply | Qty: 30 | Fill #1

## 2017-02-28 ENCOUNTER — Ambulatory Visit: Payer: Self-pay | Attending: Family Medicine | Admitting: Pharmacist

## 2017-02-28 DIAGNOSIS — E119 Type 2 diabetes mellitus without complications: Secondary | ICD-10-CM | POA: Insufficient documentation

## 2017-02-28 DIAGNOSIS — Z79899 Other long term (current) drug therapy: Secondary | ICD-10-CM

## 2017-02-28 DIAGNOSIS — Z7984 Long term (current) use of oral hypoglycemic drugs: Secondary | ICD-10-CM | POA: Insufficient documentation

## 2017-02-28 NOTE — Progress Notes (Signed)
    S:     Chief Complaint  Patient presents with  . Medication Management    Patient arrives in good spirits.  Presents for diabetes evaluation, education, and management at the request of Dr. Adrian Blackwater. Patient was referred on 02/07/17.  Patient was last seen by Primary Care Provider on 02/07/17. Spanish interpreter Claris Pong [phone] was used for the entirety of the visit.  Patient reports adherence with medications.  Current diabetes medications include: glipizide 5 mg daily, metformin 1000 mg BID. She was on glimepiride in the past but did not tolerate it.  Patient denies hypoglycemic events. She had a reading of 78 one morning but that was after a night that she didn't eat as much.  Patient reported dietary habits: trying to watch her carb intake.   Patient denies nocturia.  Patient denies neuropathy. Patient denies visual changes. Patient reports self foot exams.    O:  Physical Exam   ROS   Lab Results  Component Value Date   HGBA1C 8.9 02/07/2017   There were no vitals filed for this visit.  Home fasting CBG: 78-110s 2 hour post-prandial/random CBG: 100s-150s   A/P: Diabetes longstandingcurrently uncontrolled based on A1c of 8.9 but under improved control based on home CBGs. Patient denies hypoglycemic events and is able to verbalize appropriate hypoglycemia management plan. Patient reports adherence with medication.   Continue current medications as prescribed. She is tolerating them well and I expect her A1c to be much improved at the next reading if she continues to have home CBGs like this. Congratulated her on her progress.   Next A1C anticipated June 2018.     Written patient instructions provided.  Total time in face to face counseling 20 minutes.   Follow up in Pharmacist Clinic Visit PRN, next visit with Dr. Adrian Blackwater.

## 2017-02-28 NOTE — Patient Instructions (Signed)
Follow up with Dr. Adrian Blackwater in 2 months  Keep up the great work!!!   Hipoglucemia (Hypoglycemia) La hipoglucemia se produce cuando el nivel de azcar (glucosa) en la sangre es demasiado bajo. Los sntomas de la glucemia baja pueden incluir los siguientes:  Sentir que tiene lo siguiente:  Apetito.  Preocupacin o nervios (ansioso).  Sudoracin y piel hmeda.  Confusin.  Mareos.  Somnolencia.  Nuseas.  Tener lo siguiente:  Latidos cardacos acelerados.  Dolor de [place].  Cambios en la visin.  Una crisis de movimientos que no puede controlar (convulsiones).  Pesadillas.  Hormigueo y falta de sensibilidad (adormecimiento) alrededor de la boca, los labios o la [place].  Dificultades para hacer lo siguiente:  Hablar.  Prestar atencin (concentrarse).  Moverse (coordinacin).  Dormir.  Temblores.  Desmayos.  Molestarse con facilidad (irritabilidad). Las personas que tienen diabetes y las que no tienen la enfermedad pueden tener la glucemia baja. El nivel bajo de glucemia en la sangre puede ocurrir rpidamente y ser Engineer, maintenance (IT). Tratamiento de la glucemia baja  Generalmente, el tratamiento de la glucemia baja consiste en ingerir de inmediato un alimento o una bebida que contengan azcar. Si puede pensar con claridad y tragar de manera segura, siga la regla 15/15, que consiste en lo siguiente:  Consumir 15gramos de un hidrato de carbono de accin rpida. Algunos hidratos de carbono de accin rpida son los siguientes:  1pomo de glucosa en gel.  3comprimidos de azcar (comprimidos de glucosa).  6 a 8unidades de caramelos duros.  4onzas (174m) de jugo de frutas.  4onzas (1229m de gaseosa comn (no diettica).  Contrlese la glucemia 1533mtos despus de ingerir el hidrato de carbono.  Si el nivel de glucosa en la sangre todava es igual o menor que 38m14m (3,9mmo12m), ingiera nuevamente 15gramos de un hidrato de carbono.  Si el nivel  de glucosa en la sangre no supera los 38mg/55m3,9mmol/44mdespus de 3intentos, solicite ayuda de inmediato.  Ingiera una comida o una colacin en el transcurso de 1hora despus de que la glucemia se haya normalizado. Tratamiento de la glucemia muy baja  Si el nivel de glucosa en la sangre es igual o menor que 54mg/dl55mmol/l)82mignifica que est muy bajo (hipoglucemia grave). Esto es una emergEngineer, maintenance (IT)re hasta que los sntomas desaparezcan. Solicite atencin mdica de inmediato. Comunquese con el servicio de emergencias de su localidad (911 en los [place]). No conduzca por sus propios medios hasta el Principal Financialivel de glucosa en la sangre muy bajo y no puede ingerir ningn alimento ni bebida, tal vez deba aplicarse una inyeccin de glucagn. Un familiar o un amigo deben aprender a controlarle la glucemia y a aplicarle una inyeccin de glucagn. Pregntele al mdico si debe tener un kit de inyecciones de glucagn en su casa. CUIDADOS EN EL HOGAR Instrucciones generales  Evite cualquier dieta que le impida ingerir la cantidad suficiente de comida. Hable con el mdico antes de comenzar una dieta nueva.  Tome los medicamentos de venta libre y los recetados solamente como se lo haya indicado el mdico.  Limite el consumo de alcohol a no ms de 1medida p17mda si es mujer y no est embarazadaSan Marinodas p23mda si es hombre. Una medida equivale a 12onzas de cerveza, 5onzas de vino o 1onzas de bebidas alcohlicas de alta graduacin.  Concurra a todas las visitas de control como se lo haya indicado el mdico. Esto es importante. Si usted tiene diabetes:  Asegrese de conocer losRyland Group  sntomas de la hipoglucemia.  Siempre tenga a mano una fuente de azcar, como por ejemplo:  Azcar.  Comprimidos de azcar.  Gel de glucosa.  Jugo de frutas.  Gaseosa comn (gaseosa que no sea diettica).  Leche.  Caramelos duros.  Miel.  Tome los medicamentos segn las  indicaciones.  Siga el plan de ejercicios y de alimentacin.  Coma a horario. No omita comidas.  Siga el plan para los das de enfermedad cuando no pueda comer o beber normalmente. Arme este plan de antemano con el mdico.  Contrlese la glucemia con la frecuencia que le haya indicado el mdico. Contrlesela siempre antes y despus de hacer actividad fsica.  Comparta su plan de atencin de la diabetes con estas personas:  Compaeros de trabajo o de la escuela.  Aquellas con las que [place].  Hgase anlisis de orina para Product manager presencia de cetonas:  Cuando est enfermo.  Como se lo haya indicado el mdico.  Lleve consigo una tarjeta, o use un brazalete o una medalla que indiquen que es diabtico. Si tiene un nivel bajo de glucosa en la sangre debido a otras causas:  Contrlese la glucemia con la frecuencia que le haya indicado el mdico.  Siga las indicaciones del mdico respecto de lo que no puede comer o beber. SOLICITE AYUDA SI:  Tiene problemas para mantener el nivel de glucosa en la sangre dentro del rango indicado.  Tiene la glucemia baja con frecuencia. SOLICITE AYUDA DE INMEDIATO SI:  Contina teniendo sntomas despus de haber comido o ingerido algo con azcar.  La glucemia es igual o inferior a 74m/dl (368ml/dl).  Tiene una crisis de movimientos que no puIT consultant Se desmaya. Estos sntomas pueden inSales executiveNo espere hasta que los sntomas desaparezcan. Solicite atencin mdica de inmediato. Comunquese con el servicio de emergencias de su localidad (911 en los [place]). No conduzca por sus propios medios haPrincipal FinancialEsta informacin no tiene coMarine scientistl consejo del mdico. Asegrese de hacerle al mdico cualquier pregunta que tenga. Document Released: 12/24/2010 Document Revised: 03/14/2016 Document Reviewed: 12/25/2015 Elsevier Interactive Patient Education  2017 [employer]

## 2017-03-06 MED FILL — glipiZIDE XL 5 MG TB24: 5 | 30 days supply | Qty: 30 | Fill #1

## 2017-03-06 MED FILL — metFORMIN HCL 1000 MG TABS: 1000 | 30 days supply | Qty: 60 | Fill #1

## 2017-03-24 ENCOUNTER — Ambulatory Visit: Payer: Self-pay | Attending: Family Medicine | Admitting: Family Medicine

## 2017-03-24 ENCOUNTER — Encounter: Payer: Self-pay | Admitting: Family Medicine

## 2017-03-24 VITALS — BP 126/64 | HR 80 | Temp 98.3°F | Ht <= 58 in | Wt 141.2 lb

## 2017-03-24 DIAGNOSIS — M549 Dorsalgia, unspecified: Secondary | ICD-10-CM | POA: Insufficient documentation

## 2017-03-24 DIAGNOSIS — Z7982 Long term (current) use of aspirin: Secondary | ICD-10-CM | POA: Insufficient documentation

## 2017-03-24 DIAGNOSIS — Z7984 Long term (current) use of oral hypoglycemic drugs: Secondary | ICD-10-CM | POA: Insufficient documentation

## 2017-03-24 DIAGNOSIS — Z0000 Encounter for general adult medical examination without abnormal findings: Secondary | ICD-10-CM

## 2017-03-24 DIAGNOSIS — R109 Unspecified abdominal pain: Secondary | ICD-10-CM

## 2017-03-24 DIAGNOSIS — E119 Type 2 diabetes mellitus without complications: Secondary | ICD-10-CM

## 2017-03-24 DIAGNOSIS — E039 Hypothyroidism, unspecified: Secondary | ICD-10-CM | POA: Insufficient documentation

## 2017-03-24 LAB — POCT URINALYSIS DIPSTICK
Bilirubin, UA: NEGATIVE
Glucose, UA: NEGATIVE
Ketones, UA: NEGATIVE
Leukocytes, UA: NEGATIVE
Nitrite, UA: NEGATIVE
PROTEIN UA: NEGATIVE
SPEC GRAV UA: 1.015 (ref 1.010–1.025)
UROBILINOGEN UA: 1 U/dL
pH, UA: 8.5 — AB (ref 5.0–8.0)

## 2017-03-24 LAB — GLUCOSE, POCT (MANUAL RESULT ENTRY): POC Glucose: 115 mg/dl — AB (ref 70–99)

## 2017-03-24 MED ORDER — SITAGLIPTIN PHOSPHATE 50 MG PO TABS: 50.0000 mg | ORAL_TABLET | Freq: Every day | ORAL | 2 refills | Status: DC

## 2017-03-24 NOTE — Progress Notes (Signed)
Subjective:  Patient ID: Cynthia Wiley, female    DOB: [birth year]  Age: 51 y.o. MRN: [id number]  CC: Abdominal Pain and Back Pain   HPI [facility] [name] has diabetes and hypothyroidism she presents for abdominal and back pain   [employer] Spanish interpreter Shanon Brow ID # [id number]    6. Abdominal pain: in Left upper abdomen traveling to L mid back. Started two weeks ago. Pain was constant last week. This week pain occurs mostly at night. Pain stated as sharp. No exacerbating symptoms. No associated nausea, emesis, hematuria or dysuria, no fever or chills. She denies consitpation. She admits to slight diarrhea. She has no pain currently. She had no pain last night. Pain free for past 2 days. She reports that she stopped taking glipizide 5 mg XL prescribed at the last visit as it induced abdominal pain.   Social History  Substance Use Topics  . Smoking status: Never Smoker  . Smokeless tobacco: Never Used  . Alcohol use No   Outpatient Medications Prior to Visit  Medication Sig Dispense Refill  . aspirin 81 MG tablet Take 1 tablet (81 mg total) by mouth daily. 30 tablet 11  . atorvastatin (LIPITOR) 40 MG tablet TAKE 1 TABLET BY MOUTH DAILY. 30 tablet 3  . Blood Glucose Monitoring Suppl (TRUE METRIX METER) W/DEVICE KIT 1 each by Does not apply route as needed. 1 kit 0  . glipiZIDE (GLUCOTROL XL) 5 MG 24 hr tablet Take 1 tablet (5 mg total) by mouth daily with breakfast. 30 tablet 2  . glucose blood (TRUE METRIX BLOOD GLUCOSE TEST) test strip 1 each by Other route 2 (two) times daily. 100 each 11  . levothyroxine (SYNTHROID, LEVOTHROID) 50 MCG tablet Take 1 tablet (50 mcg total) by mouth daily. 30 tablet 11  . metFORMIN (GLUCOPHAGE) 1000 MG tablet Take 1 tablet (1,000 mg total) by mouth 2 (two) times daily with a meal. 60 tablet 5  . TRUEPLUS LANCETS 28G MISC 1 each by Does not apply route 2 (two) times daily. 100 each 11   No facility-administered medications prior to  visit.     ROS Review of Systems  Constitutional: Negative for chills and fever.  Eyes: Negative for visual disturbance.  Respiratory: Negative for shortness of breath.   Cardiovascular: Negative for chest pain.  Gastrointestinal: Positive for abdominal pain. Negative for blood in stool.  Musculoskeletal: Positive for back pain. Negative for arthralgias.  Skin: Negative for rash.  Allergic/Immunologic: Negative for immunocompromised state.  Hematological: Negative for adenopathy. Does not bruise/bleed easily.  Psychiatric/Behavioral: Negative for dysphoric mood and suicidal ideas.    Objective:  BP 126/64   Pulse 80   Temp 98.3 F (36.8 C) (Oral)   Ht '4\' 10"'  (1.473 m)   Wt 141 lb 3.2 oz (64 kg)   LMP 01/19/2013   SpO2 99%   BMI 29.51 kg/m   BP/Weight 03/24/2017 02/07/2017 75/91/6384  Systolic BP 665 993 570  Diastolic BP 64 65 80  Wt. (Lbs) 141.2 142.8 149.6  BMI 29.51 29.85 31.27    Physical Exam  Constitutional: She is oriented to person, place, and time. She appears well-developed and well-nourished. No distress.  HENT:  Head: Normocephalic and atraumatic.  Cardiovascular: Normal rate, regular rhythm, normal heart sounds and intact distal pulses.   Pulmonary/Chest: Effort normal and breath sounds normal.  Musculoskeletal: She exhibits no edema.  Neurological: She is alert and oriented to person, place, and time.  Skin: Skin is warm  and dry. No rash noted.  Psychiatric: She has a normal mood and affect.    Lab Results  Component Value Date   HGBA1C 8.9 02/07/2017   CBG 115 UA: small blood, otherwise negative   Assessment & Plan:  Lakina was seen today for abdominal pain and back pain.  Diagnoses and all orders for this visit:  Type 2 diabetes mellitus without complication, without long-term current use of insulin (HCC) -     POCT glucose (manual entry) -     POCT urinalysis dipstick -     Microalbumin / creatinine urine ratio -     sitaGLIPtin (JANUVIA) 50  MG tablet; Take 1 tablet (50 mg total) by mouth daily.  Healthcare maintenance -     Ambulatory referral to Gastroenterology   There are no diagnoses linked to this encounter.  No orders of the defined types were placed in this encounter.   Follow-up: Return in about 6 weeks (around 05/05/2017) for diabetes .   Boykin Nearing MD

## 2017-03-24 NOTE — Patient Instructions (Addendum)
Cynthia Wiley was seen today for abdominal pain and back pain.  Diagnoses and all orders for this visit:  Type 2 diabetes mellitus without complication, without long-term current use of insulin (HCC) -     POCT glucose (manual entry) -     POCT urinalysis dipstick -     Microalbumin / creatinine urine ratio -     sitaGLIPtin (JANUVIA) 50 MG tablet; Take 1 tablet (50 mg total) by mouth daily.  Healthcare maintenance -     Ambulatory referral to Gastroenterology   Removed glipizide from med list Please replace with januvia to help get A1c back down to < 7, las check was 8.9 last month   Ibuprofen 600 mg ( 3 of the 200 mg tablets) twice daily for 3 days  If pain returns especially with nausea, vomiting or pain with urination please return  f/u in 6 weeks for diabetes/A1c check   Dr. Adrian Blackwater

## 2017-03-24 NOTE — Progress Notes (Signed)
Pt is having pain in left side of abdomen.

## 2017-03-25 LAB — MICROALBUMIN / CREATININE URINE RATIO
CREATININE, UR: 64.6 mg/dL
MICROALB/CREAT RATIO: 6.7 mg/g{creat} (ref 0.0–30.0)
Microalbumin, Urine: 4.3 ug/mL

## 2017-03-26 DIAGNOSIS — R109 Unspecified abdominal pain: Secondary | ICD-10-CM | POA: Insufficient documentation

## 2017-03-26 NOTE — Assessment & Plan Note (Signed)
A: diabetes uncontrolled Intolerant of sulfonylureas   P: Add januvia 50 mg daily

## 2017-03-26 NOTE — Assessment & Plan Note (Signed)
Abdominal pain with trace Hgb on UA Resolved pain  Suspect kidney stone vs glipizide induced abdominal pain

## 2017-03-27 ENCOUNTER — Other Ambulatory Visit: Payer: Self-pay | Admitting: Family Medicine

## 2017-03-27 DIAGNOSIS — E119 Type 2 diabetes mellitus without complications: Secondary | ICD-10-CM

## 2017-03-27 MED ORDER — SAXAGLIPTIN HCL 2.5 MG PO TABS: 2.5000 mg | ORAL_TABLET | Freq: Every day | ORAL | 2 refills | Status: DC

## 2017-03-27 MED FILL — metFORMIN HCL 1000 MG TABS: 1000 | 30 days supply | Qty: 60 | Fill #2

## 2017-03-27 MED FILL — LEVOTHYROXINE 50 MCG TABLET: 50 | 30 days supply | Qty: 30 | Fill #2

## 2017-03-27 MED FILL — ATORVASTATIN 40 MG TABLET: 40 | 30 days supply | Qty: 30 | Fill #1

## 2017-03-29 ENCOUNTER — Telehealth: Payer: Self-pay

## 2017-03-29 NOTE — Telephone Encounter (Signed)
Pt was called and informed of lab results. 

## 2017-04-19 ENCOUNTER — Encounter: Payer: Self-pay | Admitting: Family Medicine

## 2017-04-26 MED FILL — LEVOTHYROXINE 50 MCG TABLET: 50 | 30 days supply | Qty: 30 | Fill #3

## 2017-05-02 ENCOUNTER — Encounter: Payer: Self-pay | Admitting: Family Medicine

## 2017-05-02 MED FILL — ?ATORVASTATIN 40 MG TABLET: 40 MG | 30 days supply | Qty: 30 | Fill #2

## 2017-05-02 MED FILL — metFORMIN HCL 1000 MG TABS: 1000 | 30 days supply | Qty: 60 | Fill #3

## 2017-05-09 ENCOUNTER — Ambulatory Visit: Payer: Self-pay | Attending: Family Medicine | Admitting: Family Medicine

## 2017-05-09 ENCOUNTER — Encounter: Payer: Self-pay | Admitting: Family Medicine

## 2017-05-09 VITALS — BP 111/63 | HR 85 | Temp 98.1°F | Wt 138.8 lb

## 2017-05-09 DIAGNOSIS — E119 Type 2 diabetes mellitus without complications: Secondary | ICD-10-CM | POA: Insufficient documentation

## 2017-05-09 DIAGNOSIS — Z7984 Long term (current) use of oral hypoglycemic drugs: Secondary | ICD-10-CM | POA: Insufficient documentation

## 2017-05-09 LAB — POCT GLYCOSYLATED HEMOGLOBIN (HGB A1C): Hemoglobin A1C: 8

## 2017-05-09 LAB — GLUCOSE, POCT (MANUAL RESULT ENTRY): POC Glucose: 115 mg/dl — AB (ref 70–99)

## 2017-05-09 MED ORDER — METFORMIN HCL 1000 MG PO TABS: ORAL_TABLET | ORAL | 5 refills | Status: DC

## 2017-05-09 NOTE — Assessment & Plan Note (Signed)
A: improving  Med: tolerating metformin P: Increase metformin to 1500 mg in AM and 1000 mg in PM Add Asprin 81 mg after supper

## 2017-05-09 NOTE — Patient Instructions (Signed)
Cynthia Wiley was seen today for diabetes.  Diagnoses and all orders for this visit:  Type 2 diabetes mellitus without complication, without long-term current use of insulin (HCC) -     POCT glucose (manual entry) -     POCT glycosylated hemoglobin (Hb A1C) -     metFORMIN (GLUCOPHAGE) 1000 MG tablet; Take by mouth 1500 (1 and a half) in the morning and 1000 in the evening  Diabetes blood sugar goals  Fasting (in AM before breakfast, 8 hrs of no eating or drinking (except water or unsweetened coffee or tea): 90-130 2 hrs after meals: < 160,   No low sugars: nothing < 70   F.u in 3 months for diabetes  Dr. Adrian Blackwater

## 2017-05-09 NOTE — Progress Notes (Signed)
Subjective:  Patient ID: Cynthia Wiley, female    DOB: [birth year]  Age: 51 y.o. MRN: [id number] Spanish interpreter [employer] video used  CC: Diabetes   HPI Cynthia Wiley presents for    1. CHRONIC DIABETES  Disease Monitoring  Blood Sugar Ranges:   Fasting: 90-131  Postprandial: 175 after supper  Polyuria: no   Visual problems: no   Medication Compliance:  She is taking metformin 1000 mg BID. Amaryl, glipizide and onglyza all caused GI upset so she stopped them.  Medication Side Effects  Hypoglycemia: no   Preventitive Health Care  Eye Exam: done in 12/2015   Foot Exam: done today   Diet pattern: low carb   Exercise:    2. Healthcare maintenance: she is uninsured. No orange card. Declines opthalmology and GI referrals for screening colonoscopy at this time due to cost.   Social History  Substance Use Topics  . Smoking status: Never Smoker  . Smokeless tobacco: Never Used  . Alcohol use No    Outpatient Medications Prior to Visit  Medication Sig Dispense Refill  . aspirin 81 MG tablet Take 1 tablet (81 mg total) by mouth daily. 30 tablet 11  . atorvastatin (LIPITOR) 40 MG tablet TAKE 1 TABLET BY MOUTH DAILY. 30 tablet 3  . Blood Glucose Monitoring Suppl (TRUE METRIX METER) W/DEVICE KIT 1 each by Does not apply route as needed. 1 kit 0  . glucose blood (TRUE METRIX BLOOD GLUCOSE TEST) test strip 1 each by Other route 2 (two) times daily. 100 each 11  . levothyroxine (SYNTHROID, LEVOTHROID) 50 MCG tablet Take 1 tablet (50 mcg total) by mouth daily. 30 tablet 11  . metFORMIN (GLUCOPHAGE) 1000 MG tablet Take 1 tablet (1,000 mg total) by mouth 2 (two) times daily with a meal. 60 tablet 5  . saxagliptin HCl (ONGLYZA) 2.5 MG TABS tablet Take 1 tablet (2.5 mg total) by mouth daily. 30 tablet 2  . TRUEPLUS LANCETS 28G MISC 1 each by Does not apply route 2 (two) times daily. 100 each 11   No facility-administered medications prior to visit.      ROS Review of Systems  Constitutional: Negative for chills and fever.  Eyes: Negative for visual disturbance.  Respiratory: Negative for shortness of breath.   Cardiovascular: Negative for chest pain.  Gastrointestinal: Negative for abdominal pain and blood in stool.  Musculoskeletal: Negative for arthralgias and back pain.  Skin: Negative for rash.  Allergic/Immunologic: Negative for immunocompromised state.  Hematological: Negative for adenopathy. Does not bruise/bleed easily.  Psychiatric/Behavioral: Negative for dysphoric mood and suicidal ideas.    Objective:  BP 111/63   Pulse 85   Temp 98.1 F (36.7 C) (Oral)   Wt 138 lb 12.8 oz (63 kg)   LMP 01/19/2013   SpO2 100%   BMI 29.01 kg/m   BP/Weight 05/09/2017 06/01/3150 06/09/1606  Systolic BP 371 062 694  Diastolic BP 63 64 65  Wt. (Lbs) 138.8 141.2 142.8  BMI 29.01 29.51 29.85   Physical Exam  Constitutional: She is oriented to person, place, and time. She appears well-developed and well-nourished. No distress.  HENT:  Head: Normocephalic and atraumatic.  Cardiovascular: Normal rate, regular rhythm, normal heart sounds and intact distal pulses.   Pulmonary/Chest: Effort normal and breath sounds normal.  Musculoskeletal: She exhibits no edema.  Neurological: She is alert and oriented to person, place, and time.  Skin: Skin is warm and dry. No rash noted.  Psychiatric: She has a normal  mood and affect.   Lab Results  Component Value Date   HGBA1C 8.9 02/07/2017   Lab Results  Component Value Date   HGBA1C 8.0 05/09/2017   Lab Results  Component Value Date   TSH 2.13 02/07/2017    CBG 115 Assessment & Plan:   Cynthia Wiley was seen today for diabetes.  Diagnoses and all orders for this visit:  Type 2 diabetes mellitus without complication, without long-term current use of insulin (HCC) -     POCT glucose (manual entry) -     POCT glycosylated hemoglobin (Hb A1C) -     metFORMIN (GLUCOPHAGE) 1000 MG tablet;  Take by mouth 1500 (1 and a half) in the morning and 1000 in the evening   No orders of the defined types were placed in this encounter.   Follow-up: Return in about 3 months (around 08/09/2017) for diabetes.   Cynthia Funches MD    

## 2017-05-11 ENCOUNTER — Ambulatory Visit: Payer: Self-pay | Admitting: Family Medicine

## 2017-05-25 MED FILL — metFORMIN HCL 1000 MG TABS: 1000 | 30 days supply | Qty: 60 | Fill #4

## 2017-05-25 MED FILL — LEVOTHYROXINE 50 MCG TABLET: 50 | 30 days supply | Qty: 30 | Fill #4

## 2017-05-30 MED FILL — ATORVASTATIN 40 MG TABLET: 40 | 30 days supply | Qty: 30 | Fill #3

## 2017-06-01 ENCOUNTER — Other Ambulatory Visit: Payer: Self-pay | Admitting: Family Medicine

## 2017-06-01 DIAGNOSIS — E039 Hypothyroidism, unspecified: Secondary | ICD-10-CM

## 2017-06-01 DIAGNOSIS — E119 Type 2 diabetes mellitus without complications: Secondary | ICD-10-CM

## 2017-06-01 DIAGNOSIS — E118 Type 2 diabetes mellitus with unspecified complications: Secondary | ICD-10-CM

## 2017-06-01 MED FILL — LEVOTHYROXINE 50 MCG TABLET: 50 | 30 days supply | Qty: 30 | Fill #0

## 2017-06-01 MED FILL — TRUE METRIX TEST STRIP: 50 days supply | Qty: 100 | Fill #1

## 2017-06-01 MED FILL — ?METFORMIN HCL 1,000 MG TAB: 1000 | 30 days supply | Qty: 60 | Fill #0

## 2017-06-06 ENCOUNTER — Ambulatory Visit: Payer: Self-pay | Attending: Family Medicine

## 2017-07-10 MED FILL — ?METFORMIN HCL 1,000 MG TAB: 1000 | 30 days supply | Qty: 60 | Fill #1

## 2017-07-10 MED FILL — LEVOTHYROXINE 50 MCG TABLET: 50 | 30 days supply | Qty: 30 | Fill #1

## 2017-07-10 MED FILL — ATORVASTATIN 40 MG TABLET: 40 | 30 days supply | Qty: 30 | Fill #0

## 2017-08-07 ENCOUNTER — Other Ambulatory Visit: Payer: Self-pay | Admitting: Family Medicine

## 2017-08-07 ENCOUNTER — Encounter: Payer: Self-pay | Admitting: Internal Medicine

## 2017-08-07 DIAGNOSIS — E118 Type 2 diabetes mellitus with unspecified complications: Secondary | ICD-10-CM

## 2017-08-07 DIAGNOSIS — E039 Hypothyroidism, unspecified: Secondary | ICD-10-CM

## 2017-08-07 DIAGNOSIS — E785 Hyperlipidemia, unspecified: Secondary | ICD-10-CM | POA: Insufficient documentation

## 2017-08-08 ENCOUNTER — Ambulatory Visit: Payer: Self-pay | Attending: Internal Medicine | Admitting: Internal Medicine

## 2017-08-08 ENCOUNTER — Encounter: Payer: Self-pay | Admitting: Internal Medicine

## 2017-08-08 VITALS — BP 130/60 | HR 69 | Temp 98.6°F | Resp 16 | Wt 136.4 lb

## 2017-08-08 DIAGNOSIS — Z90710 Acquired absence of both cervix and uterus: Secondary | ICD-10-CM | POA: Insufficient documentation

## 2017-08-08 DIAGNOSIS — R030 Elevated blood-pressure reading, without diagnosis of hypertension: Secondary | ICD-10-CM

## 2017-08-08 DIAGNOSIS — Z23 Encounter for immunization: Secondary | ICD-10-CM | POA: Insufficient documentation

## 2017-08-08 DIAGNOSIS — E785 Hyperlipidemia, unspecified: Secondary | ICD-10-CM | POA: Insufficient documentation

## 2017-08-08 DIAGNOSIS — Z7984 Long term (current) use of oral hypoglycemic drugs: Secondary | ICD-10-CM | POA: Insufficient documentation

## 2017-08-08 DIAGNOSIS — E11649 Type 2 diabetes mellitus with hypoglycemia without coma: Secondary | ICD-10-CM | POA: Insufficient documentation

## 2017-08-08 DIAGNOSIS — Z7982 Long term (current) use of aspirin: Secondary | ICD-10-CM | POA: Insufficient documentation

## 2017-08-08 DIAGNOSIS — R109 Unspecified abdominal pain: Secondary | ICD-10-CM | POA: Insufficient documentation

## 2017-08-08 DIAGNOSIS — E119 Type 2 diabetes mellitus without complications: Secondary | ICD-10-CM

## 2017-08-08 DIAGNOSIS — Z1211 Encounter for screening for malignant neoplasm of colon: Secondary | ICD-10-CM

## 2017-08-08 DIAGNOSIS — E039 Hypothyroidism, unspecified: Secondary | ICD-10-CM | POA: Insufficient documentation

## 2017-08-08 LAB — POCT GLYCOSYLATED HEMOGLOBIN (HGB A1C): HEMOGLOBIN A1C: 7.2

## 2017-08-08 LAB — GLUCOSE, POCT (MANUAL RESULT ENTRY): POC GLUCOSE: 92 mg/dL (ref 70–99)

## 2017-08-08 MED FILL — ?ATORVASTATIN 40MG TABLET: 40 | 30 days supply | Qty: 30 | Fill #1

## 2017-08-08 MED FILL — LEVOTHYROXINE 50 MCG TABLET: 50 | 30 days supply | Qty: 30 | Fill #2

## 2017-08-08 MED FILL — ?METFORMIN HCL 1,000 MG TAB: 1000 | 30 days supply | Qty: 60 | Fill #2

## 2017-08-08 NOTE — Progress Notes (Signed)
Patient ID: Cynthia Wiley, female    DOB: September 04, 1966  MRN: [id number]  CC: re-establish and Diabetes   Subjective: Cynthia Wiley is a 51 y.o. female who presents for chronic ds management. Last saw Dr. Adrian Blackwater 05/2017 Her concerns today include:  pt with hx of  DM type 2, HL and hypothyroid  1. DM:  -on last visit, Metformin inc to 1.5 gram a.m/1 gram p.m. However pt has remained on 1 gram BID because hypoglycemia  (70-80) with trail of  inc.  -checks BS every morning. Gives range of 99-128 -eating more fresh veggies -Exercise: walking 1-2 hrs daily.  2. HL: tolerating Lipitor. Last lipid profile 02/2017 was at goal  3. Hypothyroid:  -compliant with med -no significant wgh changes No feeling of being hot or cold all the time.   HM: due for flu shot, eye exam and colon CA screening. Pt has [place] card which does not cover eye exams.  Patient Active Problem List   Diagnosis Date Noted  . Hyperlipidemia 08/07/2017  . Abdominal pain 03/26/2017  . DM2 (diabetes mellitus, type 2) ([place]) 01/22/2015  . Hypothyroidism 01/22/2015  . S/P hysterectomy with oophorectomy 01/22/2015     Current Outpatient Prescriptions on File Prior to Visit  Medication Sig Dispense Refill  . aspirin 81 MG tablet Take 1 tablet (81 mg total) by mouth daily. (Patient not taking: Reported on 05/09/2017) 30 tablet 11  . atorvastatin (LIPITOR) 40 MG tablet TAKE 1 TABLET BY MOUTH DAILY 30 tablet 2  . Blood Glucose Monitoring Suppl (TRUE METRIX METER) W/DEVICE KIT 1 each by Does not apply route as needed. 1 kit 0  . glucose blood (TRUE METRIX BLOOD GLUCOSE TEST) test strip 1 each by Other route 2 (two) times daily. 100 each 11  . levothyroxine (SYNTHROID, LEVOTHROID) 50 MCG tablet TAKE 1 TABLET BY MOUTH DAILY 30 tablet 2  . metFORMIN (GLUCOPHAGE) 1000 MG tablet TOMA UNA TABLET DOS VECES AL DIA CON COMIDA 60 tablet 2  . TRUE METRIX BLOOD GLUCOSE TEST test strip USE 2 TEST BLOOD SUGARS 2 TIMES  DAILY 100 each 11  . TRUEPLUS LANCETS 28G MISC 1 each by Does not apply route 2 (two) times daily. 100 each 11   No current facility-administered medications on file prior to visit.     Allergies  Allergen Reactions  . Onglyza [Saxagliptin] Nausea Only  . Sulfonylureas Other (See Comments)    Severe stomach ache with Amaryl and glipizide XL     Social History   Social History  . Marital status: Single    Spouse name: N/A  . Number of children: N/A  . Years of education: N/A   Occupational History  . Not on file.   Social History Main Topics  . Smoking status: Never Smoker  . Smokeless tobacco: Never Used  . Alcohol use No  . Drug use: No  . Sexual activity: Yes    Birth control/ protection: None   Other Topics Concern  . Not on file   Social History Narrative  . No narrative on file    Family History  Problem Relation Age of Onset  . Diabetes Father   . Heart disease Father   . Diabetes Sister   . Hyperlipidemia Brother     Past Surgical History:  Procedure Laterality Date  . ABDOMINAL HYSTERECTOMY N/A 01/31/2013   Procedure: HYSTERECTOMY ABDOMINAL;  Surgeon: Lavonia Drafts, MD;  Location: [facility];  Service: Gynecology;  Laterality: N/A;  .  BILATERAL SALPINGECTOMY Bilateral 01/31/2013   Procedure: BILATERAL SALPINGECTOMY;  Surgeon: Lavonia Drafts, MD;  Location: [facility];  Service: Gynecology;  Laterality: Bilateral;  . CYSTOSCOPY WITH STENT PLACEMENT Bilateral 01/31/2013   Procedure: CYSTOSCOPY WITH STENT PLACEMENT;  Surgeon: Malka So, MD;  Location: [facility];  Service: Urology;  Laterality: Bilateral;  with c-arm    ROS: Review of Systems Neg except as above PHYSICAL EXAM: BP 130/60   Pulse 69   Temp 98.6 F (37 C) (Oral)   Resp 16   Wt 136 lb 6.4 oz (61.9 kg)   LMP 01/19/2013   SpO2 100%   BMI 28.51 kg/m   BP 130/60 Wt Readings from Last 3 Encounters:  08/08/17 136 lb 6.4 oz (61.9 kg)  05/09/17 138 lb 12.8 oz (63 kg)  03/24/17  141 lb 3.2 oz (64 kg)   Physical Exam General appearance - alert, well appearing, and in no distress Mental status - alert, oriented to person, place, and time, normal mood, behavior, speech, dress, motor activity, and thought processes Neck - supple, no significant adenopathy Chest - clear to auscultation, no wheezes, rales or rhonchi, symmetric air entry Heart - normal rate, regular rhythm, normal S1, S2, no murmurs, rubs, clicks or gallops Extremities - peripheral pulses normal, no pedal edema, no clubbing or cyanosis  Results for orders placed or performed in visit on 08/08/17  POCT glucose (manual entry)  Result Value Ref Range   POC Glucose 92 70 - 99 mg/dl  HgB A1c  Result Value Ref Range   Hemoglobin A1C 7.2    Lab Results  Component Value Date   HGBA1C 7.2 08/08/2017   Results for orders placed or performed in visit on 08/08/17  POCT glucose (manual entry)  Result Value Ref Range   POC Glucose 92 70 - 99 mg/dl  HgB A1c  Result Value Ref Range   Hemoglobin A1C 7.2     Lab Results  Component Value Date   CHOL 98 02/07/2017   HDL 36 (L) 02/07/2017   LDLCALC 45 02/07/2017   TRIG 86 02/07/2017   CHOLHDL 2.7 02/07/2017     Chemistry      Component Value Date/Time   NA 138 02/07/2017 1626   K 4.9 02/07/2017 1626   CL 101 02/07/2017 1626   CO2 24 02/07/2017 1626   BUN 8 02/07/2017 1626   CREATININE 0.61 02/07/2017 1626      Component Value Date/Time   CALCIUM 9.6 02/07/2017 1626   ALKPHOS 112 01/22/2015 1047   AST 12 01/22/2015 1047   ALT 12 01/22/2015 1047   BILITOT 0.5 01/22/2015 1047     Lab Results  Component Value Date   WBC 7.1 01/22/2015   HGB 12.7 01/22/2015   HCT 39.7 01/22/2015   MCV 83.9 01/22/2015   PLT 366 01/22/2015   Lab Results  Component Value Date   TSH 2.13 02/07/2017    ASSESSMENT AND PLAN: 1. Type 2 diabetes mellitus without complication, without long-term current use of insulin (HCC) Reported BS at goal. Cont Metformin 1  gram BID Continue healthy eating habits and regular exercise Encouraged to have eye exam at [facility] or [facility] since [facility] does not cover for it. - POCT glucose (manual entry) - HgB A1c  2. Hypothyroidism, unspecified type -continue Levothyroxine  3. Hyperlipidemia, unspecified hyperlipidemia type -continue Liptor  4. Colon cancer screening - Fecal occult blood, imunochemical  5. Need for influenza vaccination - Flu Vaccine QUAD 6+ mos PF IM (Fluarix Quad  PF)  6. Elevated BP without diagnosis of hypertension -DASH discussed and encouraged.  Patient was given the opportunity to ask questions.  Patient verbalized understanding of the plan and was able to repeat key elements of the plan.  [employer] interpreter used during this encounter.  Orders Placed This Encounter  Procedures  . Fecal occult blood, imunochemical  . Flu Vaccine QUAD 6+ mos PF IM (Fluarix Quad PF)  . POCT glucose (manual entry)  . HgB A1c     Requested Prescriptions    No prescriptions requested or ordered in this encounter    Return in about 3 months (around 11/07/2017).  Karle Plumber, MD, FACP

## 2017-08-08 NOTE — Patient Instructions (Signed)
Influenza Virus Vaccine injection (Fluarix) Qu es este medicamento? La VACUNA ANTIGRIPAL ayuda a disminuir el riesgo de contraer la influenza, tambin conocida como la gripe. La vacuna solo ayuda a protegerle contra algunas cepas de influenza. Esta vacuna no ayuda a reducir el riesgo de contraer influenza pandmica H1N1. Este medicamento puede ser utilizado para otros usos; si tiene alguna pregunta consulte con su proveedor de atencin mdica o con su farmacutico. MARCAS COMUNES: Fluarix, Fluzone Qu le debo informar a mi profesional de la salud antes de tomar este medicamento? Necesita saber si usted presenta alguno de los siguientes problemas o situaciones: -trastorno de sangrado como hemofilia -fiebre o infeccin -sndrome de Guillain-Barre u otros problemas neurolgicos -problemas del sistema inmunolgico -infeccin por el virus de la inmunodeficiencia humana (VIH) o SIDA -niveles bajos de plaquetas en la sangre -esclerosis mltiple -una reaccin alrgica o inusual a las vacunas antigripales, a los huevos, protenas de pollo, al ltex, a la gentamicina, a otros medicamentos, alimentos, colorantes o conservantes -si est embarazada o buscando quedar embarazada -si est amamantando a un beb Cmo debo utilizar este medicamento? Esta vacuna se administra mediante inyeccin por va intramuscular. Lo administra un profesional de la salud. Recibir una copia de informacin escrita sobre la vacuna antes de cada vacuna. Asegrese de leer este folleto cada vez cuidadosamente. Este folleto puede cambiar con frecuencia. Hable con su pediatra para informarse acerca del uso de este medicamento en nios. Puede requerir atencin especial. Sobredosis: Pngase en contacto inmediatamente con un centro toxicolgico o una sala de urgencia si usted cree que haya tomado demasiado medicamento. ATENCIN: Este medicamento es solo para usted. No comparta este medicamento con nadie. Qu sucede si me olvido de  una dosis? No se aplica en este caso. Qu puede interactuar con este medicamento? -quimioterapia o radioterapia -medicamentos que suprimen el sistema inmunolgico, tales como etanercept, anakinra, infliximab y adalimumab -medicamentos que tratan o previenen cogulos sanguneos, como warfarina -fenitona -medicamentos esteroideos, como la prednisona o la cortisona -teofilina -vacunas Puede ser que esta lista no menciona todas las posibles interacciones. Informe a su profesional de la salud de todos los productos a base de hierbas, medicamentos de venta libre o suplementos nutritivos que est tomando. Si usted fuma, consume bebidas alcohlicas o si utiliza drogas ilegales, indqueselo tambin a su profesional de la salud. Algunas sustancias pueden interactuar con su medicamento. A qu debo estar atento al usar este medicamento? Informe a su mdico o a su profesional de la salud sobre todos los efectos secundarios que persistan despus de 3 das. Llame a su proveedor de atencin mdica si se presentan sntomas inusuales dentro de las 6 semanas posteriores a la vacunacin. Es posible que todava pueda contraer la gripe, pero la enfermedad no ser tan fuerte como normalmente. No puede contraer la gripe de esta vacuna. La vacuna antigripal no le protege contra resfros u otras enfermedades que pueden causar fiebre. Debe vacunarse cada ao. Qu efectos secundarios puedo tener al utilizar este medicamento? Efectos secundarios que debe informar a su mdico o a su profesional de la salud tan pronto como sea posible: -reacciones alrgicas como erupcin cutnea, picazn o urticarias, hinchazn de la cara, labios o lengua Efectos secundarios que, por lo general, no requieren atencin mdica (debe informarlos a su mdico o a su profesional de la salud si persisten o si son molestos): -fiebre -dolor de cabeza -molestias y dolores musculares -dolor, sensibilidad, enrojecimiento o hinchazn en el lugar de la  inyeccin -cansancio o debilidad Puede ser que esta lista no   menciona todos los posibles efectos secundarios. Comunquese a su mdico por asesoramiento mdico sobre los efectos secundarios. Usted puede informar los efectos secundarios a la FDA por telfono al 1-800-FDA-1088. Dnde debo guardar mi medicina? Esta vacuna se administra solamente en clnicas, farmacias, consultorio mdico u otro consultorio de un profesional de la salud y no necesitar guardarlo en su domicilio. ATENCIN: Este folleto es un resumen. Puede ser que no cubra toda la posible informacin. Si usted tiene preguntas acerca de esta medicina, consulte con su mdico, su farmacutico o su profesional de la salud.  2018 Elsevier/Gold Standard (2010-05-25 15:31:40)  

## 2017-09-04 ENCOUNTER — Other Ambulatory Visit: Payer: Self-pay | Admitting: Family Medicine

## 2017-09-04 DIAGNOSIS — E118 Type 2 diabetes mellitus with unspecified complications: Secondary | ICD-10-CM

## 2017-09-04 DIAGNOSIS — E039 Hypothyroidism, unspecified: Secondary | ICD-10-CM

## 2017-09-04 MED FILL — ?METFORMIN HCL 1,000 MG TAB: 1000 | 30 days supply | Qty: 60 | Fill #0

## 2017-09-04 MED FILL — LEVOTHYROXINE 50 MCG TABLET: 50 | 30 days supply | Qty: 30 | Fill #0

## 2017-09-04 MED FILL — ?ATORVASTATIN 40MG TABLET: 40 | 30 days supply | Qty: 30 | Fill #2

## 2017-10-02 MED FILL — ?METFORMIN HCL 1,000 MG TAB: 1000 | 30 days supply | Qty: 60 | Fill #1

## 2017-10-02 MED FILL — LEVOTHYROXINE 50 MCG TABLET: 50 | 30 days supply | Qty: 30 | Fill #1

## 2017-10-05 MED FILL — TRUE METRIX TEST STRIP: 50 days supply | Qty: 100 | Fill #0

## 2017-10-06 ENCOUNTER — Other Ambulatory Visit: Payer: Self-pay | Admitting: Pharmacist

## 2017-10-06 DIAGNOSIS — E119 Type 2 diabetes mellitus without complications: Secondary | ICD-10-CM

## 2017-10-06 MED ORDER — ATORVASTATIN CALCIUM 40 MG PO TABS
40.0000 mg | ORAL_TABLET | Freq: Every day | ORAL | 2 refills | Status: DC
Start: 2017-10-06 — End: 2018-01-10

## 2017-10-06 MED FILL — ?ATORVASTATIN 40MG TABLET: 40 | 30 days supply | Qty: 30 | Fill #0

## 2017-11-07 ENCOUNTER — Encounter: Payer: Self-pay | Admitting: Internal Medicine

## 2017-11-07 ENCOUNTER — Ambulatory Visit: Payer: Self-pay | Attending: Internal Medicine | Admitting: Internal Medicine

## 2017-11-07 VITALS — BP 142/73 | HR 78 | Temp 98.0°F | Wt 142.8 lb

## 2017-11-07 DIAGNOSIS — E039 Hypothyroidism, unspecified: Secondary | ICD-10-CM | POA: Insufficient documentation

## 2017-11-07 DIAGNOSIS — Z7984 Long term (current) use of oral hypoglycemic drugs: Secondary | ICD-10-CM | POA: Insufficient documentation

## 2017-11-07 DIAGNOSIS — Z1239 Encounter for other screening for malignant neoplasm of breast: Secondary | ICD-10-CM

## 2017-11-07 DIAGNOSIS — Z1231 Encounter for screening mammogram for malignant neoplasm of breast: Secondary | ICD-10-CM

## 2017-11-07 DIAGNOSIS — Z90710 Acquired absence of both cervix and uterus: Secondary | ICD-10-CM | POA: Insufficient documentation

## 2017-11-07 DIAGNOSIS — Z1211 Encounter for screening for malignant neoplasm of colon: Secondary | ICD-10-CM

## 2017-11-07 DIAGNOSIS — F4320 Adjustment disorder, unspecified: Secondary | ICD-10-CM | POA: Insufficient documentation

## 2017-11-07 DIAGNOSIS — E785 Hyperlipidemia, unspecified: Secondary | ICD-10-CM | POA: Insufficient documentation

## 2017-11-07 DIAGNOSIS — Z7982 Long term (current) use of aspirin: Secondary | ICD-10-CM | POA: Insufficient documentation

## 2017-11-07 DIAGNOSIS — Z79890 Hormone replacement therapy (postmenopausal): Secondary | ICD-10-CM | POA: Insufficient documentation

## 2017-11-07 DIAGNOSIS — F4321 Adjustment disorder with depressed mood: Secondary | ICD-10-CM

## 2017-11-07 DIAGNOSIS — E119 Type 2 diabetes mellitus without complications: Secondary | ICD-10-CM | POA: Insufficient documentation

## 2017-11-07 DIAGNOSIS — R030 Elevated blood-pressure reading, without diagnosis of hypertension: Secondary | ICD-10-CM | POA: Insufficient documentation

## 2017-11-07 DIAGNOSIS — Z888 Allergy status to other drugs, medicaments and biological substances status: Secondary | ICD-10-CM | POA: Insufficient documentation

## 2017-11-07 DIAGNOSIS — Z8249 Family history of ischemic heart disease and other diseases of the circulatory system: Secondary | ICD-10-CM | POA: Insufficient documentation

## 2017-11-07 DIAGNOSIS — Z833 Family history of diabetes mellitus: Secondary | ICD-10-CM | POA: Insufficient documentation

## 2017-11-07 DIAGNOSIS — Z79899 Other long term (current) drug therapy: Secondary | ICD-10-CM | POA: Insufficient documentation

## 2017-11-07 DIAGNOSIS — Z882 Allergy status to sulfonamides status: Secondary | ICD-10-CM | POA: Insufficient documentation

## 2017-11-07 LAB — GLUCOSE, POCT (MANUAL RESULT ENTRY): POC GLUCOSE: 125 mg/dL — AB (ref 70–99)

## 2017-11-07 MED FILL — LEVOTHYROXINE 50 MCG TABLET: 50 | 30 days supply | Qty: 30 | Fill #2

## 2017-11-07 MED FILL — ?METFORMIN HCL 1,000 MG TAB: 1000 | 30 days supply | Qty: 60 | Fill #2

## 2017-11-07 MED FILL — ?ATORVASTATIN 40MG TABLET: 40 | 30 days supply | Qty: 30 | Fill #1

## 2017-11-07 NOTE — Patient Instructions (Signed)
Hipertensión  Hypertension  La hipertensión, conocida comúnmente como presión arterial alta, se produce cuando la sangre bombea en las arterias con mucha fuerza. Las arterias son los vasos sanguíneos que transportan la sangre desde el corazón al resto del cuerpo. La hipertensión hace que el corazón haga más esfuerzo para bombear sangre y puede provocar que las arterias se estrechen o endurezcan. La hipertensión no tratada o no controlada puede causar infarto de miocardio, accidentes cerebrovasculares, enfermedad renal y otros problemas.  Una lectura de la presión arterial consiste de un número más alto sobre un número más bajo. En condiciones ideales, la presión arterial debe estar por debajo de 120/80. El primer número ("superior") es la presión sistólica. Es la medida de la presión de las arterias cuando el corazón late. El segundo número ("inferior") es la presión diastólica. Es la medida de la presión en las arterias cuando el corazón se relaja.  ¿Cuáles son las causas?  Se desconoce la causa de esta afección.  ¿Qué incrementa el riesgo?  Algunos factores de riesgo de hipertensión están bajo su control. Otros no.  Factores que puede modificar  · Fumar.  · Tener diabetes mellitus tipo 2, colesterol alto, o ambos.  · No hacer la cantidad suficiente de actividad física o ejercicio.  · Tener sobrepeso.  · Consumir mucha grasa, azúcar, calorías o sal (sodio) en su dieta.  · Beber alcohol en exceso.  Factores que son difíciles o imposibles de modificar  · Tener enfermedad renal crónica.  · Tener antecedentes familiares de presión arterial alta.  · La edad. Los riesgos aumentan con la edad.  · La raza. El riesgo es mayor para las personas afroamericanas.  · El sexo. Antes de los 45 años, los hombres corren más riesgo que las mujeres. Después de los 65 años, las mujeres corren más riesgo que los hombres.  · Tener apnea obstructiva del sueño.  · El estrés.  ¿Cuáles son los signos o los síntomas?   La presión arterial extremadamente alta (crisis hipertensiva) puede provocar:  · Dolor de cabeza.  · Ansiedad.  · Falta de aire.  · Hemorragia nasal.  · Náuseas y vómitos.  · Dolor de pecho intenso.  · Una crisis de movimientos que no puede controlar (convulsiones).    ¿Cómo se diagnostica?  Esta afección se diagnostica midiendo su presión arterial mientras se encuentra sentado, con el brazo apoyado sobre una superficie. El brazalete del tensiómetro debe colocarse directamente sobre la piel de la parte superior del brazo y al nivel de su corazón. Debe medirla al menos dos veces en el mismo brazo. Determinadas condiciones pueden causar una diferencia de presión arterial entre el brazo izquierdo y el derecho.  Ciertos factores pueden provocar que las lecturas de la presión arterial sean inferiores o superiores a lo normal (elevadas) por un período corto de tiempo:  · Si su presión arterial es más alta cuando se encuentra en el consultorio del médico que cuando la mide en su hogar, se denomina "hipertensión de bata blanca". La mayoría de las personas que tienen esta afección no deben ser medicadas.  · Si su presión arterial es más alta en el hogar que cuando se encuentra en el consultorio del médico, se denomina "hipertensión enmascarada". La mayoría de las personas que tienen esta afección deben ser medicadas para controlar la presión arterial.    Si tiene una lecturas de presión arterial alta durante una visita o si tiene presión arterial normal con otros factores de riesgo:  · Es posible que se le   pida que regrese otro día para volver a controlar su presión arterial.  · Se le puede pedir que se controle la presión arterial en su casa durante 1 semana o más.    Si se le diagnostica hipertensión, es posible que se le realicen otros análisis de sangre o estudios de diagnóstico por imágenes para ayudar a su médico a comprender su riesgo general de tener otras afecciones.  ¿Cómo se trata?   Esta afección se trata haciendo cambios saludables en el estilo de vida, tales como ingerir alimentos saludables, realizar más ejercicio y reducir el consumo de alcohol. El médico puede recetarle medicamentos si los cambios en el estilo de vida no son suficientes para lograr controlar la presión arterial y si:  · Su presión arterial sistólica está por encima de 130.  · Su presión arterial diastólica está por encima de 80.    La presión arterial deseada puede variar en función de las enfermedades, la edad y otros factores personales.  Siga estas instrucciones en su casa:  Comida y bebida  · Siga una dieta con alto contenido de fibras y potasio, y con bajo contenido de sodio, azúcar agregada y grasas. Un ejemplo de plan alimenticio es la dieta DASH (Dietary Approaches to Stop Hypertension, Métodos alimenticios para detener la hipertensión). Para alimentarse de esta manera:  ? Coma mucha fruta y verdura fresca. Trate de que la mitad del plato de cada comida sea de frutas y verduras.  ? Coma cereales integrales, como pasta integral, arroz integral y pan integral. Llene aproximadamente un cuarto del plato con cereales integrales.  ? Coma y beba productos lácteos con bajo contenido de grasa, como leche descremada o yogur bajo en grasas.  ? Evite la ingesta de cortes de carne grasa, carne procesada o curada, y carne de ave con piel. Llene aproximadamente un cuarto del plato con proteínas magras, como pescado, pollo sin piel, frijoles, huevos y tofu.  ? Evite ingerir alimentos prehechos o procesados. En general, estos tienen mayor cantidad de sodio, azúcar agregada y grasa.  · Reduzca su ingesta diaria de sodio. La mayoría de las personas que tienen hipertensión deben comer menos de 1500 mg de sodio por día.  · Limite el consumo de alcohol a no más de 1 medida por día si es mujer y no está embarazada y a 2 medidas por día si es hombre. Una medida equivale  a 12 onzas de cerveza, 5 onzas de vino o 1½ onzas de bebidas alcohólicas de alta graduación.  Estilo de vida  · Trabaje con su médico para mantener un peso saludable o perder peso. Pregúntele cual es su peso recomendado.  · Realice al menos 30 minutos de ejercicio que haga que se acelere su corazón (ejercicio aeróbico) la mayoría de los días de la semana. Estas actividades pueden incluir caminar, nadar o andar en bicicleta.  · Incluya ejercicios para fortalecer sus músculos (ejercicios de resistencia), como pilates o levantamiento de pesas, como parte de su rutina semanal de ejercicios. Intente realizar 30 minutos de este tipo de ejercicios al menos tres días a la semana.  · No consuma ningún producto que contenga nicotina o tabaco, como cigarrillos y cigarrillos electrónicos. Si necesita ayuda para dejar de fumar, consulte al médico.  · Contrólese la presión arterial en su casa según las indicaciones del médico.  · Concurra a todas las visitas de control como se lo haya indicado el médico. Esto es importante.  Medicamentos  · Tome los medicamentos de venta libre y los recetados solamente como se   lo haya indicado el médico. Siga cuidadosamente las indicaciones. Los medicamentos para la presión arterial deben tomarse según las indicaciones.  · No omita las dosis de medicamentos para la presión arterial. Si lo hace, estará en riesgo de tener problemas y puede hacer que los medicamentos sean menos eficaces.  · Pregúntele a su médico a qué efectos secundarios o reacciones a los medicamentos debe prestar atención.  Comuníquese con un médico si:  · Piensa que tiene una reacción a un medicamento que está tomando.  · Tiene dolores de cabeza frecuentes (recurrentes).  · Siente mareos.  · Tiene hinchazón en los tobillos.  · Tiene problemas de visión.  Solicite ayuda de inmediato si:  · Siente un dolor de cabeza intenso o confusión.  · Siente debilidad inusual o adormecimiento.  · Siente que va a desmayarse.   · Siente un dolor intenso en el pecho o el abdomen.  · Vomita repetidas veces.  · Tiene dificultad para respirar.  Resumen  · La hipertensión se produce cuando la sangre bombea en las arterias con mucha fuerza. Si esta afección no se controla, podría correr riesgo de tener complicaciones graves.  · La presión arterial deseada puede variar en función de las enfermedades, la edad y otros factores personales. Para la mayoría de las personas, una presión arterial normal es menor que 120/80.  · La hipertensión se trata con cambios en el estilo de vida, medicamentos o una combinación de ambos. Los cambios en el estilo de vida incluyen pérdida de peso, ingerir alimentos sanos, seguir una dieta baja en sodio, hacer más ejercicio y limitar el consumo de alcohol.  Esta información no tiene como fin reemplazar el consejo del médico. Asegúrese de hacerle al médico cualquier pregunta que tenga.  Document Released: 11/21/2005 Document Revised: 11/02/2016 Document Reviewed: 11/02/2016  Elsevier Interactive Patient Education © 2018 Elsevier Inc.

## 2017-11-07 NOTE — Progress Notes (Signed)
Patient ID: Cynthia Wiley, female    DOB: [birth year]  MRN: [id number]  CC: Diabetes   Subjective: Cynthia Wiley is a 51 y.o. female who presents for chronic ds management. Last seen 08/2017. Her concerns today include:  pt with hx of  DM type 2, HL and hypothyroid  1. Feeling a bit sad due to recent passing of her sister. -good family support   2. DM:  Checking BS once a day. Gives range 99-125 Compliant with Metformin Doing well with eating habits - more veggies, avoiding fats Exercise - walking 40 mins daily Had eye exam at [facility] in 09/2017. Has new glasses  3. Hypothyroid: compliant with Levoxyl. Feels cold sometimes. No palpitations, diarrhea.   4. Colon CA screen: she mailed in FIT test the day after it was given. I have not received results.   5. BP borderline last visit.  Not limiting salt as much as she should as discussed on last visit  HM: due for MMG   Patient Active Problem List   Diagnosis Date Noted  . Hyperlipidemia 08/07/2017  . DM2 (diabetes mellitus, type 2) ([place]) 01/22/2015  . Hypothyroidism 01/22/2015     Current Outpatient Medications on File Prior to Visit  Medication Sig Dispense Refill  . atorvastatin (LIPITOR) 40 MG tablet Take 1 tablet (40 mg total) by mouth daily. 30 tablet 2  . Blood Glucose Monitoring Suppl (TRUE METRIX METER) W/DEVICE KIT 1 each by Does not apply route as needed. 1 kit 0  . glucose blood (TRUE METRIX BLOOD GLUCOSE TEST) test strip 1 each by Other route 2 (two) times daily. 100 each 11  . levothyroxine (SYNTHROID, LEVOTHROID) 50 MCG tablet TAKE 1 TABLET BY MOUTH DAILY 30 tablet 2  . metFORMIN (GLUCOPHAGE) 1000 MG tablet TOMA UNA TABLET DOS VECES AL DIA CON COMIDA 60 tablet 2  . TRUE METRIX BLOOD GLUCOSE TEST test strip USE 2 TEST BLOOD SUGARS 2 TIMES DAILY 100 each 11  . TRUEPLUS LANCETS 28G MISC 1 each by Does not apply route 2 (two) times daily. 100 each 11  . aspirin 81 MG tablet Take 1 tablet  (81 mg total) by mouth daily. (Patient not taking: Reported on 05/09/2017) 30 tablet 11   No current facility-administered medications on file prior to visit.     Allergies  Allergen Reactions  . Onglyza [Saxagliptin] Nausea Only  . Sulfonylureas Other (See Comments)    Severe stomach ache with Amaryl and glipizide XL     Social History   Socioeconomic History  . Marital status: Single    Spouse name: Not on file  . Number of children: Not on file  . Years of education: Not on file  . Highest education level: Not on file  Social Needs  . Financial resource strain: Not on file  . Food insecurity - worry: Not on file  . Food insecurity - inability: Not on file  . Transportation needs - medical: Not on file  . Transportation needs - non-medical: Not on file  Occupational History  . Not on file  Tobacco Use  . Smoking status: Never Smoker  . Smokeless tobacco: Never Used  Substance and Sexual Activity  . Alcohol use: No  . Drug use: No  . Sexual activity: Yes    Birth control/protection: None  Other Topics Concern  . Not on file  Social History Narrative  . Not on file    Family History  Problem Relation Age of Onset  .  Diabetes Father   . Heart disease Father   . Diabetes Sister   . Hyperlipidemia Brother     Past Surgical History:  Procedure Laterality Date  . ABDOMINAL HYSTERECTOMY N/A 01/31/2013   Procedure: HYSTERECTOMY ABDOMINAL;  Surgeon: Lavonia Drafts, MD;  Location: [facility];  Service: Gynecology;  Laterality: N/A;  . BILATERAL SALPINGECTOMY Bilateral 01/31/2013   Procedure: BILATERAL SALPINGECTOMY;  Surgeon: Lavonia Drafts, MD;  Location: [facility];  Service: Gynecology;  Laterality: Bilateral;  . CYSTOSCOPY WITH STENT PLACEMENT Bilateral 01/31/2013   Procedure: CYSTOSCOPY WITH STENT PLACEMENT;  Surgeon: Malka So, MD;  Location: [facility];  Service: Urology;  Laterality: Bilateral;  with c-arm    ROS: Review of Systems  PHYSICAL EXAM: BP  (!) 142/73   Pulse 78   Temp 98 F (36.7 C) (Oral)   Wt 142 lb 12.8 oz (64.8 kg)   LMP 01/19/2013   SpO2 99%   BMI 29.85 kg/m   Wt Readings from Last 3 Encounters:  11/07/17 142 lb 12.8 oz (64.8 kg)  08/08/17 136 lb 6.4 oz (61.9 kg)  05/09/17 138 lb 12.8 oz (63 kg)  Repeat BP 144/70  Physical Exam  General appearance - alert, well appearing, and in no distress Mental status - alert, oriented to person, place, and time, normal mood, behavior, speech, dress, motor activity, and thought processes Eyes - pupils equal and reactive, extraocular eye movements intact Neck - supple, no significant adenopathy Chest - clear to auscultation, no wheezes, rales or rhonchi, symmetric air entry Heart - normal rate, regular rhythm, normal S1, S2, no murmurs, rubs, clicks or gallops Extremities - peripheral pulses normal, no pedal edema, no clubbing or cyanosis   Results for orders placed or performed in visit on 11/07/17  POCT glucose (manual entry)  Result Value Ref Range   POC Glucose 125 (A) 70 - 99 mg/dl   Lab Results  Component Value Date   HGBA1C 7.2 08/08/2017     ASSESSMENT AND PLAN: 1. Type 2 diabetes mellitus without complication, without long-term current use of insulin (HCC) -continue healthy eating habits, regular exercise and Metformin - POCT glucose (manual entry)  2. Grief reaction -pt reports she is doing okay and has good family support  3. Hypothyroidism, unspecified type Continue Levoxyl.   4. Breast cancer screening Refer for MMG  5. Colon cancer screening I sent message to our lab tech to inquire from [facility] for results of FIT   6. Elevated BP without diagnosis of hypertension -encourage to limit salt in foods and to check BP if she has device at home F/u in 1 mth for repeat check   Patient was given the opportunity to ask questions.  Patient verbalized understanding of the plan and was able to repeat key elements of the plan.  [employer] interpreter  used during this encounter.   Orders Placed This Encounter  Procedures  . POCT glucose (manual entry)     Requested Prescriptions    No prescriptions requested or ordered in this encounter    F/u in 1 mth Karle Plumber, MD, Rosalita Chessman

## 2017-12-08 ENCOUNTER — Ambulatory Visit: Payer: Self-pay | Attending: Internal Medicine | Admitting: *Deleted

## 2017-12-08 ENCOUNTER — Other Ambulatory Visit: Payer: Self-pay | Admitting: Internal Medicine

## 2017-12-08 VITALS — BP 135/79 | HR 84

## 2017-12-08 DIAGNOSIS — E118 Type 2 diabetes mellitus with unspecified complications: Secondary | ICD-10-CM

## 2017-12-08 DIAGNOSIS — Z0130 Encounter for examination of blood pressure without abnormal findings: Secondary | ICD-10-CM | POA: Insufficient documentation

## 2017-12-08 DIAGNOSIS — E039 Hypothyroidism, unspecified: Secondary | ICD-10-CM

## 2017-12-08 DIAGNOSIS — R030 Elevated blood-pressure reading, without diagnosis of hypertension: Secondary | ICD-10-CM

## 2017-12-08 MED FILL — ?ATORVASTATIN 40MG TABLET: 40 | 30 days supply | Qty: 30 | Fill #2

## 2017-12-08 MED FILL — TRUE METRIX TEST STRIP: 50 days supply | Qty: 100 | Fill #1

## 2017-12-08 MED FILL — LEVOTHYROXINE 50 MCG TABLET: 50 | 30 days supply | Qty: 30 | Fill #0

## 2017-12-08 MED FILL — ?METFORMIN HCL 1,000 MG TAB: 1000 | 30 days supply | Qty: 60 | Fill #0

## 2017-12-08 NOTE — Progress Notes (Signed)
Cynthia Wiley [id number]  Pt arrived to [facility]. Pt alert and oriented and arrives in good spirits. Last OV  11/07/17 with PCP. At OV BP reading was  142/73 Pt denies chest pain, SOB, HA, dizziness, or blurred vision.  Verified medication. Pt states medication was taken this morning.  Blood pressure reading: 135/79

## 2017-12-11 ENCOUNTER — Other Ambulatory Visit: Payer: Self-pay

## 2017-12-11 DIAGNOSIS — E119 Type 2 diabetes mellitus without complications: Secondary | ICD-10-CM

## 2017-12-11 MED ORDER — TRUEPLUS LANCETS 28G MISC: 1.0000 | Freq: Two times a day (BID) | 11 refills | Status: DC

## 2017-12-11 MED FILL — TRUEplus LANCETS 28G MISC: 50 days supply | Qty: 100 | Fill #0

## 2017-12-27 ENCOUNTER — Other Ambulatory Visit: Payer: Self-pay | Admitting: Obstetrics and Gynecology

## 2017-12-27 DIAGNOSIS — Z1231 Encounter for screening mammogram for malignant neoplasm of breast: Secondary | ICD-10-CM

## 2018-01-10 ENCOUNTER — Other Ambulatory Visit: Payer: Self-pay | Admitting: Internal Medicine

## 2018-01-10 DIAGNOSIS — E119 Type 2 diabetes mellitus without complications: Secondary | ICD-10-CM

## 2018-01-10 MED FILL — ?METFORMIN HCL 1,000 MG TAB: 1000 | 30 days supply | Qty: 60 | Fill #1

## 2018-01-10 MED FILL — LEVOTHYROXINE 50 MCG TABLET: 50 | 30 days supply | Qty: 30 | Fill #1

## 2018-01-10 MED FILL — ?ATORVASTATIN 40MG TABLET: 40 | 30 days supply | Qty: 30 | Fill #0

## 2018-02-01 ENCOUNTER — Encounter (HOSPITAL_COMMUNITY): Payer: Self-pay

## 2018-02-01 ENCOUNTER — Ambulatory Visit (HOSPITAL_COMMUNITY)
Admission: RE | Admit: 2018-02-01 | Discharge: 2018-02-01 | Disposition: A | Discharge status: Home / Self Care | Payer: Self-pay | Source: Ambulatory Visit | Attending: Obstetrics and Gynecology | Admitting: Obstetrics and Gynecology

## 2018-02-01 ENCOUNTER — Ambulatory Visit
Admission: RE | Admit: 2018-02-01 | Discharge: 2018-02-01 | Disposition: A | Discharge status: Home / Self Care | Payer: No Typology Code available for payment source | Source: Ambulatory Visit | Attending: Obstetrics and Gynecology | Admitting: Obstetrics and Gynecology

## 2018-02-01 VITALS — BP 131/57

## 2018-02-01 DIAGNOSIS — Z1231 Encounter for screening mammogram for malignant neoplasm of breast: Secondary | ICD-10-CM

## 2018-02-01 DIAGNOSIS — Z1239 Encounter for other screening for malignant neoplasm of breast: Secondary | ICD-10-CM

## 2018-02-01 NOTE — Progress Notes (Signed)
No complaints today.   Pap Smear: Pap smear not completed today. Last Pap smear was 11/23/2012 at the [facility] [place] [facility] and normal with negative HPV. Per patient has no history of an abnormal Pap smear. Patient has a history of a hysterectomy 01/31/2013 for fibroids. Patient no longer needs Pap smears due to her history of a hysterectomy for benign reasons per BCCCP and ACOG guidelines. Last Pap smear result is in Epic.  Physical exam: Breasts Breasts symmetrical. No skin abnormalities bilateral breasts. No nipple retraction bilateral breasts. No nipple discharge bilateral breasts. No lymphadenopathy. No lumps palpated bilateral breasts. No complaints of pain or tenderness on exam. Referred patient to the [facility] for a screening mammogram. Appointment scheduled for Thursday, February 01, 2018 at [place].        Pelvic/Bimanual No Pap smear completed today since patient has a history of a hysterectomy for benign reasons. Pap smear not indicated per BCCCP guidelines.   Smoking History: Patient has never smoked.  Patient Navigation: Patient education provided. Access to services provided for patient through [facility] program. Spanish interpreter provided.   Colorectal Cancer Screening: Per patient has never had a colonoscopy completed. Patient stated she completed a FIT test 6 months ago but did not receive her results. No complaints today. FIT Test given to patient to complete and return to BCCCP.  Breast and Cervical Cancer Risk Assessment: Patient has no family history of breast cancer, known genetic mutations, or radiation treatment to the chest before age 69. Patient has no history of cervical dysplasia, immunocompromised, or DES exposure in-utero.  Used Spanish interpreter Keith Rake from [facility].

## 2018-02-01 NOTE — Patient Instructions (Signed)
Explained breast self awareness with Cynthia Wiley. Patient did not need a Pap smear today due to patient has a history of a hysterectomy for benign reasons. Let patient know that she doesn't need any further Pap smears due to her history of a hysterectomy for benign reasons. Referred patient to the [facility] for a screening mammogram. Appointment scheduled for Thursday, February 01, 2018 at [place]. Let patient know the [facility] will follow up with her within the next couple weeks with results of mammogram by letter or phone. Cynthia Wiley verbalized understanding.  Cynthia Wiley, Cynthia Chaco, RN 11:06 AM

## 2018-02-02 ENCOUNTER — Encounter (HOSPITAL_COMMUNITY): Payer: Self-pay | Admitting: *Deleted

## 2018-02-05 ENCOUNTER — Encounter: Payer: Self-pay | Admitting: Internal Medicine

## 2018-02-05 ENCOUNTER — Ambulatory Visit: Payer: Self-pay | Attending: Internal Medicine | Admitting: Internal Medicine

## 2018-02-05 VITALS — BP 121/76 | HR 68 | Temp 98.1°F | Resp 16 | Ht 62.0 in | Wt 141.6 lb

## 2018-02-05 DIAGNOSIS — Z1211 Encounter for screening for malignant neoplasm of colon: Secondary | ICD-10-CM

## 2018-02-05 DIAGNOSIS — Z79899 Other long term (current) drug therapy: Secondary | ICD-10-CM | POA: Insufficient documentation

## 2018-02-05 DIAGNOSIS — E785 Hyperlipidemia, unspecified: Secondary | ICD-10-CM | POA: Insufficient documentation

## 2018-02-05 DIAGNOSIS — E039 Hypothyroidism, unspecified: Secondary | ICD-10-CM | POA: Insufficient documentation

## 2018-02-05 DIAGNOSIS — E119 Type 2 diabetes mellitus without complications: Secondary | ICD-10-CM | POA: Insufficient documentation

## 2018-02-05 DIAGNOSIS — Z7984 Long term (current) use of oral hypoglycemic drugs: Secondary | ICD-10-CM | POA: Insufficient documentation

## 2018-02-05 DIAGNOSIS — Z7982 Long term (current) use of aspirin: Secondary | ICD-10-CM | POA: Insufficient documentation

## 2018-02-05 LAB — POCT GLYCOSYLATED HEMOGLOBIN (HGB A1C): Hemoglobin A1C: 7.4

## 2018-02-05 LAB — GLUCOSE, POCT (MANUAL RESULT ENTRY): POC Glucose: 94 mg/dl (ref 70–99)

## 2018-02-05 MED ORDER — ATORVASTATIN CALCIUM 40 MG PO TABS: 40.0000 mg | ORAL_TABLET | Freq: Every day | ORAL | 6 refills | Status: DC

## 2018-02-05 MED ORDER — LEVOTHYROXINE SODIUM 50 MCG PO TABS: ORAL_TABLET | ORAL | 6 refills | Status: DC

## 2018-02-05 MED ORDER — ATORVASTATIN CALCIUM 40 MG PO TABS: 40.0000 mg | ORAL_TABLET | Freq: Every day | ORAL | 11 refills | Status: DC

## 2018-02-05 MED ORDER — LEVOTHYROXINE SODIUM 50 MCG PO TABS: 75.0000 ug | ORAL_TABLET | Freq: Every day | ORAL | 11 refills | Status: DC

## 2018-02-05 MED ORDER — METFORMIN HCL 1000 MG PO TABS: 1000.0000 mg | ORAL_TABLET | Freq: Two times a day (BID) | ORAL | 11 refills | Status: DC

## 2018-02-05 MED ORDER — METFORMIN HCL 1000 MG PO TABS: ORAL_TABLET | ORAL | 6 refills | Status: DC

## 2018-02-05 MED FILL — metFORMIN HCL 1000 MG TABS: 1000 | 30 days supply | Qty: 60 | Fill #2

## 2018-02-05 MED FILL — LEVOTHYROXINE 50 MCG TABLET: 50 | 20 days supply | Qty: 30 | Fill #0

## 2018-02-05 MED FILL — ATORVASTATIN 40 MG TABLET: 40 | 30 days supply | Qty: 30 | Fill #1

## 2018-02-05 NOTE — Progress Notes (Signed)
Patient ID: Cynthia Wiley, female    DOB: 1966/10/27  MRN: [id number]  CC: Diabetes   Subjective: Cynthia Wiley is a 52 y.o. female who presents for chronic ds management.  Her concerns today include:  pt with hx of DM type 2, HL and hypothyroid  1.  Elev BP on last visit.  She has cut back on salt and eating more fruits/veggies.  Continues to walk almost daily for minimum of 1 hr.  2.  DM:  Checks BS daily in a.m.  Highest 127. Med: compliant with Metformin Eating habits: doing well Exercise: as stated above No numbness in hands/feet  3.  Levothyroxine:  Taking Levothyroxine daily -wgh stable.  No heat or cold intolerance  4.  HL: tolerating Lipitor.  Never did receive the results of FIT test. Given another one when [facility].  She plans to mail in.  Had neg MMG last mth  Patient Active Problem List   Diagnosis Date Noted  . Hyperlipidemia 08/07/2017  . DM2 (diabetes mellitus, type 2) ([place]) 01/22/2015  . Hypothyroidism 01/22/2015     Current Outpatient Medications on File Prior to Visit  Medication Sig Dispense Refill  . aspirin 81 MG tablet Take 1 tablet (81 mg total) by mouth daily. 30 tablet 11  . Blood Glucose Monitoring Suppl (TRUE METRIX METER) W/DEVICE KIT 1 each by Does not apply route as needed. 1 kit 0  . glucose blood (TRUE METRIX BLOOD GLUCOSE TEST) test strip 1 each by Other route 2 (two) times daily. 100 each 11  . TRUE METRIX BLOOD GLUCOSE TEST test strip USE 2 TEST BLOOD SUGARS 2 TIMES DAILY 100 each 11  . TRUEPLUS LANCETS 28G MISC 1 each by Does not apply route 2 (two) times daily. 100 each 11   No current facility-administered medications on file prior to visit.     Allergies  Allergen Reactions  . Onglyza [Saxagliptin] Nausea Only  . Sulfonylureas Other (See Comments)    Severe stomach ache with Amaryl and glipizide XL     Social History   Socioeconomic History  . Marital status: Single    Spouse  name: Not on file  . Number of children: Not on file  . Years of education: Not on file  . Highest education level: Not on file  Social Needs  . Financial resource strain: Not on file  . Food insecurity - worry: Not on file  . Food insecurity - inability: Not on file  . Transportation needs - medical: Not on file  . Transportation needs - non-medical: Not on file  Occupational History  . Not on file  Tobacco Use  . Smoking status: Never Smoker  . Smokeless tobacco: Never Used  Substance and Sexual Activity  . Alcohol use: No  . Drug use: No  . Sexual activity: Yes    Birth control/protection: None  Other Topics Concern  . Not on file  Social History Narrative  . Not on file    Family History  Problem Relation Age of Onset  . Diabetes Father   . Heart disease Father   . Diabetes Sister   . Hyperlipidemia Brother     Past Surgical History:  Procedure Laterality Date  . ABDOMINAL HYSTERECTOMY N/A 01/31/2013   Procedure: HYSTERECTOMY ABDOMINAL;  Surgeon: Lavonia Drafts, MD;  Location: [facility];  Service: Gynecology;  Laterality: N/A;  . BILATERAL SALPINGECTOMY Bilateral 01/31/2013   Procedure: BILATERAL SALPINGECTOMY;  Surgeon: Hoyle Sauer  Harraway-Smith, MD;  Location: [facility];  Service: Gynecology;  Laterality: Bilateral;  . CYSTOSCOPY WITH STENT PLACEMENT Bilateral 01/31/2013   Procedure: CYSTOSCOPY WITH STENT PLACEMENT;  Surgeon: Malka So, MD;  Location: [facility];  Service: Urology;  Laterality: Bilateral;  with c-arm    ROS: Review of Systems Neg except as above PHYSICAL EXAM: BP 121/76   Pulse 68   Temp 98.1 F (36.7 C) (Oral)   Resp 16   Ht '5\' 2"'  (1.575 m)   Wt 141 lb 9.6 oz (64.2 kg)   LMP 01/19/2013   SpO2 100%   BMI 25.90 kg/m   Wt Readings from Last 3 Encounters:  02/05/18 141 lb 9.6 oz (64.2 kg)  11/07/17 142 lb 12.8 oz (64.8 kg)  08/08/17 136 lb 6.4 oz (61.9 kg)    Physical Exam  General appearance - alert, well appearing, and in no  distress Mental status - alert, oriented to person, place, and time, normal mood, behavior, speech, dress, motor activity, and thought processes Chest - clear to auscultation, no wheezes, rales or rhonchi, symmetric air entry Heart - normal rate, regular rhythm, normal S1, S2, no murmurs, rubs, clicks or gallops Extremities - peripheral pulses normal, no pedal edema, no clubbing or cyanosis  A1C 7.4/BS 94  ASSESSMENT AND PLAN: 1. Type 2 diabetes mellitus without complication, without long-term current use of insulin (HCC) Reported BS at goal.  Continue Metformin, healthy eating habits and regular exercise - POCT glucose (manual entry) - POCT glycosylated hemoglobin (Hb A1C) - CBC - Comprehensive metabolic panel - metFORMIN (GLUCOPHAGE) 1000 MG tablet; Take 1 tablet (1,000 mg total) by mouth 2 (two) times daily with a meal.  Dispense: 60 tablet; Refill: 11  2. Hyperlipidemia, unspecified hyperlipidemia type - Lipid panel - atorvastatin (LIPITOR) 40 MG tablet; Take 1 tablet (40 mg total) by mouth daily.  Dispense: 30 tablet; Refill: 11  3. Colon cancer screening Pt will completed FIT test given [facility] and mail in   4. Hypothyroidism - levothyroxine (SYNTHROID, LEVOTHROID) 50 MCG tablet; Take 1.5 tablets (75 mcg total) by mouth daily.  Dispense: 30 tablet; Refill: 11  Patient was given the opportunity to ask questions.  Patient verbalized understanding of the plan and was able to repeat key elements of the plan.  [employer] interpreter used during this encounter.    Requested Prescriptions   Signed Prescriptions Disp Refills  . metFORMIN (GLUCOPHAGE) 1000 MG tablet 60 tablet 11    Sig: Take 1 tablet (1,000 mg total) by mouth 2 (two) times daily with a meal.  . levothyroxine (SYNTHROID, LEVOTHROID) 50 MCG tablet 30 tablet 11    Sig: Take 1.5 tablets (75 mcg total) by mouth daily.  Marland Kitchen atorvastatin (LIPITOR) 40 MG tablet 30 tablet 11    Sig: Take 1 tablet (40 mg total) by  mouth daily.    Return in about 4 months (around 06/07/2018).  Karle Plumber, MD, FACP

## 2018-02-06 ENCOUNTER — Other Ambulatory Visit: Payer: Self-pay | Admitting: Internal Medicine

## 2018-02-06 ENCOUNTER — Telehealth: Payer: Self-pay

## 2018-02-06 ENCOUNTER — Other Ambulatory Visit: Payer: Self-pay

## 2018-02-06 LAB — COMPREHENSIVE METABOLIC PANEL
A/G RATIO: 1.3 (ref 1.2–2.2)
ALT: 12 IU/L (ref 0–32)
AST: 14 IU/L (ref 0–40)
Albumin: 4.3 g/dL (ref 3.5–5.5)
Alkaline Phosphatase: 99 IU/L (ref 39–117)
BUN/Creatinine Ratio: 16 (ref 9–23)
BUN: 12 mg/dL (ref 6–24)
Bilirubin Total: 0.4 mg/dL (ref 0.0–1.2)
CO2: 23 mmol/L (ref 20–29)
Calcium: 9.6 mg/dL (ref 8.7–10.2)
Chloride: 101 mmol/L (ref 96–106)
Creatinine, Ser: 0.75 mg/dL (ref 0.57–1.00)
GFR calc Af Amer: 107 mL/min/{1.73_m2} (ref >59)
GFR calc non Af Amer: 93 mL/min/{1.73_m2} (ref >59)
GLOBULIN, TOTAL: 3.2 g/dL (ref 1.5–4.5)
Glucose: 90 mg/dL (ref 65–99)
POTASSIUM: 4.7 mmol/L (ref 3.5–5.2)
SODIUM: 141 mmol/L (ref 134–144)
Total Protein: 7.5 g/dL (ref 6.0–8.5)

## 2018-02-06 LAB — CBC
Hematocrit: 34.4 % (ref 34.0–46.6)
Hemoglobin: 10.8 g/dL — ABNORMAL LOW (ref 11.1–15.9)
MCH: 25.3 pg — ABNORMAL LOW (ref 26.6–33.0)
MCHC: 31.4 g/dL — ABNORMAL LOW (ref 31.5–35.7)
MCV: 81 fL (ref 79–97)
Platelets: 381 10*3/uL — ABNORMAL HIGH (ref 150–379)
RBC: 4.27 x10E6/uL (ref 3.77–5.28)
RDW: 16.3 % — ABNORMAL HIGH (ref 12.3–15.4)
WBC: 8.7 10*3/uL (ref 3.4–10.8)

## 2018-02-06 LAB — LIPID PANEL
CHOL/HDL RATIO: 2.5 ratio (ref 0.0–4.4)
CHOLESTEROL TOTAL: 145 mg/dL (ref 100–199)
HDL: 57 mg/dL (ref >39)
LDL Calculated: 72 mg/dL (ref 0–99)
TRIGLYCERIDES: 81 mg/dL (ref 0–149)
VLDL Cholesterol Cal: 16 mg/dL (ref 5–40)

## 2018-02-06 MED ORDER — FERROUS SULFATE 325 (65 FE) MG PO TABS: 325.0000 mg | ORAL_TABLET | Freq: Every day | ORAL | 0 refills | Status: DC

## 2018-02-06 MED FILL — FERROUS SULFATE 325 MG TAB: 325 (65 FE) | 30 days supply | Qty: 30 | Fill #0

## 2018-02-06 NOTE — Telephone Encounter (Signed)
[place] ID# [id number] contacted pt to go over lab results pt didn't answer left a detailed vm informing pt of results and if she has any questions or concerns to give me a call  If pt calls back please give results: kidney and liver function tests are normal. Cholesterol is normal. She has a mild anemia. I recommend starting iron supplement. Prescription sent to [facility] pharmacy. Since she no longer has menstrual cycles she may be losing blood through the gastrointestinal tract. It is important that she complete and turn in the stool card test called the colon guard test that was given to her.

## 2018-02-11 LAB — FECAL OCCULT BLOOD, IMMUNOCHEMICAL: Fecal Occult Bld: NEGATIVE

## 2018-02-13 ENCOUNTER — Encounter (HOSPITAL_COMMUNITY): Payer: Self-pay | Admitting: *Deleted

## 2018-02-13 NOTE — Progress Notes (Signed)
Letter mailed to patient with negative Fit Test results.  

## 2018-02-16 ENCOUNTER — Ambulatory Visit: Payer: Self-pay | Attending: Internal Medicine

## 2018-03-01 MED FILL — LEVOTHYROXINE 50 MCG TABLET: 50 | 30 days supply | Qty: 30 | Fill #2

## 2018-03-06 MED FILL — metFORMIN HCL 1000 MG TABS: 1000 | 30 days supply | Qty: 60 | Fill #0

## 2018-03-06 MED FILL — FERROUS SULFATE 325 MG TAB: 325 (65 FE) | 30 days supply | Qty: 30 | Fill #1

## 2018-03-06 MED FILL — ATORVASTATIN 40 MG TABLET: 40 | 30 days supply | Qty: 30 | Fill #2

## 2018-04-06 MED FILL — LEVOTHYROXINE 50 MCG TABLET: 50 | 20 days supply | Qty: 30 | Fill #1

## 2018-04-06 MED FILL — metFORMIN HCL 1000 MG TABS: 1000 | 30 days supply | Qty: 60 | Fill #1

## 2018-04-06 MED FILL — ATORVASTATIN CALCIUM 40 MG: 40 | 30 days supply | Qty: 30 | Fill #0

## 2018-04-06 MED FILL — FERROUS SULFATE 325 MG TAB: 325 (65 FE) | 30 days supply | Qty: 30 | Fill #2

## 2018-05-14 MED FILL — ATORVASTATIN CALCIUM 40 MG: 40 | 30 days supply | Qty: 30 | Fill #1

## 2018-05-14 MED FILL — metFORMIN HCL 1000 MG TABS: 1000 | 30 days supply | Qty: 60 | Fill #2

## 2018-05-14 MED FILL — LEVOTHYROXINE 50 MCG TABLET: 50 | 20 days supply | Qty: 30 | Fill #2

## 2018-05-14 MED FILL — TRUE METRIX TEST STRIP: 50 days supply | Qty: 100 | Fill #2

## 2018-06-13 MED FILL — metFORMIN HCL 1000 MG TABS: 1000 | 30 days supply | Qty: 60 | Fill #3

## 2018-06-13 MED FILL — ATORVASTATIN CALCIUM 40 MG: 40 | 30 days supply | Qty: 30 | Fill #2

## 2018-06-13 MED FILL — LEVOTHYROXINE 50 MCG TABLET: 50 | 20 days supply | Qty: 30 | Fill #3

## 2018-06-18 ENCOUNTER — Ambulatory Visit: Payer: Self-pay | Attending: Internal Medicine | Admitting: Internal Medicine

## 2018-06-18 ENCOUNTER — Encounter: Payer: Self-pay | Admitting: Internal Medicine

## 2018-06-18 VITALS — BP 144/76 | HR 75 | Temp 98.2°F | Resp 16 | Wt 141.2 lb

## 2018-06-18 DIAGNOSIS — Z7982 Long term (current) use of aspirin: Secondary | ICD-10-CM | POA: Insufficient documentation

## 2018-06-18 DIAGNOSIS — Z79899 Other long term (current) drug therapy: Secondary | ICD-10-CM | POA: Insufficient documentation

## 2018-06-18 DIAGNOSIS — E039 Hypothyroidism, unspecified: Secondary | ICD-10-CM

## 2018-06-18 DIAGNOSIS — E785 Hyperlipidemia, unspecified: Secondary | ICD-10-CM

## 2018-06-18 DIAGNOSIS — Z7984 Long term (current) use of oral hypoglycemic drugs: Secondary | ICD-10-CM | POA: Insufficient documentation

## 2018-06-18 DIAGNOSIS — D649 Anemia, unspecified: Secondary | ICD-10-CM

## 2018-06-18 DIAGNOSIS — E119 Type 2 diabetes mellitus without complications: Secondary | ICD-10-CM

## 2018-06-18 LAB — GLUCOSE, POCT (MANUAL RESULT ENTRY): POC Glucose: 104 mg/dl — AB (ref 70–99)

## 2018-06-18 NOTE — Progress Notes (Signed)
Patient ID: Cynthia Wiley, female    DOB: 1966/03/21  MRN: [id number]  CC: Diabetes   Subjective: Cynthia Wiley is a 52 y.o. female who presents for chronic ds management.  Last seen in March of this year. Her concerns today include:  pt with hx of DM type 2, HL and hypothyroid  Anemia: Patient with mild anemia noted on blood test done in March.  I recommended taking iron supplement which she has been taking daily.  She did complete the fit test and that was negative.  We had plan to check iron level and recheck blood count today.  DM:  Checking BS every morning.  Gives range 98-123. Compliant with Metformin Exercise: walking daily  HL:  Compliant with and tolerating Lipitor  Hypothyroid: compliant levothyroxine.  Denies any palpitations or unexplained weight changes.  Patient Active Problem List   Diagnosis Date Noted  . Hyperlipidemia 08/07/2017  . DM2 (diabetes mellitus, type 2) ([place]) 01/22/2015  . Hypothyroidism 01/22/2015     Current Outpatient Medications on File Prior to Visit  Medication Sig Dispense Refill  . aspirin 81 MG tablet Take 1 tablet (81 mg total) by mouth daily. 30 tablet 11  . atorvastatin (LIPITOR) 40 MG tablet Take 1 tablet (40 mg total) by mouth daily. 30 tablet 11  . Blood Glucose Monitoring Suppl (TRUE METRIX METER) W/DEVICE KIT 1 each by Does not apply route as needed. 1 kit 0  . ferrous sulfate (FERROUSUL) 325 (65 FE) MG tablet Take 1 tablet (325 mg total) by mouth daily with breakfast. 90 tablet 0  . glucose blood (TRUE METRIX BLOOD GLUCOSE TEST) test strip 1 each by Other route 2 (two) times daily. 100 each 11  . levothyroxine (SYNTHROID, LEVOTHROID) 50 MCG tablet Take 1.5 tablets (75 mcg total) by mouth daily. 30 tablet 11  . metFORMIN (GLUCOPHAGE) 1000 MG tablet Take 1 tablet (1,000 mg total) by mouth 2 (two) times daily with a meal. 60 tablet 11  . TRUE METRIX BLOOD GLUCOSE TEST test strip USE 2 TEST BLOOD SUGARS 2 TIMES DAILY  100 each 11  . TRUEPLUS LANCETS 28G MISC 1 each by Does not apply route 2 (two) times daily. 100 each 11   No current facility-administered medications on file prior to visit.     Allergies  Allergen Reactions  . Onglyza [Saxagliptin] Nausea Only  . Sulfonylureas Other (See Comments)    Severe stomach ache with Amaryl and glipizide XL     Social History   Socioeconomic History  . Marital status: Single    Spouse name: Not on file  . Number of children: Not on file  . Years of education: Not on file  . Highest education level: Not on file  Occupational History  . Not on file  Social Needs  . Financial resource strain: Not on file  . Food insecurity:    Worry: Not on file    Inability: Not on file  . Transportation needs:    Medical: Not on file    Non-medical: Not on file  Tobacco Use  . Smoking status: Never Smoker  . Smokeless tobacco: Never Used  Substance and Sexual Activity  . Alcohol use: No  . Drug use: No  . Sexual activity: Yes    Birth control/protection: None  Lifestyle  . Physical activity:    Days per week: Patient refused    Minutes per session: Patient refused  . Stress: Not on file  Relationships  .  Social connections:    Talks on phone: Patient refused    Gets together: Patient refused    Attends religious service: Patient refused    Active member of club or organization: Patient refused    Attends meetings of clubs or organizations: Patient refused    Relationship status: Patient refused  . Intimate partner violence:    Fear of current or ex partner: Patient refused    Emotionally abused: Patient refused    Physically abused: Patient refused    Forced sexual activity: Patient refused  Other Topics Concern  . Not on file  Social History Narrative  . Not on file    Family History  Problem Relation Age of Onset  . Diabetes Father   . Heart disease Father   . Diabetes Sister   . Hyperlipidemia Brother     Past Surgical History:    Procedure Laterality Date  . ABDOMINAL HYSTERECTOMY N/A 01/31/2013   Procedure: HYSTERECTOMY ABDOMINAL;  Surgeon: Lavonia Drafts, MD;  Location: [facility];  Service: Gynecology;  Laterality: N/A;  . BILATERAL SALPINGECTOMY Bilateral 01/31/2013   Procedure: BILATERAL SALPINGECTOMY;  Surgeon: Lavonia Drafts, MD;  Location: [facility];  Service: Gynecology;  Laterality: Bilateral;  . CYSTOSCOPY WITH STENT PLACEMENT Bilateral 01/31/2013   Procedure: CYSTOSCOPY WITH STENT PLACEMENT;  Surgeon: Malka So, MD;  Location: [facility];  Service: Urology;  Laterality: Bilateral;  with c-arm    ROS: Review of Systems Negative except as stated above. PHYSICAL EXAM: BP (!) 144/76   Pulse 75   Temp 98.2 F (36.8 C) (Oral)   Resp 16   Wt 141 lb 3.2 oz (64 kg)   LMP 01/19/2013   SpO2 100%   BMI 25.83 kg/m   122/62 Physical Exam  General appearance - alert, well appearing, and in no distress Mental status - normal mood, behavior, speech, dress, motor activity, and thought processes Neck - supple, no significant adenopathy Chest - clear to auscultation, no wheezes, rales or rhonchi, symmetric air entry Heart - normal rate, regular rhythm, normal S1, S2, no murmurs, rubs, clicks or gallops Extremities - peripheral pulses normal, no pedal edema, no clubbing or cyanosis  Results for orders placed or performed in visit on 06/18/18  POCT glucose (manual entry)  Result Value Ref Range   POC Glucose 104 (A) 70 - 99 mg/dl   Lab Results  Component Value Date   WBC 8.7 02/05/2018   HGB 10.8 (L) 02/05/2018   HCT 34.4 02/05/2018   MCV 81 02/05/2018   PLT 381 (H) 02/05/2018     ASSESSMENT AND PLAN: 1. Type 2 diabetes mellitus without complication, without long-term current use of insulin ([place]) At goal based on blood sugar readings.  Continue metformin.  Continue healthy eating habits and regular exercise. - POCT glucose (manual entry) - Hemoglobin A1c - Microalbumin / creatinine urine  ratio  2. Anemia, unspecified type - CBC - Iron, TIBC and Ferritin Panel  3. Acquired hypothyroidism - TSH  4. Hyperlipidemia, unspecified hyperlipidemia type Continue Lipitor.    Patient was given the opportunity to ask questions.  Patient verbalized understanding of the plan and was able to repeat key elements of the plan.  [employer] interpreter used during this encounter.  Orders Placed This Encounter  Procedures  . Hemoglobin A1c  . Microalbumin / creatinine urine ratio  . CBC  . Iron, TIBC and Ferritin Panel  . TSH  . POCT glucose (manual entry)     Requested Prescriptions    No prescriptions  requested or ordered in this encounter    Return in about 4 months (around 10/19/2018).  Karle Plumber, MD, FACP

## 2018-06-19 LAB — CBC
Hematocrit: 36.6 % (ref 34.0–46.6)
Hemoglobin: 11.6 g/dL (ref 11.1–15.9)
MCH: 26.8 pg (ref 26.6–33.0)
MCHC: 31.7 g/dL (ref 31.5–35.7)
MCV: 85 fL (ref 79–97)
PLATELETS: 349 10*3/uL (ref 150–450)
RBC: 4.33 x10E6/uL (ref 3.77–5.28)
RDW: 15.5 % — AB (ref 12.3–15.4)
WBC: 8.2 10*3/uL (ref 3.4–10.8)

## 2018-06-19 LAB — IRON,TIBC AND FERRITIN PANEL
Ferritin: 18 ng/mL (ref 15–150)
IRON SATURATION: 15 % (ref 15–55)
Iron: 51 ug/dL (ref 27–159)
Total Iron Binding Capacity: 349 ug/dL (ref 250–450)
UIBC: 298 ug/dL (ref 131–425)

## 2018-06-19 LAB — TSH: TSH: 1.66 u[IU]/mL (ref 0.450–4.500)

## 2018-06-19 LAB — MICROALBUMIN / CREATININE URINE RATIO
Creatinine, Urine: 24.7 mg/dL
Microalb/Creat Ratio: <12.1 mg/g creat (ref 0.0–30.0)
Microalbumin, Urine: <3 ug/mL

## 2018-06-19 LAB — HEMOGLOBIN A1C
ESTIMATED AVERAGE GLUCOSE: 177 mg/dL
Hgb A1c MFr Bld: 7.8 % — ABNORMAL HIGH (ref 4.8–5.6)

## 2018-06-20 ENCOUNTER — Telehealth: Payer: Self-pay

## 2018-06-20 NOTE — Telephone Encounter (Signed)
[employer] interpreters [place]  Id# [phone] contacted pt to go over lab results pt didn't answer left a detailed vm informing pt of results and if she has any questions or concerns to give me a call   If pt calls back please give results: anemia has improved but she still needs to continue taking iron supplement. RF sent to our pharmacy. Her A1C is 7.8 with goal being less than 7. Continue Metformin, continue to work on eating habits and regular exercise.

## 2018-06-25 ENCOUNTER — Ambulatory Visit: Payer: Self-pay | Admitting: Internal Medicine

## 2018-06-25 ENCOUNTER — Other Ambulatory Visit: Payer: Self-pay | Admitting: Internal Medicine

## 2018-06-25 MED FILL — FERROUS SULFATE 325 MG TAB: 325 (65 FE) | 30 days supply | Qty: 30 | Fill #0

## 2018-07-20 MED FILL — FERROUS SULFATE 325 MG TAB: 325 (65 FE) | 30 days supply | Qty: 30 | Fill #1

## 2018-07-20 MED FILL — metFORMIN HCL 1000 MG TABS: 1000 | 30 days supply | Qty: 60 | Fill #4

## 2018-07-20 MED FILL — ATORVASTATIN CALCIUM 40 MG: 40 | 30 days supply | Qty: 30 | Fill #3

## 2018-07-20 MED FILL — LEVOTHYROXINE 50 MCG TABLET: 50 | 20 days supply | Qty: 30 | Fill #4

## 2018-08-16 MED FILL — FERROUS SULFATE 325 MG TAB: 325 (65 FE) | 30 days supply | Qty: 30 | Fill #2

## 2018-08-16 MED FILL — ATORVASTATIN CALCIUM 40 MG: 40 | 30 days supply | Qty: 30 | Fill #4

## 2018-08-16 MED FILL — metFORMIN HCL 1000 MG TABS: 1000 | 30 days supply | Qty: 60 | Fill #5

## 2018-08-16 MED FILL — LEVOTHYROXINE 50 MCG TABLET: 50 | 20 days supply | Qty: 30 | Fill #5

## 2018-09-17 MED FILL — metFORMIN HCL 1000 MG TABS: 1000 | 30 days supply | Qty: 60 | Fill #6

## 2018-09-17 MED FILL — ATORVASTATIN CALCIUM 40 MG: 40 | 30 days supply | Qty: 30 | Fill #5

## 2018-09-17 MED FILL — LEVOTHYROXINE 50 MCG TABLET: 50 | 20 days supply | Qty: 30 | Fill #6

## 2018-10-18 ENCOUNTER — Encounter: Payer: Self-pay | Admitting: Internal Medicine

## 2018-10-18 ENCOUNTER — Ambulatory Visit: Payer: Self-pay | Attending: Internal Medicine | Admitting: Internal Medicine

## 2018-10-18 VITALS — BP 127/72 | HR 79 | Temp 98.5°F | Resp 16 | Wt 139.2 lb

## 2018-10-18 DIAGNOSIS — E038 Other specified hypothyroidism: Secondary | ICD-10-CM | POA: Insufficient documentation

## 2018-10-18 DIAGNOSIS — E119 Type 2 diabetes mellitus without complications: Secondary | ICD-10-CM | POA: Insufficient documentation

## 2018-10-18 DIAGNOSIS — D649 Anemia, unspecified: Secondary | ICD-10-CM

## 2018-10-18 DIAGNOSIS — D509 Iron deficiency anemia, unspecified: Secondary | ICD-10-CM | POA: Insufficient documentation

## 2018-10-18 DIAGNOSIS — E039 Hypothyroidism, unspecified: Secondary | ICD-10-CM

## 2018-10-18 DIAGNOSIS — E785 Hyperlipidemia, unspecified: Secondary | ICD-10-CM | POA: Insufficient documentation

## 2018-10-18 DIAGNOSIS — Z79890 Hormone replacement therapy (postmenopausal): Secondary | ICD-10-CM | POA: Insufficient documentation

## 2018-10-18 DIAGNOSIS — Z8249 Family history of ischemic heart disease and other diseases of the circulatory system: Secondary | ICD-10-CM | POA: Insufficient documentation

## 2018-10-18 DIAGNOSIS — Z7982 Long term (current) use of aspirin: Secondary | ICD-10-CM | POA: Insufficient documentation

## 2018-10-18 DIAGNOSIS — Z7984 Long term (current) use of oral hypoglycemic drugs: Secondary | ICD-10-CM | POA: Insufficient documentation

## 2018-10-18 DIAGNOSIS — Z79899 Other long term (current) drug therapy: Secondary | ICD-10-CM | POA: Insufficient documentation

## 2018-10-18 LAB — GLUCOSE, POCT (MANUAL RESULT ENTRY): POC Glucose: 92 mg/dl (ref 70–99)

## 2018-10-18 MED ORDER — TRUE METRIX METER W/DEVICE KIT: 1.0000 | PACK | 0 refills | Status: DC | PRN

## 2018-10-18 MED ORDER — TRUEPLUS LANCETS 28G MISC: 1.0000 | Freq: Two times a day (BID) | 11 refills | Status: DC

## 2018-10-18 MED ORDER — GLUCOSE BLOOD VI STRP: ORAL_STRIP | 11 refills | Status: DC

## 2018-10-18 MED ORDER — FERROUS SULFATE 325 (65 FE) MG PO TABS: ORAL_TABLET | ORAL | 0 refills | Status: DC

## 2018-10-18 MED FILL — TRUEplus LANCETS 28G MISC: 30 days supply | Qty: 100 | Fill #0

## 2018-10-18 MED FILL — FERROUS SULFATE 325 MG TAB: 325 (65 FE) | 30 days supply | Qty: 30 | Fill #0

## 2018-10-18 MED FILL — !TRUE METRIX BLOOD GLUCOSE: 30 days supply | Qty: 1 | Fill #0

## 2018-10-18 MED FILL — TRUE METRIX TEST STRIP: 30 days supply | Qty: 100 | Fill #0

## 2018-10-18 NOTE — Progress Notes (Signed)
Patient ID: Cynthia Wiley, female    DOB: 1966-03-05  MRN: [id number]  CC: Diabetes   Subjective: Cynthia Wiley is a 52 y.o. female who presents for chronic disease management.  Last seen July 2019. Her concerns today include:  pt with hx of DM type 2, HL, iron def and hypothyroid  DM:  Glucometer no longer works.  Taking and tolerating Metformin Eating habits: doing well with eating habits.  Eats wheat bread.  Exercise:  Walking daily for 1- 1.5 hrs. No numbness in feet.   Thyroid:  Compliant with Levothyroxine  She did not get the VM message 06/2018 with lab results. I went over those results today.  The anemia had improved but she still needs to take iron supplement.  Her A1c at the time was 7.8.  TSH was normal. Patient Active Problem List   Diagnosis Date Noted  . Hyperlipidemia 08/07/2017  . DM2 (diabetes mellitus, type 2) ([place]) 01/22/2015  . Hypothyroidism 01/22/2015     Current Outpatient Medications on File Prior to Visit  Medication Sig Dispense Refill  . aspirin 81 MG tablet Take 1 tablet (81 mg total) by mouth daily. 30 tablet 11  . atorvastatin (LIPITOR) 40 MG tablet Take 1 tablet (40 mg total) by mouth daily. 30 tablet 11  . levothyroxine (SYNTHROID, LEVOTHROID) 50 MCG tablet Take 1.5 tablets (75 mcg total) by mouth daily. 30 tablet 11  . metFORMIN (GLUCOPHAGE) 1000 MG tablet Take 1 tablet (1,000 mg total) by mouth 2 (two) times daily with a meal. 60 tablet 11   No current facility-administered medications on file prior to visit.     Allergies  Allergen Reactions  . Onglyza [Saxagliptin] Nausea Only  . Sulfonylureas Other (See Comments)    Severe stomach ache with Amaryl and glipizide XL     Social History   Socioeconomic History  . Marital status: Single    Spouse name: Not on file  . Number of children: Not on file  . Years of education: Not on file  . Highest education level: Not on file  Occupational History  . Not on file    Social Needs  . Financial resource strain: Not on file  . Food insecurity:    Worry: Not on file    Inability: Not on file  . Transportation needs:    Medical: Not on file    Non-medical: Not on file  Tobacco Use  . Smoking status: Never Smoker  . Smokeless tobacco: Never Used  Substance and Sexual Activity  . Alcohol use: No  . Drug use: No  . Sexual activity: Yes    Birth control/protection: None  Lifestyle  . Physical activity:    Days per week: Patient refused    Minutes per session: Patient refused  . Stress: Not on file  Relationships  . Social connections:    Talks on phone: Patient refused    Gets together: Patient refused    Attends religious service: Patient refused    Active member of club or organization: Patient refused    Attends meetings of clubs or organizations: Patient refused    Relationship status: Patient refused  . Intimate partner violence:    Fear of current or ex partner: Patient refused    Emotionally abused: Patient refused    Physically abused: Patient refused    Forced sexual activity: Patient refused  Other Topics Concern  . Not on file  Social History Narrative  . Not on file  Family History  Problem Relation Age of Onset  . Diabetes Father   . Heart disease Father   . Diabetes Sister   . Hyperlipidemia Brother     Past Surgical History:  Procedure Laterality Date  . ABDOMINAL HYSTERECTOMY N/A 01/31/2013   Procedure: HYSTERECTOMY ABDOMINAL;  Surgeon: Lavonia Drafts, MD;  Location: [facility];  Service: Gynecology;  Laterality: N/A;  . BILATERAL SALPINGECTOMY Bilateral 01/31/2013   Procedure: BILATERAL SALPINGECTOMY;  Surgeon: Lavonia Drafts, MD;  Location: [facility];  Service: Gynecology;  Laterality: Bilateral;  . CYSTOSCOPY WITH STENT PLACEMENT Bilateral 01/31/2013   Procedure: CYSTOSCOPY WITH STENT PLACEMENT;  Surgeon: Malka So, MD;  Location: [facility];  Service: Urology;  Laterality: Bilateral;  with c-arm     ROS: Review of Systems Neg except as above  PHYSICAL EXAM: BP 127/72   Pulse 79   Temp 98.5 F (36.9 C) (Oral)   Resp 16   Wt 139 lb 3.2 oz (63.1 kg)   LMP 01/19/2013   SpO2 97%   BMI 25.46 kg/m   Physical Exam  General appearance - alert, well appearing, and in no distress Mental status - normal mood, behavior, speech, dress, motor activity, and thought processes Neck - supple, no significant adenopathy Chest - clear to auscultation, no wheezes, rales or rhonchi, symmetric air entry Heart - normal rate, regular rhythm, normal S1, S2, no murmurs, rubs, clicks or gallops Extremities - peripheral pulses normal, no pedal edema, no clubbing or cyanosis   BS 92  Lab Results  Component Value Date   WBC 8.2 06/18/2018   HGB 11.6 06/18/2018   HCT 36.6 06/18/2018   MCV 85 06/18/2018   PLT 349 06/18/2018   Lab Results  Component Value Date   IRON 51 06/18/2018   TIBC 349 06/18/2018   FERRITIN 18 06/18/2018     Chemistry      Component Value Date/Time   NA 141 02/05/2018 1017   K 4.7 02/05/2018 1017   CL 101 02/05/2018 1017   CO2 23 02/05/2018 1017   BUN 12 02/05/2018 1017   CREATININE 0.75 02/05/2018 1017   CREATININE 0.61 02/07/2017 1626      Component Value Date/Time   CALCIUM 9.6 02/05/2018 1017   ALKPHOS 99 02/05/2018 1017   AST 14 02/05/2018 1017   ALT 12 02/05/2018 1017   BILITOT 0.4 02/05/2018 1017     Assessment/plan 1. Type 2 diabetes mellitus without complication, without long-term current use of insulin [facility]) Prescription sent to the pharmacy for diabetic testing supplies.  Check A1c today.  Commended her on healthy eating habits and regular exercise.  Encouraged her to keep up the good work. - POCT glucose (manual entry) - Hemoglobin A1c - glucose blood (TRUE METRIX BLOOD GLUCOSE TEST) test strip; Use as instructed  Dispense: 100 each; Refill: 11 - TRUEPLUS LANCETS 28G MISC; 1 each by Does not apply route 2 (two) times daily.  Dispense: 100  each; Refill: 11 - Blood Glucose Monitoring Suppl (TRUE METRIX METER) w/Device KIT; 1 each by Does not apply route as needed.  Dispense: 1 kit; Refill: 0  2. Acquired hypothyroidism Continue levothyroxine.  3. Anemia, unspecified type - ferrous sulfate (FEROSUL) 325 (65 FE) MG tablet; TAKE 1 TABLET (325 MG TOTAL) BY MOUTH DAILY WITH BREAKFAST.  Dispense: 90 tablet; Refill: 0 - CBC  Patient was given the opportunity to ask questions.  Patient verbalized understanding of the plan and was able to repeat key elements of the plan.  [employer] interpreter  used during this encounter.  Orders Placed This Encounter  Procedures  . Hemoglobin A1c  . CBC  . POCT glucose (manual entry)     Requested Prescriptions   Signed Prescriptions Disp Refills  . ferrous sulfate (FEROSUL) 325 (65 FE) MG tablet 90 tablet 0    Sig: TAKE 1 TABLET (325 MG TOTAL) BY MOUTH DAILY WITH BREAKFAST.  Marland Kitchen glucose blood (TRUE METRIX BLOOD GLUCOSE TEST) test strip 100 each 11    Sig: Use as instructed  . TRUEPLUS LANCETS 28G MISC 100 each 11    Sig: 1 each by Does not apply route 2 (two) times daily.  . Blood Glucose Monitoring Suppl (TRUE METRIX METER) w/Device KIT 1 kit 0    Sig: 1 each by Does not apply route as needed.    Return in about 4 months (around 02/16/2019).  Karle Plumber, MD, FACP

## 2018-10-19 ENCOUNTER — Ambulatory Visit: Payer: Self-pay | Admitting: Internal Medicine

## 2018-10-19 LAB — CBC
HEMATOCRIT: 37.7 % (ref 34.0–46.6)
Hemoglobin: 12.2 g/dL (ref 11.1–15.9)
MCH: 27.5 pg (ref 26.6–33.0)
MCHC: 32.4 g/dL (ref 31.5–35.7)
MCV: 85 fL (ref 79–97)
Platelets: 348 10*3/uL (ref 150–450)
RBC: 4.43 x10E6/uL (ref 3.77–5.28)
RDW: 13.1 % (ref 12.3–15.4)
WBC: 8 10*3/uL (ref 3.4–10.8)

## 2018-10-19 LAB — HEMOGLOBIN A1C
ESTIMATED AVERAGE GLUCOSE: 177 mg/dL
HEMOGLOBIN A1C: 7.8 % — AB (ref 4.8–5.6)

## 2018-10-24 MED FILL — metFORMIN HCL 1000 MG TABS: 1000 | 30 days supply | Qty: 60 | Fill #7

## 2018-10-24 MED FILL — LEVOTHYROXINE 50 MCG TABLET: 50 | 20 days supply | Qty: 30 | Fill #7

## 2018-10-24 MED FILL — ATORVASTATIN CALCIUM 40 MG: 40 | 30 days supply | Qty: 30 | Fill #6

## 2018-10-25 ENCOUNTER — Telehealth: Payer: Self-pay

## 2018-10-25 NOTE — Telephone Encounter (Signed)
Cynthia Wiley Cynthia Wiley  Id# [phone]  contacted pt to go over lab results pt didn't answer left a detailed vm informing pt of results   If pt calls back please give results: A1c which is the three-month level for diabetes is 7.8. The goal is to be less than 7. Continue metformin. Continue to work on improving healthy eating habits.

## 2018-11-15 MED FILL — LEVOTHYROXINE 50 MCG TABLET: 50 | 20 days supply | Qty: 30 | Fill #8

## 2018-11-15 MED FILL — ATORVASTATIN CALCIUM 40 MG: 40 | 30 days supply | Qty: 30 | Fill #7

## 2018-11-15 MED FILL — metFORMIN HCL 1000 MG TABS: 1000 | 30 days supply | Qty: 60 | Fill #8

## 2018-11-15 MED FILL — FERROUS SULFATE 325 MG TAB: 325 (65 FE) | 30 days supply | Qty: 30 | Fill #1

## 2018-12-19 MED FILL — FERROUS SULFATE 325 MG TAB: 325 (65 FE) | 30 days supply | Qty: 30 | Fill #2

## 2018-12-19 MED FILL — ATORVASTATIN CALCIUM 40 MG: 40 | 30 days supply | Qty: 30 | Fill #8

## 2018-12-19 MED FILL — metFORMIN HCL 1000 MG TABS: 1000 | 30 days supply | Qty: 60 | Fill #9

## 2018-12-19 MED FILL — LEVOTHYROXINE 50 MCG TABLET: 50 | 20 days supply | Qty: 30 | Fill #9

## 2019-01-21 MED FILL — metFORMIN HCL 1000 MG TABS: 1000 | 30 days supply | Qty: 60 | Fill #10

## 2019-01-21 MED FILL — LEVOTHYROXINE 50 MCG TABLET: 50 | 20 days supply | Qty: 30 | Fill #10

## 2019-01-21 MED FILL — ATORVASTATIN CALCIUM 40 MG: 40 | 30 days supply | Qty: 30 | Fill #9

## 2019-02-14 ENCOUNTER — Ambulatory Visit: Payer: Self-pay | Attending: Internal Medicine | Admitting: Internal Medicine

## 2019-02-14 ENCOUNTER — Other Ambulatory Visit: Payer: Self-pay

## 2019-02-14 VITALS — BP 128/75 | HR 75 | Temp 98.4°F | Resp 16 | Wt 129.0 lb

## 2019-02-14 DIAGNOSIS — Z1211 Encounter for screening for malignant neoplasm of colon: Secondary | ICD-10-CM

## 2019-02-14 DIAGNOSIS — D649 Anemia, unspecified: Secondary | ICD-10-CM

## 2019-02-14 DIAGNOSIS — E119 Type 2 diabetes mellitus without complications: Secondary | ICD-10-CM

## 2019-02-14 DIAGNOSIS — E039 Hypothyroidism, unspecified: Secondary | ICD-10-CM

## 2019-02-14 DIAGNOSIS — E785 Hyperlipidemia, unspecified: Secondary | ICD-10-CM

## 2019-02-14 LAB — GLUCOSE, POCT (MANUAL RESULT ENTRY): POC Glucose: 89 mg/dl (ref 70–99)

## 2019-02-14 LAB — POCT GLYCOSYLATED HEMOGLOBIN (HGB A1C): HbA1c, POC (controlled diabetic range): 7.5 % — AB (ref 0.0–7.0)

## 2019-02-14 MED ORDER — METFORMIN HCL 1000 MG PO TABS: 1000.0000 mg | ORAL_TABLET | Freq: Two times a day (BID) | ORAL | 11 refills | Status: DC

## 2019-02-14 MED ORDER — LEVOTHYROXINE SODIUM 50 MCG PO TABS: 75.0000 ug | ORAL_TABLET | Freq: Every day | ORAL | 11 refills | Status: DC

## 2019-02-14 MED ORDER — FERROUS SULFATE 325 (65 FE) MG PO TABS: ORAL_TABLET | ORAL | 0 refills | Status: DC

## 2019-02-14 MED ORDER — ATORVASTATIN CALCIUM 40 MG PO TABS: 40.0000 mg | ORAL_TABLET | Freq: Every day | ORAL | 11 refills | Status: DC

## 2019-02-14 MED FILL — ATORVASTATIN CALCIUM 40 MG: 40 | 30 days supply | Qty: 30 | Fill #0

## 2019-02-14 MED FILL — LEVOTHYROXINE 50 MCG TABLET: 50 | 20 days supply | Qty: 30 | Fill #0

## 2019-02-14 MED FILL — metFORMIN HCL 1000 MG TABS: 1000 | 30 days supply | Qty: 60 | Fill #0

## 2019-02-14 MED FILL — FERROUS SULFATE 325 MG TAB: 325 (65 FE) | 30 days supply | Qty: 30 | Fill #0

## 2019-02-14 NOTE — Progress Notes (Signed)
Patient ID: Cynthia Wiley, female    DOB: 1966-02-06  MRN: [id number]  CC: Diabetes   Subjective: Cynthia Wiley is a 53 y.o. female who presents for chronic ds management Her concerns today include:  pt with hx of DM type 2, HL, iron def and hypothyroid  DM:  Checking BS BID.  Gives range of 99-125 before BF and bedtime 117-135 Compliant with Metformin Walking daily Denies blurred vision.  No numbness in the feet. A1c has improved from 7.8 on last visit now to 7.5.  Hypothyroid:  Compliant with Levothyroxine  Anemia:  Out of iron supplement x 1 mth.  Patient has been postmenopausal for about 5 years.  HL: Reports compliance with atorvastatin. Patient Active Problem List   Diagnosis Date Noted  . Hyperlipidemia 08/07/2017  . DM2 (diabetes mellitus, type 2) ([place]) 01/22/2015  . Hypothyroidism 01/22/2015     Current Outpatient Medications on File Prior to Visit  Medication Sig Dispense Refill  . aspirin 81 MG tablet Take 1 tablet (81 mg total) by mouth daily. 30 tablet 11  . atorvastatin (LIPITOR) 40 MG tablet Take 1 tablet (40 mg total) by mouth daily. 30 tablet 11  . Blood Glucose Monitoring Suppl (TRUE METRIX METER) w/Device KIT 1 each by Does not apply route as needed. 1 kit 0  . ferrous sulfate (FEROSUL) 325 (65 FE) MG tablet TAKE 1 TABLET (325 MG TOTAL) BY MOUTH DAILY WITH BREAKFAST. 90 tablet 0  . glucose blood (TRUE METRIX BLOOD GLUCOSE TEST) test strip Use as instructed 100 each 11  . levothyroxine (SYNTHROID, LEVOTHROID) 50 MCG tablet Take 1.5 tablets (75 mcg total) by mouth daily. 30 tablet 11  . metFORMIN (GLUCOPHAGE) 1000 MG tablet Take 1 tablet (1,000 mg total) by mouth 2 (two) times daily with a meal. 60 tablet 11  . TRUEPLUS LANCETS 28G MISC 1 each by Does not apply route 2 (two) times daily. 100 each 11   No current facility-administered medications on file prior to visit.     Allergies  Allergen Reactions  . Onglyza [Saxagliptin] Nausea  Only  . Sulfonylureas Other (See Comments)    Severe stomach ache with Amaryl and glipizide XL     Social History   Socioeconomic History  . Marital status: Single    Spouse name: Not on file  . Number of children: Not on file  . Years of education: Not on file  . Highest education level: Not on file  Occupational History  . Not on file  Social Needs  . Financial resource strain: Not on file  . Food insecurity:    Worry: Not on file    Inability: Not on file  . Transportation needs:    Medical: Not on file    Non-medical: Not on file  Tobacco Use  . Smoking status: Never Smoker  . Smokeless tobacco: Never Used  Substance and Sexual Activity  . Alcohol use: No  . Drug use: No  . Sexual activity: Yes    Birth control/protection: None  Lifestyle  . Physical activity:    Days per week: Patient refused    Minutes per session: Patient refused  . Stress: Not on file  Relationships  . Social connections:    Talks on phone: Patient refused    Gets together: Patient refused    Attends religious service: Patient refused    Active member of club or organization: Patient refused    Attends meetings of clubs or organizations: Patient refused  Relationship status: Patient refused  . Intimate partner violence:    Fear of current or ex partner: Patient refused    Emotionally abused: Patient refused    Physically abused: Patient refused    Forced sexual activity: Patient refused  Other Topics Concern  . Not on file  Social History Narrative  . Not on file    Family History  Problem Relation Age of Onset  . Diabetes Father   . Heart disease Father   . Diabetes Sister   . Hyperlipidemia Brother     Past Surgical History:  Procedure Laterality Date  . ABDOMINAL HYSTERECTOMY N/A 01/31/2013   Procedure: HYSTERECTOMY ABDOMINAL;  Surgeon: Lavonia Drafts, MD;  Location: [facility];  Service: Gynecology;  Laterality: N/A;  . BILATERAL SALPINGECTOMY Bilateral 01/31/2013    Procedure: BILATERAL SALPINGECTOMY;  Surgeon: Lavonia Drafts, MD;  Location: [facility];  Service: Gynecology;  Laterality: Bilateral;  . CYSTOSCOPY WITH STENT PLACEMENT Bilateral 01/31/2013   Procedure: CYSTOSCOPY WITH STENT PLACEMENT;  Surgeon: Malka So, MD;  Location: [facility];  Service: Urology;  Laterality: Bilateral;  with c-arm    ROS: Review of Systems Negative except as stated above  PHYSICAL EXAM: BP 128/75   Pulse 75   Temp 98.4 F (36.9 C) (Oral)   Resp 16   Wt 129 lb (58.5 kg)   LMP 01/19/2013   SpO2 97%   BMI 23.59 kg/m   Physical Exam  General appearance - alert, well appearing, and in no distress Mental status - normal mood, behavior, speech, dress, motor activity, and thought processes Mouth - mucous membranes moist, pharynx normal without lesions  Ears: Both ear canals and tympanic membranes within normal limits. Neck - supple, no significant adenopathy Chest - clear to auscultation, no wheezes, rales or rhonchi, symmetric air entry Heart - normal rate, regular rhythm, normal S1, S2, no murmurs, rubs, clicks or gallops Extremities - peripheral pulses normal, no pedal edema, no clubbing or cyanosis  Results for orders placed or performed in visit on 02/14/19  POCT glucose (manual entry)  Result Value Ref Range   POC Glucose 89 70 - 99 mg/dl  POCT glycosylated hemoglobin (Hb A1C)  Result Value Ref Range   Hemoglobin A1C     HbA1c POC (<> result, manual entry)     HbA1c, POC (prediabetic range)     HbA1c, POC (controlled diabetic range) 7.5 (A) 0.0 - 7.0 %    CMP Latest Ref Rng & Units 02/05/2018 02/07/2017 01/22/2015  Glucose 65 - 99 mg/dL 90 113(H) 236(H)  BUN 6 - 24 mg/dL '12 8 12  ' Creatinine 0.57 - 1.00 mg/dL 0.75 0.61 0.74  Sodium 134 - 144 mmol/L 141 138 134(L)  Potassium 3.5 - 5.2 mmol/L 4.7 4.9 5.1  Chloride 96 - 106 mmol/L 101 101 100  CO2 20 - 29 mmol/L '23 24 26  ' Calcium 8.7 - 10.2 mg/dL 9.6 9.6 10.1  Total Protein 6.0 - 8.5 g/dL 7.5 - 7.3   Total Bilirubin 0.0 - 1.2 mg/dL 0.4 - 0.5  Alkaline Phos 39 - 117 IU/L 99 - 112  AST 0 - 40 IU/L 14 - 12  ALT 0 - 32 IU/L 12 - 12   Lipid Panel     Component Value Date/Time   CHOL 145 02/05/2018 1017   TRIG 81 02/05/2018 1017   HDL 57 02/05/2018 1017   CHOLHDL 2.5 02/05/2018 1017   CHOLHDL 2.7 02/07/2017 1626   VLDL 17 02/07/2017 1626   LDLCALC 72 02/05/2018 1017  CBC    Component Value Date/Time   WBC 8.0 10/18/2018 1630   WBC 7.1 01/22/2015 1047   RBC 4.43 10/18/2018 1630   RBC 4.73 01/22/2015 1047   HGB 12.2 10/18/2018 1630   HCT 37.7 10/18/2018 1630   PLT 348 10/18/2018 1630   MCV 85 10/18/2018 1630   MCH 27.5 10/18/2018 1630   MCH 26.8 01/22/2015 1047   MCHC 32.4 10/18/2018 1630   MCHC 32.0 01/22/2015 1047   RDW 13.1 10/18/2018 1630   LYMPHSABS 2.4 01/06/2009 2230   MONOABS 0.6 01/06/2009 2230   EOSABS 0.1 01/06/2009 2230   BASOSABS 0.0 01/06/2009 2230    ASSESSMENT AND PLAN:  1. Type 2 diabetes mellitus without complication, without long-term current use of insulin (HCC) Close to goal.  She will continue metformin, healthy eating habits and regular exercise. - POCT glucose (manual entry) - POCT glycosylated hemoglobin (Hb A1C) - CBC - Comprehensive metabolic panel - Lipid panel - metFORMIN (GLUCOPHAGE) 1000 MG tablet; Take 1 tablet (1,000 mg total) by mouth 2 (two) times daily with a meal.  Dispense: 60 tablet; Refill: 11  2. Acquired hypothyroidism - TSH - levothyroxine (SYNTHROID, LEVOTHROID) 50 MCG tablet; Take 1.5 tablets (75 mcg total) by mouth daily.  Dispense: 30 tablet; Refill: 11  3. Hyperlipidemia, unspecified hyperlipidemia type - atorvastatin (LIPITOR) 40 MG tablet; Take 1 tablet (40 mg total) by mouth daily.  Dispense: 30 tablet; Refill: 11  4. Iron deficiency anemia, unspecified iron deficiency anemia type Check CBC today. - ferrous sulfate (FEROSUL) 325 (65 FE) MG tablet; TAKE 1 TABLET (325 MG TOTAL) BY MOUTH DAILY WITH BREAKFAST.   Dispense: 90 tablet; Refill: 0  6. Screening for colon cancer She is due for fit test again. - Fecal occult blood, imunochemical([facility])   Patient was given the opportunity to ask questions.  Patient verbalized understanding of the plan and was able to repeat key elements of the plan.  .[employer] interpreter used during this encounter.  [id number]  Orders Placed This Encounter  Procedures  . POCT glucose (manual entry)  . POCT glycosylated hemoglobin (Hb A1C)     Requested Prescriptions    No prescriptions requested or ordered in this encounter    No follow-ups on file.  Karle Plumber, MD, FACP

## 2019-02-15 LAB — CBC
Hematocrit: 37.1 % (ref 34.0–46.6)
Hemoglobin: 12 g/dL (ref 11.1–15.9)
MCH: 27.5 pg (ref 26.6–33.0)
MCHC: 32.3 g/dL (ref 31.5–35.7)
MCV: 85 fL (ref 79–97)
PLATELETS: 336 10*3/uL (ref 150–450)
RBC: 4.36 x10E6/uL (ref 3.77–5.28)
RDW: 13 % (ref 11.7–15.4)
WBC: 8.4 10*3/uL (ref 3.4–10.8)

## 2019-02-15 LAB — COMPREHENSIVE METABOLIC PANEL
ALT: 17 IU/L (ref 0–32)
AST: 23 IU/L (ref 0–40)
Albumin/Globulin Ratio: 1.4 (ref 1.2–2.2)
Albumin: 4.2 g/dL (ref 3.8–4.9)
Alkaline Phosphatase: 85 IU/L (ref 39–117)
BUN/Creatinine Ratio: 14 (ref 9–23)
BUN: 11 mg/dL (ref 6–24)
Bilirubin Total: 0.5 mg/dL (ref 0.0–1.2)
CALCIUM: 9.9 mg/dL (ref 8.7–10.2)
CO2: 22 mmol/L (ref 20–29)
Chloride: 102 mmol/L (ref 96–106)
Creatinine, Ser: 0.79 mg/dL (ref 0.57–1.00)
GFR, EST AFRICAN AMERICAN: 100 mL/min/{1.73_m2} (ref >59)
GFR, EST NON AFRICAN AMERICAN: 86 mL/min/{1.73_m2} (ref >59)
GLUCOSE: 81 mg/dL (ref 65–99)
Globulin, Total: 2.9 g/dL (ref 1.5–4.5)
Potassium: 4.6 mmol/L (ref 3.5–5.2)
Sodium: 140 mmol/L (ref 134–144)
TOTAL PROTEIN: 7.1 g/dL (ref 6.0–8.5)

## 2019-02-15 LAB — LIPID PANEL
Chol/HDL Ratio: 2.7 ratio (ref 0.0–4.4)
Cholesterol, Total: 128 mg/dL (ref 100–199)
HDL: 47 mg/dL (ref >39)
LDL Calculated: 63 mg/dL (ref 0–99)
TRIGLYCERIDES: 88 mg/dL (ref 0–149)
VLDL CHOLESTEROL CAL: 18 mg/dL (ref 5–40)

## 2019-02-15 LAB — TSH: TSH: 2.12 u[IU]/mL (ref 0.450–4.500)

## 2019-02-22 LAB — FECAL OCCULT BLOOD, IMMUNOCHEMICAL: Fecal Occult Bld: NEGATIVE

## 2019-02-25 ENCOUNTER — Telehealth: Payer: Self-pay

## 2019-02-25 NOTE — Telephone Encounter (Signed)
Cynthia Wiley  Id# [id number]  contacted pt to go over lab results pt didn't answer left a detailed vm informing pt of results and if she has any questions or concerns to give me a call

## 2019-03-18 MED FILL — LEVOTHYROXINE 50 MCG TABLET: 50 | 20 days supply | Qty: 30 | Fill #1

## 2019-03-18 MED FILL — metFORMIN HCL 1000 MG TABS: 1000 | 30 days supply | Qty: 60 | Fill #1

## 2019-03-18 MED FILL — ATORVASTATIN CALCIUM 40 MG: 40 | 30 days supply | Qty: 30 | Fill #1

## 2019-03-18 MED FILL — FERROUS SULFATE 325 MG TAB: 325 (65 FE) | 30 days supply | Qty: 30 | Fill #1

## 2019-04-16 MED FILL — ATORVASTATIN CALCIUM 40 MG: 40 | 30 days supply | Qty: 30 | Fill #2

## 2019-04-16 MED FILL — LEVOTHYROXINE 50 MCG TABLET: 50 | 30 days supply | Qty: 45 | Fill #2

## 2019-04-16 MED FILL — metFORMIN HCL 1000 MG TABS: 1000 | 30 days supply | Qty: 60 | Fill #2

## 2019-04-16 MED FILL — FERROUS SULFATE 325 MG TAB: 325 (65 FE) | 30 days supply | Qty: 30 | Fill #2

## 2019-05-22 MED FILL — ?ATORVASTATIN 40MG TABLET: 40 | 30 days supply | Qty: 30 | Fill #3

## 2019-05-22 MED FILL — ?LEVOTHYROXINE 50 MCG TABLE: 50 | 30 days supply | Qty: 45 | Fill #3

## 2019-05-22 MED FILL — metFORMIN HCL 1000 MG TABS: 1000 | 30 days supply | Qty: 60 | Fill #3

## 2019-05-23 ENCOUNTER — Encounter: Payer: Self-pay | Admitting: Internal Medicine

## 2019-05-23 ENCOUNTER — Ambulatory Visit: Payer: Self-pay | Attending: Internal Medicine | Admitting: Internal Medicine

## 2019-05-23 ENCOUNTER — Other Ambulatory Visit: Payer: Self-pay

## 2019-05-23 DIAGNOSIS — D649 Anemia, unspecified: Secondary | ICD-10-CM

## 2019-05-23 DIAGNOSIS — E119 Type 2 diabetes mellitus without complications: Secondary | ICD-10-CM

## 2019-05-23 DIAGNOSIS — E039 Hypothyroidism, unspecified: Secondary | ICD-10-CM

## 2019-05-23 DIAGNOSIS — E785 Hyperlipidemia, unspecified: Secondary | ICD-10-CM

## 2019-05-23 NOTE — Progress Notes (Addendum)
Virtual Visit via Telephone Note Due to current restrictions/limitations of in-office visits due to the COVID-19 pandemic, this scheduled clinical appointment was converted to a telehealth visit  I connected with Cynthia Wiley on 05/23/19 at 4:00 p.m EDT by telephone and verified that I am speaking with the correct person using two identifiers. I am in my office.  The patient is at home.   the patient, myself and Verdis Frederickson from [employer] [phone]) participated in this encounter.  I discussed the limitations, risks, security and privacy concerns of performing an evaluation and management service by telephone and the availability of in person appointments. I also discussed with the patient that there may be a patient responsible charge related to this service. The patient expressed understanding and agreed to proceed.  History of Present Illness: pt with hx of DM type 2, HL, iron defand hypothyroid.  Pt last seen in 02/2019.  DM:  Checks BS daily in mornings.  Range 105-135.  Eating habits:  "I try to control the sugar drinks and eat a lot of vegetables. I've been trying to eat healthier."  Exercise:  Walking daily for exercise Med:  Compliant with Metformin. No blurred vision.  Over due for eye exam; she plans to get it done before the end of the yr  Hypothyroid:  Last TSH 3 mths ago was nl. Compliant with Levothyroxine No palpitations or unexplained wgh loss  HL:  Tolerating Lipitor and compliant with taking daily  Anemia;  CBC good on last visit.  Told to cut back on taking iron 3 x a wk and she has been doing that   Outpatient Encounter Medications as of 05/23/2019  Medication Sig  . aspirin 81 MG tablet Take 1 tablet (81 mg total) by mouth daily.  Marland Kitchen atorvastatin (LIPITOR) 40 MG tablet Take 1 tablet (40 mg total) by mouth daily.  . Blood Glucose Monitoring Suppl (TRUE METRIX METER) w/Device KIT 1 each by Does not apply route as needed.  . ferrous sulfate (FEROSUL)  325 (65 FE) MG tablet TAKE 1 TABLET (325 MG TOTAL) BY MOUTH DAILY WITH BREAKFAST.  Marland Kitchen glucose blood (TRUE METRIX BLOOD GLUCOSE TEST) test strip Use as instructed  . levothyroxine (SYNTHROID, LEVOTHROID) 50 MCG tablet Take 1.5 tablets (75 mcg total) by mouth daily.  . metFORMIN (GLUCOPHAGE) 1000 MG tablet Take 1 tablet (1,000 mg total) by mouth 2 (two) times daily with a meal.  . TRUEPLUS LANCETS 28G MISC 1 each by Does not apply route 2 (two) times daily.   No facility-administered encounter medications on file as of 05/23/2019.     Observations/Objective:  Results for orders placed or performed in visit on 02/14/19  Fecal occult blood, imunochemical([facility])   Specimen: Blood   BLD  Result Value Ref Range   Fecal Occult Bld Negative Negative  CBC  Result Value Ref Range   WBC 8.4 3.4 - 10.8 x10E3/uL   RBC 4.36 3.77 - 5.28 x10E6/uL   Hemoglobin 12.0 11.1 - 15.9 g/dL   Hematocrit 37.1 34.0 - 46.6 %   MCV 85 79 - 97 fL   MCH 27.5 26.6 - 33.0 pg   MCHC 32.3 31.5 - 35.7 g/dL   RDW 13.0 11.7 - 15.4 %   Platelets 336 150 - 450 x10E3/uL  Comprehensive metabolic panel  Result Value Ref Range   Glucose 81 65 - 99 mg/dL   BUN 11 6 - 24 mg/dL   Creatinine, Ser 0.79 0.57 - 1.00 mg/dL   GFR calc  non Af Amer 86 >59 mL/min/1.73   GFR calc Af Amer 100 >59 mL/min/1.73   BUN/Creatinine Ratio 14 9 - 23   Sodium 140 134 - 144 mmol/L   Potassium 4.6 3.5 - 5.2 mmol/L   Chloride 102 96 - 106 mmol/L   CO2 22 20 - 29 mmol/L   Calcium 9.9 8.7 - 10.2 mg/dL   Total Protein 7.1 6.0 - 8.5 g/dL   Albumin 4.2 3.8 - 4.9 g/dL   Globulin, Total 2.9 1.5 - 4.5 g/dL   Albumin/Globulin Ratio 1.4 1.2 - 2.2   Bilirubin Total 0.5 0.0 - 1.2 mg/dL   Alkaline Phosphatase 85 39 - 117 IU/L   AST 23 0 - 40 IU/L   ALT 17 0 - 32 IU/L  Lipid panel  Result Value Ref Range   Cholesterol, Total 128 100 - 199 mg/dL   Triglycerides 88 0 - 149 mg/dL   HDL 47 >39 mg/dL   VLDL Cholesterol Cal 18 5 - 40 mg/dL    LDL Calculated 63 0 - 99 mg/dL   Chol/HDL Ratio 2.7 0.0 - 4.4 ratio  TSH  Result Value Ref Range   TSH 2.120 0.450 - 4.500 uIU/mL  POCT glucose (manual entry)  Result Value Ref Range   POC Glucose 89 70 - 99 mg/dl  POCT glycosylated hemoglobin (Hb A1C)  Result Value Ref Range   Hemoglobin A1C     HbA1c POC (<> result, manual entry)     HbA1c, POC (prediabetic range)     HbA1c, POC (controlled diabetic range) 7.5 (A) 0.0 - 7.0 %    Assessment and Plan: 1. Type 2 diabetes mellitus without complication, without long-term current use of insulin (HCC) -reported BS at goal.  Continue Metformin, healthy eating habits and regular exercise -she will look into cost of having eye exam done before end of yr  2. Hyperlipidemia, unspecified hyperlipidemia type Continue Lipitor  3. Acquired hypothyroidism Continue current dose of Levothyroxine  4. Anemia, unspecified type I think we can stop iron. Plan to recheck CBC on next in person visit in 3 mths.    Follow Up Instructions: F/u in 3 mths   I discussed the assessment and treatment plan with the patient. The patient was provided an opportunity to ask questions and all were answered. The patient agreed with the plan and demonstrated an understanding of the instructions.   The patient was advised to call back or seek an in-person evaluation if the symptoms worsen or if the condition fails to improve as anticipated.  I provided 12 minutes of non-face-to-face time during this encounter.   Karle Plumber, MD

## 2019-06-03 MED FILL — TRUE METRIX TEST STRIP: 30 days supply | Qty: 100 | Fill #1

## 2019-06-06 MED FILL — TRUEplus LANCETS 28G MISC: 30 days supply | Qty: 100 | Fill #1

## 2019-06-27 MED FILL — ?LEVOTHYROXINE 50 MCG TABLE: 50 | 30 days supply | Qty: 45 | Fill #4

## 2019-06-27 MED FILL — ?ATORVASTATIN 40MG TABLET: 40 | 30 days supply | Qty: 30 | Fill #4

## 2019-06-27 MED FILL — metFORMIN HCL 1000 MG TABS: 1000 | 30 days supply | Qty: 60 | Fill #4

## 2019-07-16 ENCOUNTER — Telehealth: Payer: Self-pay | Admitting: Internal Medicine

## 2019-07-16 NOTE — Telephone Encounter (Signed)
Patient called to report that her atorvastatin without calcium is causing her issues. Patient states she feels a lot of pain in her hands and feet. Patient would like to know if she can be prescribed the previous version of atorvastatin with calcium. Please follow up.    Patient was advised to go to the ED or UC if needing to be seen sooner or symptoms worsen.

## 2019-07-16 NOTE — Telephone Encounter (Signed)
Will forward to pcp

## 2019-07-17 NOTE — Telephone Encounter (Signed)
Please call pt and schedule an appointment

## 2019-07-17 NOTE — Telephone Encounter (Signed)
Dr. Wynetta Emery -   The calcium is the salt form in the generic atorvastatin. It exists in all generics we cary. I had Elmyra Ricks in pharmacy verify this as I am not aware of any atorvastatin preparation lacking the calcium salt form.   I think this patient's question is d/t to the labeling. We have different stocks in our pharmacy. The patient's label on her rx bottle did not have the calcium listed. It read "atorvastatin" instead of "atorvastatin calcium". Even though the label reads "atorvastatin", the medication came from a stock bottle of atorvastatin calcium. The pain in her hands and feet are not d/t to this.

## 2019-07-18 ENCOUNTER — Telehealth: Payer: Self-pay | Admitting: Internal Medicine

## 2019-07-18 NOTE — Telephone Encounter (Signed)
Faythe Ghee will make pcp aware

## 2019-07-18 NOTE — Telephone Encounter (Signed)
Spoke with pt regarding appt for hands and feet eval, pt stated she rather have her hands and feet eval on her 3 month f/u appt on 10/2

## 2019-07-22 MED FILL — ?LEVOTHYROXINE 50 MCG TABLE: 50 | 30 days supply | Qty: 45 | Fill #5

## 2019-07-22 MED FILL — ATORVASTATIN CALCIUM 40 MG: 40 | 30 days supply | Qty: 30 | Fill #5

## 2019-07-22 MED FILL — metFORMIN HCL 1000 MG TABS: 1000 | 30 days supply | Qty: 60 | Fill #5

## 2019-08-22 MED FILL — ?LEVOTHYROXINE 50 MCG TABLE: 50 | 30 days supply | Qty: 45 | Fill #6

## 2019-08-22 MED FILL — ?ATORVASTATIN 40MG TABLET: 40 | 30 days supply | Qty: 30 | Fill #6

## 2019-08-22 MED FILL — metFORMIN HCL 1000 MG TABS: 1000 | 30 days supply | Qty: 60 | Fill #6

## 2019-09-06 ENCOUNTER — Ambulatory Visit: Payer: Self-pay | Attending: Internal Medicine | Admitting: Internal Medicine

## 2019-09-06 ENCOUNTER — Other Ambulatory Visit: Payer: Self-pay

## 2019-09-06 ENCOUNTER — Ambulatory Visit: Payer: Self-pay | Admitting: Internal Medicine

## 2019-09-06 ENCOUNTER — Encounter: Payer: Self-pay | Admitting: Internal Medicine

## 2019-09-06 VITALS — BP 113/66 | HR 68 | Temp 98.1°F | Resp 18 | Ht 62.0 in | Wt 143.0 lb

## 2019-09-06 DIAGNOSIS — E611 Iron deficiency: Secondary | ICD-10-CM

## 2019-09-06 DIAGNOSIS — Z23 Encounter for immunization: Secondary | ICD-10-CM

## 2019-09-06 DIAGNOSIS — E119 Type 2 diabetes mellitus without complications: Secondary | ICD-10-CM

## 2019-09-06 DIAGNOSIS — E785 Hyperlipidemia, unspecified: Secondary | ICD-10-CM

## 2019-09-06 DIAGNOSIS — E1169 Type 2 diabetes mellitus with other specified complication: Secondary | ICD-10-CM

## 2019-09-06 DIAGNOSIS — E039 Hypothyroidism, unspecified: Secondary | ICD-10-CM

## 2019-09-06 LAB — POCT GLYCOSYLATED HEMOGLOBIN (HGB A1C): Hemoglobin A1C: 7.8 % — AB (ref 4.0–5.6)

## 2019-09-06 LAB — GLUCOSE, POCT (MANUAL RESULT ENTRY): POC Glucose: 81 mg/dl (ref 70–99)

## 2019-09-06 NOTE — Progress Notes (Signed)
Patient ID: Cynthia Wiley, female    DOB: September 04, 1966  MRN: [id number]  CC: Follow-up   Subjective: Cynthia Wiley is a 53 y.o. female who presents for chronic ds management Her concerns today include:  pt with hx of DM type 2, HL, iron defand hypothyroid.  DM:  Checks BS daily in mornings.   Range 118-147 Gained over 10 lbs since March of this year.  Patient states that during [place] pandemic she was stuck in the house and not working for 3 months.  During that time she was eating more and not exercising.  Reports now that she is working again, eating healthier and walking 30 minutes daily Compliant with Metformin Over due for eye exam.  Plans to get done before end of the yr  HL:  Tolerating Atorvastatin  Hypothyroid: Taking and tolerating levothyroxine.    Anemia: On last visit we had to discontinue the iron supplement.  Plan is to recheck CBC today Patient Active Problem List   Diagnosis Date Noted  . Anemia 02/14/2019  . Hyperlipidemia 08/07/2017  . DM2 (diabetes mellitus, type 2) ([place]) 01/22/2015  . Hypothyroidism 01/22/2015     Current Outpatient Medications on File Prior to Visit  Medication Sig Dispense Refill  . aspirin 81 MG tablet Take 1 tablet (81 mg total) by mouth daily. 30 tablet 11  . atorvastatin (LIPITOR) 40 MG tablet Take 1 tablet (40 mg total) by mouth daily. 30 tablet 11  . Blood Glucose Monitoring Suppl (TRUE METRIX METER) w/Device KIT 1 each by Does not apply route as needed. 1 kit 0  . ferrous sulfate (FEROSUL) 325 (65 FE) MG tablet TAKE 1 TABLET (325 MG TOTAL) BY MOUTH DAILY WITH BREAKFAST. 90 tablet 0  . glucose blood (TRUE METRIX BLOOD GLUCOSE TEST) test strip Use as instructed 100 each 11  . levothyroxine (SYNTHROID, LEVOTHROID) 50 MCG tablet Take 1.5 tablets (75 mcg total) by mouth daily. 30 tablet 11  . metFORMIN (GLUCOPHAGE) 1000 MG tablet Take 1 tablet (1,000 mg total) by mouth 2 (two) times daily with a meal. 60 tablet 11  .  TRUEPLUS LANCETS 28G MISC 1 each by Does not apply route 2 (two) times daily. 100 each 11   No current facility-administered medications on file prior to visit.     Allergies  Allergen Reactions  . Onglyza [Saxagliptin] Nausea Only  . Sulfonylureas Other (See Comments)    Severe stomach ache with Amaryl and glipizide XL     Social History   Socioeconomic History  . Marital status: Single    Spouse name: Not on file  . Number of children: Not on file  . Years of education: Not on file  . Highest education level: Not on file  Occupational History  . Not on file  Social Needs  . Financial resource strain: Not on file  . Food insecurity    Worry: Not on file    Inability: Not on file  . Transportation needs    Medical: Not on file    Non-medical: Not on file  Tobacco Use  . Smoking status: Never Smoker  . Smokeless tobacco: Never Used  Substance and Sexual Activity  . Alcohol use: No  . Drug use: No  . Sexual activity: Yes    Birth control/protection: None  Lifestyle  . Physical activity    Days per week: Patient refused    Minutes per session: Patient refused  . Stress: Not on file  Relationships  .  Social Herbalist on phone: Patient refused    Gets together: Patient refused    Attends religious service: Patient refused    Active member of club or organization: Patient refused    Attends meetings of clubs or organizations: Patient refused    Relationship status: Patient refused  . Intimate partner violence    Fear of current or ex partner: Patient refused    Emotionally abused: Patient refused    Physically abused: Patient refused    Forced sexual activity: Patient refused  Other Topics Concern  . Not on file  Social History Narrative  . Not on file    Family History  Problem Relation Age of Onset  . Diabetes Father   . Heart disease Father   . Diabetes Sister   . Hyperlipidemia Brother     Past Surgical History:  Procedure Laterality  Date  . ABDOMINAL HYSTERECTOMY N/A 01/31/2013   Procedure: HYSTERECTOMY ABDOMINAL;  Surgeon: Lavonia Drafts, MD;  Location: [facility];  Service: Gynecology;  Laterality: N/A;  . BILATERAL SALPINGECTOMY Bilateral 01/31/2013   Procedure: BILATERAL SALPINGECTOMY;  Surgeon: Lavonia Drafts, MD;  Location: [facility];  Service: Gynecology;  Laterality: Bilateral;  . CYSTOSCOPY WITH STENT PLACEMENT Bilateral 01/31/2013   Procedure: CYSTOSCOPY WITH STENT PLACEMENT;  Surgeon: Malka So, MD;  Location: [facility];  Service: Urology;  Laterality: Bilateral;  with c-arm    ROS: Review of Systems Negative except as stated above  PHYSICAL EXAM: BP 113/66 (BP Location: Left Arm, Patient Position: Sitting, Cuff Size: Normal)   Pulse 68   Temp 98.1 F (36.7 C) (Oral)   Resp 18   Ht _0  (1.575 m)   Wt 143 lb (64.9 kg)   LMP 01/19/2013   SpO2 100%   BMI 26.16 kg/m   Wt Readings from Last 3 Encounters:  09/06/19 143 lb (64.9 kg)  02/14/19 129 lb (58.5 kg)  10/18/18 139 lb 3.2 oz (63.1 kg)    Physical Exam  General appearance - alert, well appearing, and in no distress Mental status - normal mood, behavior, speech, dress, motor activity, and thought processes Neck - supple, no significant adenopathy Chest - clear to auscultation, no wheezes, rales or rhonchi, symmetric air entry Heart - normal rate, regular rhythm, normal S1, S2, no murmurs, rubs, clicks or gallops Extremities - peripheral pulses normal, no pedal edema, no clubbing or cyanosis Diabetic Foot Exam - Simple   Simple Foot Form Visual Inspection No deformities, no ulcerations, no other skin breakdown bilaterally: Yes Sensation Testing Intact to touch and monofilament testing bilaterally: Yes Pulse Check Posterior Tibialis and Dorsalis pulse intact bilaterally: Yes Comments    Results for orders placed or performed in visit on 09/06/19  POCT A1C  Result Value Ref Range   Hemoglobin A1C 7.8 (A) 4.0 - 5.6 %   HbA1c  POC (<> result, manual entry)     HbA1c, POC (prediabetic range)     HbA1c, POC (controlled diabetic range)    Glucose (CBG)  Result Value Ref Range   POC Glucose 81 70 - 99 mg/dl     CMP Latest Ref Rng & Units 02/14/2019 02/05/2018 02/07/2017  Glucose 65 - 99 mg/dL 81 90 113(H)  BUN 6 - 24 mg/dL _1 Creatinine 0.57 - 1.00 mg/dL 0.79 0.75 0.61  Sodium 134 - 144 mmol/L 140 141 138  Potassium 3.5 - 5.2 mmol/L 4.6 4.7 4.9  Chloride 96 - 106 mmol/L 102 101 101  CO2  20 - 29 mmol/L _0 Calcium 8.7 - 10.2 mg/dL 9.9 9.6 9.6  Total Protein 6.0 - 8.5 g/dL 7.1 7.5 -  Total Bilirubin 0.0 - 1.2 mg/dL 0.5 0.4 -  Alkaline Phos 39 - 117 IU/L 85 99 -  AST 0 - 40 IU/L 23 14 -  ALT 0 - 32 IU/L 17 12 -   Lipid Panel     Component Value Date/Time   CHOL 128 02/14/2019 1626   TRIG 88 02/14/2019 1626   HDL 47 02/14/2019 1626   CHOLHDL 2.7 02/14/2019 1626   CHOLHDL 2.7 02/07/2017 1626   VLDL 17 02/07/2017 1626   LDLCALC 63 02/14/2019 1626    CBC    Component Value Date/Time   WBC 8.4 02/14/2019 1626   WBC 7.1 01/22/2015 1047   RBC 4.36 02/14/2019 1626   RBC 4.73 01/22/2015 1047   HGB 12.0 02/14/2019 1626   HCT 37.1 02/14/2019 1626   PLT 336 02/14/2019 1626   MCV 85 02/14/2019 1626   MCH 27.5 02/14/2019 1626   MCH 26.8 01/22/2015 1047   MCHC 32.3 02/14/2019 1626   MCHC 32.0 01/22/2015 1047   RDW 13.0 02/14/2019 1626   LYMPHSABS 2.4 01/06/2009 2230   MONOABS 0.6 01/06/2009 2230   EOSABS 0.1 01/06/2009 2230   BASOSABS 0.0 01/06/2009 2230    ASSESSMENT AND PLAN:  1. Type 2 diabetes mellitus without complication, without long-term current use of insulin (HCC) A1c has increased compared to last visit.  Patient attributes this to poor eating habits and not exercising for several months during [place] pandemic.  She is now doing better with eating habits and has started walking daily.  She wants to hold off on adding another diabetic medication until she returns in 3 months.  If the  A1c is not less than 7 she would be agreeable to that.  She will continue metformin for now Encouraged her to try to get her eye exam done before next visit. - POCT A1C - Glucose (CBG) - CBC  2. Hyperlipidemia associated with type 2 diabetes mellitus ([place]) Continue atorvastatin  3. Acquired hypothyroidism Continue levothyroxine  4. Need for influenza vaccination Given  5. Iron deficiency Recheck CBC today.  Off iron supplement.    Patient was given the opportunity to ask questions.  Patient verbalized understanding of the plan and was able to repeat key elements of the plan.  [employer] interpreter used during this encounter. #[id number]  No orders of the defined types were placed in this encounter.    Requested Prescriptions    No prescriptions requested or ordered in this encounter    No follow-ups on file.  Karle Plumber, MD, FACP

## 2019-09-07 LAB — CBC
Hematocrit: 35.9 % (ref 34.0–46.6)
Hemoglobin: 11.8 g/dL (ref 11.1–15.9)
MCH: 26.9 pg (ref 26.6–33.0)
MCHC: 32.9 g/dL (ref 31.5–35.7)
MCV: 82 fL (ref 79–97)
Platelets: 366 10*3/uL (ref 150–450)
RBC: 4.39 x10E6/uL (ref 3.77–5.28)
RDW: 12.9 % (ref 11.7–15.4)
WBC: 9.7 10*3/uL (ref 3.4–10.8)

## 2019-09-16 ENCOUNTER — Telehealth: Payer: Self-pay | Admitting: *Deleted

## 2019-09-16 NOTE — Telephone Encounter (Signed)
Patient called office to get lab results and results was given to her by Rolm Gala per clinical staff. Patient verbalized understanding.

## 2019-09-17 ENCOUNTER — Telehealth: Payer: Self-pay | Admitting: *Deleted

## 2019-09-17 NOTE — Telephone Encounter (Signed)
-----   Message from Ladell Pier, MD sent at 09/08/2019  2:14 PM EDT ----- Let pt know that her blood count is still nl. Try to eat iron rich foods like spinach, chicken liver etc

## 2019-09-17 NOTE — Telephone Encounter (Signed)
Medical Assistant used [employer] to contact patient.  Interpreter Name: Donnajean Lopes #: [id number] [place] states to give a call back to [place] with [facility] at [phone]. Patient is aware of blood count being normal

## 2019-09-25 ENCOUNTER — Other Ambulatory Visit: Payer: Self-pay | Admitting: Pharmacist

## 2019-09-25 DIAGNOSIS — E039 Hypothyroidism, unspecified: Secondary | ICD-10-CM

## 2019-09-25 MED ORDER — LEVOTHYROXINE SODIUM 50 MCG PO TABS: 75.0000 ug | ORAL_TABLET | Freq: Every day | ORAL | 11 refills | Status: DC

## 2019-09-25 MED FILL — ?ATORVASTATIN 40MG TABLET: 40 | 30 days supply | Qty: 30 | Fill #7

## 2019-09-25 MED FILL — ?LEVOTHYROXINE SOD 50 MCG: 50 | 30 days supply | Qty: 45 | Fill #0

## 2019-09-25 MED FILL — metFORMIN HCL 1000 MG TABS: 1000 | 30 days supply | Qty: 60 | Fill #7

## 2019-10-16 MED FILL — ?ATORVASTATIN 40MG TABLET: 40 | 30 days supply | Qty: 30 | Fill #8

## 2019-10-16 MED FILL — metFORMIN HCL 1000 MG TABS: 1000 | 30 days supply | Qty: 60 | Fill #8

## 2019-10-16 MED FILL — ?LEVOTHYROXINE SOD 50 MCG: 50 | 30 days supply | Qty: 45 | Fill #1

## 2019-10-17 ENCOUNTER — Ambulatory Visit: Payer: Self-pay | Attending: Internal Medicine | Admitting: Physician Assistant

## 2019-10-17 ENCOUNTER — Other Ambulatory Visit: Payer: Self-pay

## 2019-10-17 VITALS — BP 112/68 | HR 75 | Temp 98.0°F | Ht 62.0 in | Wt 145.0 lb

## 2019-10-17 DIAGNOSIS — T22112A Burn of first degree of left forearm, initial encounter: Secondary | ICD-10-CM

## 2019-10-17 DIAGNOSIS — W290XXA Contact with powered kitchen appliance, initial encounter: Secondary | ICD-10-CM

## 2019-10-17 DIAGNOSIS — T300 Burn of unspecified body region, unspecified degree: Secondary | ICD-10-CM

## 2019-10-17 MED ORDER — SILVER SULFADIAZINE 1 % EX CREA: 1.0000 "application " | TOPICAL_CREAM | Freq: Every day | CUTANEOUS | 0 refills | Status: DC

## 2019-10-17 MED FILL — SSD 1% CREAM: 1 | 14 days supply | Qty: 50 | Fill #0

## 2019-10-17 NOTE — Patient Instructions (Signed)
Cuidados de las [employer], Adult [place] es una lesin en la piel o de los tejidos que se encuentran debajo de la piel. Hay tres tipos de quemaduras:  Neurosurgeon. Estas quemaduras podran causar enrojecimiento y un poco de hinchazn en la piel.  [employer]. Estas quemaduras son Orlene Erm dolorosas y producen mucho enrojecimiento en la piel. Tambin es posible que la piel secrete lquido y tenga un aspecto brillante, y se formen ampollas.  Tercer Alan Ripper. Estas quemaduras pueden causar un [employer]. La piel se vuelve blanca o negra, y puede parecer carbonizada, seca y curtida. El cuidado correcto de las quemaduras puede ayudar a Information systems manager y a [place] infecciones. Adems puede ayudar a que la cicatrizacin sea ms rpida. Cmo se trata? Inmediatamente despus de sufrir la quemadura:  Enjuague o remoje la quemadura con agua fra. Hgalo durante varios minutos. No coloque hielo en la quemadura. Esto puede causarle ms dao.  Cubra ligeramente la quemadura con una venda(vendaje) limpia (estril). Cuidado de las [employer] est sentado o acostado, eleve la zona de la lesin por encima del nivel del corazn.  Siga las indicaciones del mdico respecto de lo siguiente: ? Cmo limpiar y cuidar la [place]. ? Cundo debe cambiar y Music therapist venda.  Revsese la [employer] para detectar signos de infeccin. Est atento a los siguientes signos: ? Aumento del enrojecimiento, la hinchazn o Conservation officer, historic buildings. ? [place]. ? Pus o mal olor. Medicamentos   [employer]phi de venta libre y los recetados solamente como se lo haya indicado el mdico.  Si le recetaron un antibitico, tmelo o aplqueselo como se lo haya indicado el mdico. No deje de usar el antibitico, ni siquiera si el cuadro clnico mejora. Instrucciones generales  Para prevenir la infeccin: ? No coloque mantequilla, aceite ni otros remedios caseros [employer]. ? No rascar ni tocar la quemadura. ? No romper ninguna ampolla. ? No quitar las escamas de piel.  No frote la quemadura, ni siquiera cuando la est limpiando.  Proteja la quemadura del sol. Comunquese con un mdico si:  El cuadro clnico no mejora.  La afeccin empeora.  Tiene fiebre.  La quemadura se ve diferente, o aparecen manchas negras o rojas sobre ella.  La quemadura est caliente al tacto.  El dolor no se alivia con los [employer]. Solicite ayuda de inmediato si:  Tiene enrojecimiento, hinchazn o Management consultant de la [place].  Observa lquido, sangre o pus que sale de la [place].  Tiene lneas rojas cerca de la [place].  Siente Geophysical data processor. Esta informacin no tiene Marine scientist el consejo del mdico. Asegrese de hacerle al mdico cualquier pregunta que tenga. Document Released: 06/06/2011 Document Revised: 06/12/2017 Document Reviewed: 05/10/2016 Elsevier Patient Education  2020 [employer].

## 2019-10-17 NOTE — Progress Notes (Signed)
Patient ID: Cynthia Wiley, female   DOB: [birth year], 54 y.o.   MRN: [id number]   Cynthia Wiley, is a 53 y.o. female  YNW:[id number]  MVH:[id number]  DOB - 1966/04/06  Subjective:  Chief Complaint and HPI: Cynthia Wiley is a 53 y.o. female here today with a burn of her L forearm.  Happened against a hot kitchen surface.  Last tetanus shot about 1 year ago.  No fever.  Occurred about 10 days ago.  No fever.  guaze keeps pulling top layer of skin off.    ROS:   Constitutional:  No f/c, No night sweats, No unexplained weight loss. EENT:  No vision changes, No blurry vision, No hearing changes. No mouth, throat, or ear problems.  Respiratory: No cough, No SOB Cardiac: No CP, no palpitations GI:  No abd pain, No N/V/D. GU: No Urinary s/sx Musculoskeletal: No joint pain Neuro: No headache, no dizziness, no motor weakness.  Skin: No rash Endocrine:  No polydipsia. No polyuria.  Psych: Denies SI/HI  No problems updated.  ALLERGIES: Allergies  Allergen Reactions  . Onglyza [Saxagliptin] Nausea Only  . Sulfonylureas Other (See Comments)    Severe stomach ache with Amaryl and glipizide XL     PAST MEDICAL HISTORY: Past Medical History:  Diagnosis Date  . Diabetes mellitus 01/07/2008  . Hyperlipidemia   . Hypothyroidism   . Pelvic pain in female   . Thyroid disease 2010    MEDICATIONS AT HOME: Prior to Admission medications   Medication Sig Start Date End Date Taking? Authorizing Provider  aspirin 81 MG tablet Take 1 tablet (81 mg total) by mouth daily. 02/14/17  Yes Funches, Josalyn, MD  atorvastatin (LIPITOR) 40 MG tablet Take 1 tablet (40 mg total) by mouth daily. 02/14/19  Yes Ladell Pier, MD  Blood Glucose Monitoring Suppl (TRUE METRIX METER) w/Device KIT 1 each by Does not apply route as needed. 10/18/18  Yes Ladell Pier, MD  glucose blood (TRUE METRIX BLOOD GLUCOSE TEST) test strip Use as instructed 10/18/18  Yes Ladell Pier, MD   levothyroxine (SYNTHROID) 50 MCG tablet Take 1.5 tablets (75 mcg total) by mouth daily. 09/25/19  Yes Ladell Pier, MD  metFORMIN (GLUCOPHAGE) 1000 MG tablet Take 1 tablet (1,000 mg total) by mouth 2 (two) times daily with a meal. 02/14/19  Yes Ladell Pier, MD  TRUEPLUS LANCETS 28G MISC 1 each by Does not apply route 2 (two) times daily. 10/18/18  Yes Ladell Pier, MD  silver sulfADIAZINE (SILVADENE) 1 % cream Apply 1 application topically daily. 10/17/19   Argentina Donovan, PA-C     Objective:  EXAM:   Vitals:   10/17/19 1043  BP: 112/68  Pulse: 75  Temp: 98 F (36.7 C)  TempSrc: Oral  SpO2: 99%  Weight: 145 lb (65.8 kg)  Height: '5\' 2"'  (1.575 m)    General appearance : A&OX3. NAD. Non-toxic-appearing HEENT: Atraumatic and Normocephalic.  PERRLA. EOM intact.   Chest/Lungs:  Breathing-non-labored, Good air entry bilaterally, breath sounds normal without rales, rhonchi, or wheezing  CVS: S1 S2 regular, no murmurs, gallops, rubs  L volar forearm about midway-2x3 cm partial thickness burn.  No secondary infection Neurology:  CN II-XII grossly intact, Non focal.   Psych:  TP linear. J/I WNL. Normal speech. Appropriate eye contact and affect.  Skin:  No Rash  Data Review Lab Results  Component Value Date   HGBA1C 7.8 (A) 09/06/2019   HGBA1C 7.5 (A) 02/14/2019  HGBA1C 7.8 (H) 10/18/2018     Assessment & Plan   1. Burn Dressing applied - silver sulfADIAZINE (SILVADENE) 1 % cream; Apply 1 application topically daily.  Dispense: 50 g; Refill: 0  Patient have been counseled extensively about nutrition and exercise  Return for 1/8 appt with PCP.  The patient was given clear instructions to go to ER or return to medical center if symptoms don't improve, worsen or new problems develop. The patient verbalized understanding. The patient was told to call to get lab results if they haven't heard anything in the next week.     Freeman Caldron, PA-C [facility] [place], [place]   10/17/2019, 11:08 AM

## 2019-10-17 NOTE — Progress Notes (Signed)
Patient has burn on left forearm.

## 2019-11-22 MED FILL — metFORMIN HCL 1000 MG TABS: 1000 | 30 days supply | Qty: 60 | Fill #9

## 2019-11-22 MED FILL — ?ATORVASTATIN 40MG TABLET: 40 | 30 days supply | Qty: 30 | Fill #9

## 2019-11-22 MED FILL — ?LEVOTHYROXINE SOD 50 MCG: 50 | 30 days supply | Qty: 45 | Fill #2

## 2019-12-13 ENCOUNTER — Other Ambulatory Visit: Payer: Self-pay

## 2019-12-13 ENCOUNTER — Ambulatory Visit: Payer: Self-pay | Admitting: Internal Medicine

## 2019-12-18 MED FILL — ?LEVOTHYROXINE SOD 50 MCG: 50 | 30 days supply | Qty: 45 | Fill #3

## 2019-12-18 MED FILL — metFORMIN HCL 1000 MG TABS: 1000 | 30 days supply | Qty: 60 | Fill #10

## 2019-12-18 MED FILL — ?ATORVASTATIN 40MG TABLET: 40 | 30 days supply | Qty: 30 | Fill #10

## 2019-12-20 ENCOUNTER — Encounter: Payer: Self-pay | Admitting: Internal Medicine

## 2019-12-20 ENCOUNTER — Ambulatory Visit: Payer: Self-pay | Attending: Internal Medicine | Admitting: Internal Medicine

## 2019-12-20 ENCOUNTER — Other Ambulatory Visit: Payer: Self-pay

## 2019-12-20 VITALS — BP 138/76 | HR 76 | Temp 98.4°F | Resp 16 | Wt 139.8 lb

## 2019-12-20 DIAGNOSIS — E785 Hyperlipidemia, unspecified: Secondary | ICD-10-CM

## 2019-12-20 DIAGNOSIS — E1169 Type 2 diabetes mellitus with other specified complication: Secondary | ICD-10-CM

## 2019-12-20 DIAGNOSIS — R030 Elevated blood-pressure reading, without diagnosis of hypertension: Secondary | ICD-10-CM

## 2019-12-20 DIAGNOSIS — E119 Type 2 diabetes mellitus without complications: Secondary | ICD-10-CM

## 2019-12-20 LAB — POCT GLYCOSYLATED HEMOGLOBIN (HGB A1C): HbA1c, POC (controlled diabetic range): 7.8 % — AB (ref 0.0–7.0)

## 2019-12-20 LAB — GLUCOSE, POCT (MANUAL RESULT ENTRY): POC Glucose: 96 mg/dl (ref 70–99)

## 2019-12-20 MED ORDER — FARXIGA 5 MG PO TABS: 5.0000 mg | ORAL_TABLET | Freq: Every day | ORAL | 3 refills | Status: DC

## 2019-12-20 MED ORDER — ASPIRIN EC 81 MG PO TBEC: 81.0000 mg | DELAYED_RELEASE_TABLET | Freq: Every day | ORAL | 1 refills | Status: AC

## 2019-12-20 MED ORDER — TRUE METRIX BLOOD GLUCOSE TEST VI STRP: ORAL_STRIP | 11 refills | Status: DC

## 2019-12-20 MED FILL — TRUE METRIX TEST STRIP: 25 days supply | Qty: 100 | Fill #0

## 2019-12-20 MED FILL — !FARXIGA 5 MG TABLET: 5 | 30 days supply | Qty: 30 | Fill #0

## 2019-12-20 NOTE — Progress Notes (Signed)
Patient ID: Cynthia Wiley, female    DOB: November 07, 1966  MRN: [id number]  CC: Diabetes   Subjective: Cynthia Wiley is a 54 y.o. female who presents for chronic ds management Her concerns today include:  pt with hx of DM type 2, HL, iron defand hypothyroid. Last seen 09/2019  DIABETES TYPE 2 Last A1C:   Results for orders placed or performed in visit on 12/20/19  Glucose (CBG)  Result Value Ref Range   POC Glucose 96 70 - 99 mg/dl  HgB A1c  Result Value Ref Range   Hemoglobin A1C     HbA1c POC (<> result, manual entry)     HbA1c, POC (prediabetic range)     HbA1c, POC (controlled diabetic range) 7.8 (A) 0.0 - 7.0 %    Med Adherence:  '[x]'$  Yes    '[]'$  No Medication side effects:  '[]'$  Yes    '[x]'$  No Home Monitoring?  '[x]'$  Yes  In mornings and sometimes at nights Home glucose results range:  Mornings before BF.  117-127 Diet Adherence: '[x]'$  Yes    '[]'$  No Exercise: '[x]'$  Yes.  30-40 mins daily  Hypoglycemic episodes?: '[]'$  Yes    '[x]'$  No Numbness of the feet? '[]'$  Yes    '[x]'$  No Retinopathy hx? '[]'$  Yes    '[]'$  No Last eye exam:  Comments:   HL:  Taking and tolerating Lipitor  BP elev: eat some pickled cucumber earlier this week.  She states that it was very salted..   Patient Active Problem List   Diagnosis Date Noted  . Anemia 02/14/2019  . Hyperlipidemia 08/07/2017  . DM2 (diabetes mellitus, type 2) ([place]) 01/22/2015  . Hypothyroidism 01/22/2015     Current Outpatient Medications on File Prior to Visit  Medication Sig Dispense Refill  . atorvastatin (LIPITOR) 40 MG tablet Take 1 tablet (40 mg total) by mouth daily. 30 tablet 11  . Blood Glucose Monitoring Suppl (TRUE METRIX METER) w/Device KIT 1 each by Does not apply route as needed. 1 kit 0  . levothyroxine (SYNTHROID) 50 MCG tablet Take 1.5 tablets (75 mcg total) by mouth daily. 30 tablet 11  . metFORMIN (GLUCOPHAGE) 1000 MG tablet Take 1 tablet (1,000 mg total) by mouth 2 (two) times daily with a meal. 60 tablet  11  . TRUEPLUS LANCETS 28G MISC 1 each by Does not apply route 2 (two) times daily. 100 each 11   No current facility-administered medications on file prior to visit.    Allergies  Allergen Reactions  . Onglyza [Saxagliptin] Nausea Only  . Sulfonylureas Other (See Comments)    Severe stomach ache with Amaryl and glipizide XL     Social History   Socioeconomic History  . Marital status: Single    Spouse name: Not on file  . Number of children: Not on file  . Years of education: Not on file  . Highest education level: Not on file  Occupational History  . Not on file  Tobacco Use  . Smoking status: Never Smoker  . Smokeless tobacco: Never Used  Substance and Sexual Activity  . Alcohol use: No  . Drug use: No  . Sexual activity: Yes    Birth control/protection: None  Other Topics Concern  . Not on file  Social History Narrative  . Not on file   Social Determinants of Health   Financial Resource Strain:   . Difficulty of Paying Living Expenses: Not on file  Food Insecurity:   . Worried About  Running Out of Food in the Last Year: Not on file  . Ran Out of Food in the Last Year: Not on file  Transportation Needs:   . Lack of Transportation (Medical): Not on file  . Lack of Transportation (Non-Medical): Not on file  Physical Activity:   . Days of Exercise per Week: Not on file  . Minutes of Exercise per Session: Not on file  Stress:   . Feeling of Stress : Not on file  Social Connections:   . Frequency of Communication with Friends and Family: Not on file  . Frequency of Social Gatherings with Friends and Family: Not on file  . Attends Religious Services: Not on file  . Active Member of Clubs or Organizations: Not on file  . Attends Archivist Meetings: Not on file  . Marital Status: Not on file  Intimate Partner Violence:   . Fear of Current or Ex-Partner: Not on file  . Emotionally Abused: Not on file  . Physically Abused: Not on file  . Sexually  Abused: Not on file    Family History  Problem Relation Age of Onset  . Diabetes Father   . Heart disease Father   . Diabetes Sister   . Hyperlipidemia Brother     Past Surgical History:  Procedure Laterality Date  . ABDOMINAL HYSTERECTOMY N/A 01/31/2013   Procedure: HYSTERECTOMY ABDOMINAL;  Surgeon: Lavonia Drafts, MD;  Location: [facility];  Service: Gynecology;  Laterality: N/A;  . BILATERAL SALPINGECTOMY Bilateral 01/31/2013   Procedure: BILATERAL SALPINGECTOMY;  Surgeon: Lavonia Drafts, MD;  Location: [facility];  Service: Gynecology;  Laterality: Bilateral;  . CYSTOSCOPY WITH STENT PLACEMENT Bilateral 01/31/2013   Procedure: CYSTOSCOPY WITH STENT PLACEMENT;  Surgeon: Malka So, MD;  Location: [facility];  Service: Urology;  Laterality: Bilateral;  with c-arm    ROS: Review of Systems Negative except as stated above  PHYSICAL EXAM: BP 138/76   Pulse 76   Temp 98.4 F (36.9 C) (Oral)   Resp 16   Wt 139 lb 12.8 oz (63.4 kg)   LMP 01/19/2013   SpO2 98%   BMI 25.57 kg/m   Physical Exam  General appearance - alert, well appearing, and in no distress Mental status - normal mood, behavior, speech, dress, motor activity, and thought processes Neck - supple, no significant adenopathy Chest - clear to auscultation, no wheezes, rales or rhonchi, symmetric air entry Heart - normal rate, regular rhythm, normal S1, S2, no murmurs, rubs, clicks or gallops Extremities - peripheral pulses normal, no pedal edema, no clubbing or cyanosis   CMP Latest Ref Rng & Units 02/14/2019 02/05/2018 02/07/2017  Glucose 65 - 99 mg/dL 81 90 113(H)  BUN 6 - 24 mg/dL _0 Creatinine 0.57 - 1.00 mg/dL 0.79 0.75 0.61  Sodium 134 - 144 mmol/L 140 141 138  Potassium 3.5 - 5.2 mmol/L 4.6 4.7 4.9  Chloride 96 - 106 mmol/L 102 101 101  CO2 20 - 29 mmol/L _1 Calcium 8.7 - 10.2 mg/dL 9.9 9.6 9.6  Total Protein 6.0 - 8.5 g/dL 7.1 7.5 -  Total Bilirubin 0.0 - 1.2 mg/dL 0.5 0.4 -  Alkaline  Phos 39 - 117 IU/L 85 99 -  AST 0 - 40 IU/L 23 14 -  ALT 0 - 32 IU/L 17 12 -   Lipid Panel     Component Value Date/Time   CHOL 128 02/14/2019 1626   TRIG 88 02/14/2019 1626   HDL 47  02/14/2019 1626   CHOLHDL 2.7 02/14/2019 1626   CHOLHDL 2.7 02/07/2017 1626   VLDL 17 02/07/2017 1626   LDLCALC 63 02/14/2019 1626    CBC    Component Value Date/Time   WBC 9.7 09/06/2019 1707   WBC 7.1 01/22/2015 1047   RBC 4.39 09/06/2019 1707   RBC 4.73 01/22/2015 1047   HGB 11.8 09/06/2019 1707   HCT 35.9 09/06/2019 1707   PLT 366 09/06/2019 1707   MCV 82 09/06/2019 1707   MCH 26.9 09/06/2019 1707   MCH 26.8 01/22/2015 1047   MCHC 32.9 09/06/2019 1707   MCHC 32.0 01/22/2015 1047   RDW 12.9 09/06/2019 1707   LYMPHSABS 2.4 01/06/2009 2230   MONOABS 0.6 01/06/2009 2230   EOSABS 0.1 01/06/2009 2230   BASOSABS 0.0 01/06/2009 2230    ASSESSMENT AND PLAN:  1. Type 2 diabetes mellitus without complication, without long-term current use of insulin (HCC) Blood sugars are good but A1c is not at goal.  Encouraged her to continue healthy eating habits and regular exercise.  Continue Metformin.  Add low-dose of Farxiga - Glucose (CBG) - HgB A1c - Microalbumin/Creatinine Ratio, Urine - dapagliflozin propanediol (FARXIGA) 5 MG TABS tablet; Take 5 mg by mouth daily before breakfast.  Dispense: 30 tablet; Refill: 3  2. Hyperlipidemia associated with type 2 diabetes mellitus ([place]) Continue atorvastatin  3. Elevated blood pressure reading Blood pressure on previous visits have been good.  DASH diet discussed and encouraged.  We will recheck blood pressure on follow-up visit    Patient was given the opportunity to ask questions.  Patient verbalized understanding of the plan and was able to repeat key elements of the plan.   Orders Placed This Encounter  Procedures  . Microalbumin/Creatinine Ratio, Urine  . Glucose (CBG)  . HgB A1c     Requested Prescriptions   Signed Prescriptions Disp  Refills  . dapagliflozin propanediol (FARXIGA) 5 MG TABS tablet 30 tablet 3    Sig: Take 5 mg by mouth daily before breakfast.  . aspirin EC 81 MG tablet 100 tablet 1    Sig: Take 1 tablet (81 mg total) by mouth daily.  Marland Kitchen glucose blood (TRUE METRIX BLOOD GLUCOSE TEST) test strip 100 each 11    Sig: Use as instructed    Return in about 4 months (around 04/18/2020).  Karle Plumber, MD, FACP

## 2019-12-20 NOTE — Patient Instructions (Signed)
Your A1c is not at goal.  We have added another medication to the Metformin called [place].  He will take the [place] once a day.

## 2019-12-21 LAB — MICROALBUMIN / CREATININE URINE RATIO
Creatinine, Urine: 19.2 mg/dL
Microalb/Creat Ratio: <16 mg/g creat (ref 0–29)
Microalbumin, Urine: <3 ug/mL

## 2019-12-31 ENCOUNTER — Telehealth: Payer: Self-pay

## 2019-12-31 NOTE — Telephone Encounter (Signed)
[employer] Tobin Chad  Id# [id number]  contacted pt to go over lab results pt didn't answer left a detailed vm informing pt of results and if she has any questions or concerns to give a call

## 2020-01-21 MED FILL — ?ATORVASTATIN 40MG TABL: 40 | 30 days supply | Qty: 30 | Fill #11

## 2020-01-21 MED FILL — TRUE METRIX TEST STRIP: 25 days supply | Qty: 100 | Fill #1

## 2020-01-21 MED FILL — LEVOTHYROXINE 50 MCG TABLET: 50 | 30 days supply | Qty: 45 | Fill #4

## 2020-01-21 MED FILL — metFORMIN HCL 1000 MG TABS: 1000 | 30 days supply | Qty: 60 | Fill #11

## 2020-02-19 ENCOUNTER — Other Ambulatory Visit: Payer: Self-pay | Admitting: Internal Medicine

## 2020-02-19 DIAGNOSIS — E785 Hyperlipidemia, unspecified: Secondary | ICD-10-CM

## 2020-02-19 DIAGNOSIS — E119 Type 2 diabetes mellitus without complications: Secondary | ICD-10-CM

## 2020-02-19 MED FILL — LEVOTHYROXINE 50 MCG TABLET: 50 | 30 days supply | Qty: 45 | Fill #5

## 2020-02-19 MED FILL — TRUE METRIX TEST STRIP: 25 days supply | Qty: 100 | Fill #2

## 2020-02-21 MED FILL — metFORMIN HCL 1000 MG TABS: 1000 | 30 days supply | Qty: 60 | Fill #0

## 2020-02-21 MED FILL — ?ATORVASTATIN 40MG TABLET: 40 | 30 days supply | Qty: 30 | Fill #0

## 2020-02-26 MED FILL — ?ATORVASTATIN 40MG TABLET: 40 | 30 days supply | Qty: 30 | Fill #1

## 2020-03-25 MED FILL — ?LEVOTHYROXINE 50 MCG TABLE: 50 | 30 days supply | Qty: 45 | Fill #6

## 2020-03-25 MED FILL — ?ATORVASTATIN 40MG TABLET: 40 | 30 days supply | Qty: 30 | Fill #2

## 2020-03-27 MED FILL — metFORMIN HCL 1000 MG TABS: 1000 | 30 days supply | Qty: 60 | Fill #1

## 2020-04-22 ENCOUNTER — Other Ambulatory Visit: Payer: Self-pay | Admitting: Internal Medicine

## 2020-04-22 DIAGNOSIS — E785 Hyperlipidemia, unspecified: Secondary | ICD-10-CM

## 2020-04-22 MED FILL — metFORMIN HCL 1000 MG TABS: 1000 | 30 days supply | Qty: 60 | Fill #2

## 2020-04-22 MED FILL — ?LEVOTHYROXINE 50 MCG TABLE: 50 | 30 days supply | Qty: 45 | Fill #7

## 2020-04-23 MED FILL — ?ATORVASTATIN 40MG TABLET: 40 | 30 days supply | Qty: 30 | Fill #0

## 2020-04-24 ENCOUNTER — Other Ambulatory Visit: Payer: Self-pay

## 2020-04-24 ENCOUNTER — Encounter: Payer: Self-pay | Admitting: Internal Medicine

## 2020-04-24 ENCOUNTER — Ambulatory Visit: Payer: Self-pay | Attending: Internal Medicine | Admitting: Family

## 2020-04-24 VITALS — BP 133/68 | HR 73 | Temp 97.5°F | Resp 16 | Wt 137.0 lb

## 2020-04-24 DIAGNOSIS — E039 Hypothyroidism, unspecified: Secondary | ICD-10-CM

## 2020-04-24 DIAGNOSIS — Z1231 Encounter for screening mammogram for malignant neoplasm of breast: Secondary | ICD-10-CM

## 2020-04-24 DIAGNOSIS — Z1211 Encounter for screening for malignant neoplasm of colon: Secondary | ICD-10-CM

## 2020-04-24 DIAGNOSIS — E119 Type 2 diabetes mellitus without complications: Secondary | ICD-10-CM

## 2020-04-24 DIAGNOSIS — Z603 Acculturation difficulty: Secondary | ICD-10-CM

## 2020-04-24 DIAGNOSIS — Z789 Other specified health status: Secondary | ICD-10-CM

## 2020-04-24 DIAGNOSIS — E785 Hyperlipidemia, unspecified: Secondary | ICD-10-CM

## 2020-04-24 LAB — GLUCOSE, POCT (MANUAL RESULT ENTRY): POC Glucose: 96 mg/dl (ref 70–99)

## 2020-04-24 LAB — POCT GLYCOSYLATED HEMOGLOBIN (HGB A1C): Hemoglobin A1C: 7.4 % — AB (ref 4.0–5.6)

## 2020-04-24 MED ORDER — LEVOTHYROXINE SODIUM 50 MCG PO TABS: 75.0000 ug | ORAL_TABLET | Freq: Every day | ORAL | 2 refills | Status: DC

## 2020-04-24 MED ORDER — ATORVASTATIN CALCIUM 40 MG PO TABS: 40.0000 mg | ORAL_TABLET | Freq: Every day | ORAL | 2 refills | Status: DC

## 2020-04-24 MED ORDER — METFORMIN HCL 1000 MG PO TABS: 1000.0000 mg | ORAL_TABLET | Freq: Two times a day (BID) | ORAL | 2 refills | Status: DC

## 2020-04-24 NOTE — Patient Instructions (Addendum)
Contine con Metformin, Atorvastatin y Synthroid. Laboratorios programados para la prxima semana y ven en [place]. Examen de deteccin de cncer de colon. Remisin para mamografa.  Continue Metformin, Atorvastatin, and Synthroid. Labs scheduled for next week and come fasting. Colon cancer screening. Referral for mammogram.  Informacin bsica sobre la diabetes Diabetes Basics  La diabetes (diabetes mellitus) es una enfermedad de larga duracin (crnica). Se produce cuando el cuerpo no utiliza Occupational hygienist (glucosa) que se libera de los alimentos despus de comer. La diabetes puede deberse a uno de [employer] o a ambos:  El pncreas no produce suficiente cantidad de una hormona llamada insulina.  El cuerpo no reacciona de forma normal a la insulina que produce. La insulina permite que ciertos azcares (glucosa) ingresen a las clulas del cuerpo. Esto le proporciona energa. Si tiene diabetes, los azcares no pueden ingresar a las clulas. Esto produce un aumento del nivel de Dispensing optician (hiperglucemia). Sigue estas instrucciones en tu casa: Cmo se trata la diabetes? Es posible que tenga que administrarse insulina u otros medicamentos para la diabetes todos los [place] para mantener el nivel de Location manager en la sangre equilibrado. Adminstrese los medicamentos para la diabetes todos los [place] se lo haya indicado el mdico. Haga una lista de los medicamentos para la diabetes aqu: Medicamentos para la diabetes  Nombre del medicamento: ______________________________ ? Cantidad (dosis): ________________ Mellody Drown (a.m./p.m.): _______________ Wilford Grist: ___________________________________  Micki Riley medicamento: ______________________________ ? Cantidad (dosis): ________________ Mellody Drown (a.m./p.m.): _______________ Wilford Grist: ___________________________________  Micki Riley medicamento: ______________________________ ? Cantidad (dosis): ________________ Mellody Drown (a.m./p.m.):  _______________ Wilford Grist: ___________________________________ Si [place] insulina, aprender cmo aplicrsela con inyecciones. Es posible que deba ajustar la cantidad en funcin de los alimentos que coma. Haga una lista de los tipos de [employer] [place] aqu: Insulina  Tipo de insulina: ______________________________ ? Cantidad (dosis): ________________ Mellody Drown (a.m./p.m.): _______________ Wilford Grist: ___________________________________  Suezanne Jacquet: ______________________________ ? Cantidad (dosis): ________________ Mellody Drown (a.m./p.m.): _______________ Wilford Grist: ___________________________________  Suezanne Jacquet: ______________________________ ? Cantidad (dosis): ________________ Mellody Drown (a.m./p.m.): _______________ Wilford Grist: ___________________________________  Suezanne Jacquet: ______________________________ ? Cantidad (dosis): ________________ Mellody Drown (a.m./p.m.): _______________ Wilford Grist: ___________________________________  Suezanne Jacquet: ______________________________ ? Cantidad (dosis): ________________ Mellody Drown (a.m./p.m.): _______________ Wilford Grist: ___________________________________ Cmo me controlo el nivel de azcar en la sangre?  Controle sus niveles de azcar en la sangre con un medidor de glucemia segn las indicaciones del mdico. El mdico fijar los objetivos del tratamiento para usted. Generalmente, los resultados de los niveles de azcar en la sangre deben ser los siguientes:  Antes de las comidas (preprandial): de 80 a 130mg /dl (de 4,4 a 7,42mmol/l).  Despus de las comidas (posprandial): por debajo de 180mg /dl (39mmol/l).  Nivel de A1c: menos del 7%. Anote las veces que se controlar los niveles de azcar en la sangre: Controles de azcar en la sangre  Hora: _______________ Wilford Grist: ___________________________________  Mellody Drown: _______________ Notas: ___________________________________  Hora: _______________ Notas: ___________________________________  Hora: _______________  Notas: ___________________________________  Hora: _______________ Notas: ___________________________________  Hora: _______________ Notas: ___________________________________  Sander Nephew debo saber acerca del nivel bajo de azcar en la sangre? Un nivel bajo de azcar en la sangre se denomina hipoglucemia. Este cuadro ocurre cuando el nivel de azcar en la sangre es igual o menor que 70mg /dl (3,37mmol/l). Entre los sntomas, se pueden incluir los siguientes:  Sentir: ? [employer]. ? Preocupacin o nervios (ansiedad). ? Sudoracin y [employer]. ? Confusin. ? Mareos. ? Somnolencia. ? Ganas de vomitar (nuseas).  Tener: ? Latidos cardacos acelerados. ? Dolor de [place]. ? Cambios en  la visin. ? Hormigueo y falta de sensibilidad (entumecimiento) alrededor de la boca, los labios o la [place]. ? Movimientos espasmdicos que no puede controlar (convulsiones).  Dificultades para hacer lo siguiente: ? Moverse (coordinacin). ? Dormir. ? Desmayos. ? Molestarse con facilidad (irritabilidad). Tratamiento del nivel bajo de azcar en la sangre Para tratar un nivel bajo de azcar en la sangre, ingiera un alimento o una bebida azucarada de inmediato. Si puede pensar con claridad y tragar de manera segura, siga la regla 15/15, que consiste en lo siguiente:  Consuma 15gramos de un hidrato de carbono de accin rpida (carbohidrato). Hable con su mdico acerca de cunto debera consumir.  Algunos hidratos de carbono de accin rpida son: ? Comprimidos de azcar (pastillas de glucosa). Consuma 3o 4pastillas de glucosa. ? De 6 a 8unidades de caramelos duros. ? De 4 a 6onzas (de 120 a 182ml) de jugo de frutas. ? De 4 a 6onzas (de 120 a 143ml) de refresco comn (no diettico). ? 1 cucharada (61ml) de miel o azcar.  Contrlese el nivel de azcar en la sangre 79minutos despus de ingerir el hidrato de carbono.  Si el nivel de azcar en la sangre todava es igual o menor que 70mg /dl  (3,58mmol/l), ingiera nuevamente 15gramos de un hidrato de carbono.  Si el nivel de azcar en la sangre no supera los 70mg /dl (3,95mmol/l) despus de 3intentos, solicite ayuda de inmediato.  Ingiera una comida o una colacin en el transcurso de 1hora despus de que el nivel de azcar en la sangre se haya normalizado. Tratamiento del nivel muy bajo de azcar en la sangre Si el nivel de azcar en la sangre es igual o menor que 54mg /dl (80mmol/l), significa que est muy bajo (hipoglucemia grave). Esto es Engineer, maintenance (IT). No espere a ver si los sntomas desaparecen. Solicite atencin mdica de inmediato. Comunquese con el servicio de emergencias de su localidad (911 en los [place]). No conduzca por sus propios medios [employer] hospital. Preguntas para hacerle al mdico  Es necesario que me rena con Radio broadcast assistant en el cuidado de la diabetes?  Qu equipos necesitar para cuidarme en casa?  Qu medicamentos para la diabetes necesito? Cundo debo tomarlos?  Con qu frecuencia debo controlar mi nivel de azcar en la sangre?  A qu nmero puedo llamar si tengo preguntas?  Cundo es mi prxima cita con el mdico?  Dnde puedo encontrar un grupo de apoyo para las personas con diabetes? Dnde buscar ms informacin  American Diabetes Association (Asociacin Estadounidense de la Diabetes): www.diabetes.org  American Association of Diabetes Educators (Asociacin Estadounidense de Instructores para el Cuidado de la Diabetes): www.diabeteseducator.org/patient-resources Comunquese con un mdico si:  El nivel de azcar en la sangre es igual o mayor que 240mg /dl (13,77mmol/dl) durante 2das seguidos.  Ha estado enfermo o ha tenido fiebre durante 2das o ms y no mejora.  Tiene alguno de estos problemas durante ms de 6horas: ? No puede comer ni beber. ? Siente malestar estomacal (nuseas). ? Vomita. ? Presenta heces lquidas (diarrea). Solicite ayuda inmediatamente  si:  El nivel de azcar en la sangre est por debajo de 54mg /dl (25mmol/l).  Se siente confundida.  Tiene dificultad para hacer lo siguiente: ? Pensar con claridad. ? La respiracin. Resumen  La diabetes (diabetes mellitus) es una enfermedad de larga duracin (crnica). Se produce cuando el cuerpo no utiliza Occupational hygienist (glucosa) que se libera de los alimentos despus de la digestin.  Aplquese la insulina y tome los medicamentos para la diabetes como se  lo hayan indicado.  Contrlese el nivel de azcar en la sangre todos los [place], con la frecuencia que le hayan indicado.  Concurra a todas las visitas de seguimiento como se lo haya indicado el mdico. Esto es importante. Esta informacin no tiene Marine scientist el consejo del mdico. Asegrese de hacerle al mdico cualquier pregunta que tenga. Document Revised: 01/16/2019 Document Reviewed: 03/30/2018 Elsevier Patient Education  [place].

## 2020-04-24 NOTE — Progress Notes (Signed)
F/u DM 

## 2020-04-24 NOTE — Progress Notes (Signed)
Patient ID: Cynthia Wiley, female    DOB: [birth year]  MRN: [id number]  CC: Medication refills   Subjective:  Cynthia Wiley is a 54 y.o. female with history of diabetes mellitus type 2, hypothyroidism, hyperlipidemia, and anemia who presents for medication refills.   1. DIABETES TYPE 2 FOLLOW-UP: Last A1C:  7.4, May 2021 Med Adherence:  '[]'  Yes    '[x]'  No, reports she took [place] once and her sugars went too low into the 70's and she did not feel well Medication side effects:  '[x]'  Yes, [place] see previous line Home Monitoring?  '[x]'  Yes    '[]'  No Home glucose results range: 98-112 on average the highest has been 130, sometimes goes to 80 Diet Adherence: '[x]'  Yes    '[]'  No  Exercise: '[x]'  Yes    '[]'  No Hypoglycemic episodes?: '[]'  Yes    '[x]'  No Numbness of the feet? '[]'  Yes    '[x]'  No Retinopathy hx? '[]'  Yes    '[x]'  No Last eye exam: 4 months ago   Comments: Last visit 12/20/2019 with Dr. Wynetta Emery. During that encounter A1C was not at goal. Continued on Metformin and low-dose Farxiga added to regimen. Labs were collected.  2. HYPERLIPIDEMIA FOLLOW-UP:  Last Lipid Panel results:  HDL  Date Value Ref Range Status  02/14/2019 47 >39 mg/dL Final   Triglycerides  Date Value Ref Range Status  02/14/2019 88 0 - 149 mg/dL Final    Med Adherence: '[x]'  Yes    '[]'  No Medication side effects: '[]'  Yes    '[x]'  No Muscle aches:  '[]'  Yes    '[x]'  No Diet Adherence: '[]'  Yes    '[x]'  No Comments:  Last visit 12/20/2019 with Dr. Wynetta Emery. During that encounter Atorvastatin continued.  3. HYPOTHYROIDISM FOLLOW-UP:  Patient presents for evaluation of thyroid function. Symptoms consist of 1denies fatigue, weight changes, heat/cold intolerance, bowel/skin changes or CVS symptoms. Symptoms have present for 5 years. The symptoms are none. Previous thyroid studies include TSH. Last visit 05/22/2020 with Dr. Wynetta Emery. During that encounter Levothyroxine continued.  Patient Active Problem List   Diagnosis Date  Noted  . Anemia 02/14/2019  . Hyperlipidemia 08/07/2017  . DM2 (diabetes mellitus, type 2) ([place]) 01/22/2015  . Hypothyroidism 01/22/2015     Current Outpatient Medications on File Prior to Visit  Medication Sig Dispense Refill  . aspirin EC 81 MG tablet Take 1 tablet (81 mg total) by mouth daily. 100 tablet 1  . atorvastatin (LIPITOR) 40 MG tablet TAKE 1 TABLET (40 MG TOTAL) BY MOUTH DAILY. 30 tablet 0  . Blood Glucose Monitoring Suppl (TRUE METRIX METER) w/Device KIT 1 each by Does not apply route as needed. 1 kit 0  . glucose blood (TRUE METRIX BLOOD GLUCOSE TEST) test strip Use as instructed 100 each 11  . levothyroxine (SYNTHROID) 50 MCG tablet Take 1.5 tablets (75 mcg total) by mouth daily. 30 tablet 11  . metFORMIN (GLUCOPHAGE) 1000 MG tablet TAKE 1 TABLET (1,000 MG TOTAL) BY MOUTH 2 (TWO) TIMES DAILY WITH A MEAL. 60 tablet 2  . dapagliflozin propanediol (FARXIGA) 5 MG TABS tablet Take 5 mg by mouth daily before breakfast. (Patient not taking: Reported on 04/24/2020) 30 tablet 3  . TRUEPLUS LANCETS 28G MISC 1 each by Does not apply route 2 (two) times daily. 100 each 11   No current facility-administered medications on file prior to visit.    Allergies  Allergen Reactions  . Onglyza [Saxagliptin] Nausea Only  . Sulfonylureas  Other (See Comments)    Severe stomach ache with Amaryl and glipizide XL     Social History   Socioeconomic History  . Marital status: Single    Spouse name: Not on file  . Number of children: Not on file  . Years of education: Not on file  . Highest education level: Not on file  Occupational History  . Not on file  Tobacco Use  . Smoking status: Never Smoker  . Smokeless tobacco: Never Used  Substance and Sexual Activity  . Alcohol use: No  . Drug use: No  . Sexual activity: Yes    Birth control/protection: None  Other Topics Concern  . Not on file  Social History Narrative  . Not on file   Social Determinants of Health   Financial  Resource Strain:   . Difficulty of Paying Living Expenses:   Food Insecurity:   . Worried About Charity fundraiser in the Last Year:   . Arboriculturist in the Last Year:   Transportation Needs:   . Film/video editor (Medical):   Marland Kitchen Lack of Transportation (Non-Medical):   Physical Activity:   . Days of Exercise per Week:   . Minutes of Exercise per Session:   Stress:   . Feeling of Stress :   Social Connections:   . Frequency of Communication with Friends and Family:   . Frequency of Social Gatherings with Friends and Family:   . Attends Religious Services:   . Active Member of Clubs or Organizations:   . Attends Archivist Meetings:   Marland Kitchen Marital Status:   Intimate Partner Violence:   . Fear of Current or Ex-Partner:   . Emotionally Abused:   Marland Kitchen Physically Abused:   . Sexually Abused:     Family History  Problem Relation Age of Onset  . Diabetes Father   . Heart disease Father   . Diabetes Sister   . Hyperlipidemia Brother     Past Surgical History:  Procedure Laterality Date  . ABDOMINAL HYSTERECTOMY N/A 01/31/2013   Procedure: HYSTERECTOMY ABDOMINAL;  Surgeon: Lavonia Drafts, MD;  Location: [facility];  Service: Gynecology;  Laterality: N/A;  . BILATERAL SALPINGECTOMY Bilateral 01/31/2013   Procedure: BILATERAL SALPINGECTOMY;  Surgeon: Lavonia Drafts, MD;  Location: [facility];  Service: Gynecology;  Laterality: Bilateral;  . CYSTOSCOPY WITH STENT PLACEMENT Bilateral 01/31/2013   Procedure: CYSTOSCOPY WITH STENT PLACEMENT;  Surgeon: Malka So, MD;  Location: [facility];  Service: Urology;  Laterality: Bilateral;  with c-arm    ROS: Review of Systems Negative except as stated above  PHYSICAL EXAM: BP 133/68 (BP Location: Right Arm, Patient Position: Sitting, Cuff Size: Normal)   Pulse 73   Temp (!) 97.5 F (36.4 C)   Resp 16   Wt 137 lb (62.1 kg)   LMP 01/19/2013   SpO2 100%   BMI 25.06 kg/m   Physical Exam General appearance - alert,  well appearing, and in no distress and oriented to person, place, and time Mental status - alert, oriented to person, place, and time, normal mood, behavior, speech, dress, motor activity, and thought processes Neck - supple, no significant adenopathy Lymphatics - no palpable lymphadenopathy, no hepatosplenomegaly Chest - clear to auscultation, no wheezes, rales or rhonchi, symmetric air entry, no tachypnea, retractions or cyanosis Heart - normal rate, regular rhythm, normal S1, S2, no murmurs, rubs, clicks or gallops  Results for orders placed or performed in visit on 04/24/20  POCT glucose (manual  entry)  Result Value Ref Range   POC Glucose 96 70 - 99 mg/dl  POCT glycosylated hemoglobin (Hb A1C)  Result Value Ref Range   Hemoglobin A1C 7.4 (A) 4.0 - 5.6 %   HbA1c POC (<> result, manual entry)     HbA1c, POC (prediabetic range)     HbA1c, POC (controlled diabetic range)      ASSESSMENT AND PLAN: 1. Type 2 diabetes mellitus without complication, without long-term current use of insulin ([place]): -Today A1C close to goal at 7.4. Blood sugar normal. -Patient reports side effects from [place] and has only taken medication once since it was prescribed related to side effects of low blood sugar and not feeling well. Will discontinue for now because of side effects and A1C is close to goal -Continue Metformin as prescribed.  -To achieve an A1C goal of less than or equal to 7.0 percent, a fasting blood sugar of 80 to 130 mg/dL and a postprandial glucose (90 to 120 minutes after a meal) less than 180 mg/dL. In the event of sugars less than 60 mg/dl or greater than 400 mg/dl please notify the clinic ASAP. It is recommended that you undergo annual eye exams and annual foot exams. -Discussed the importance of healthy eating habits, low-carbohydrate diet, low-sugar diet, regular aerobic exercise (at least 150 minutes a week as tolerated) and medication compliance to achieve or maintain control of  diabetes. -Will obtain CMP to check liver function, kidney function, and electrolytes in about 1 week. -Follow-up with primary physician in 3 months. - POCT glucose (manual entry) - POCT glycosylated hemoglobin (Hb A1C) - Comprehensive metabolic panel; Future - metFORMIN (GLUCOPHAGE) 1000 MG tablet; Take 1 tablet (1,000 mg total) by mouth 2 (two) times daily with a meal.  Dispense: 60 tablet; Refill: 2  2. Hyperlipidemia, unspecified hyperlipidemia type: -Continue Atorvastatin as prescribed.  -Practice low-fat diet and 150 minutes of moderate intensity exercise weekly as tolerated. -Will obtain lipid panel to evaluate cholesterol in about 1 week. Come to lab appointment fasting. -Follow-up with primary physician in 3 months. - Lipid Panel; Future - atorvastatin (LIPITOR) 40 MG tablet; Take 1 tablet (40 mg total) by mouth daily.  Dispense: 30 tablet; Refill: 2  3. Acquired hypothyroidism: -Continue Levothyroxine as prescribed.  -TSH to check thyroid function scheduled in about 1 week. -Follow-up with primary physician in 3 months. - TSH; Future - levothyroxine (SYNTHROID) 50 MCG tablet; Take 1.5 tablets (75 mcg total) by mouth daily.  Dispense: 45 tablet; Refill: 2  4. Encounter for screening mammogram for malignant neoplasm of breast: -Referral placed for mammogram. Offered patient Breast Scholarship application. Patient agreeable. - MM Digital Screening; Future  5. Colon cancer screening: -Patient will obtain stool sample from home for colon cancer screening. - Fecal occult blood, imunochemical([facility])  6. Language barrier: English as a second language teacher participated during this visit. Interpreter Name: Judson Roch, ID#: [id number]  Patient was given the opportunity to ask questions.  Patient verbalized understanding of the plan and was able to repeat key elements of the plan. Patient was given clear instructions to go to Emergency Department or return to medical center if symptoms don't  improve, worsen, or new problems develop.The patient verbalized understanding.  Camillia Herter, NP

## 2020-04-29 ENCOUNTER — Other Ambulatory Visit: Payer: Self-pay

## 2020-04-29 ENCOUNTER — Other Ambulatory Visit: Payer: Self-pay | Admitting: *Deleted

## 2020-04-29 DIAGNOSIS — Z1231 Encounter for screening mammogram for malignant neoplasm of breast: Secondary | ICD-10-CM

## 2020-05-02 LAB — FECAL OCCULT BLOOD, IMMUNOCHEMICAL: Fecal Occult Bld: NEGATIVE

## 2020-05-05 NOTE — Progress Notes (Signed)
Fecal occult blood negative. Please call patient with update.

## 2020-05-06 ENCOUNTER — Telehealth: Payer: Self-pay

## 2020-05-06 NOTE — Telephone Encounter (Signed)
[employer] [place]  Id# [id number]  contacted pt to go over Fit Test results pt didn't answer left a detailed vm informing pt of results

## 2020-05-06 NOTE — Telephone Encounter (Signed)
-----   Message from Camillia Herter, NP sent at 05/05/2020  8:04 AM EDT ----- Fecal occult blood negative. Please call patient with update.

## 2020-05-14 ENCOUNTER — Ambulatory Visit
Admission: RE | Admit: 2020-05-14 | Discharge: 2020-05-14 | Disposition: A | Discharge status: Home / Self Care | Payer: No Typology Code available for payment source | Source: Ambulatory Visit | Attending: Internal Medicine | Admitting: Internal Medicine

## 2020-05-14 ENCOUNTER — Other Ambulatory Visit: Payer: Self-pay

## 2020-05-14 DIAGNOSIS — Z1231 Encounter for screening mammogram for malignant neoplasm of breast: Secondary | ICD-10-CM

## 2020-05-22 ENCOUNTER — Other Ambulatory Visit: Payer: Self-pay | Admitting: Internal Medicine

## 2020-05-22 ENCOUNTER — Telehealth: Payer: Self-pay

## 2020-05-22 DIAGNOSIS — E119 Type 2 diabetes mellitus without complications: Secondary | ICD-10-CM

## 2020-05-22 MED FILL — ?LEVOTHYROXINE 50 MCG TABLE: 50 | 30 days supply | Qty: 45 | Fill #0

## 2020-05-22 MED FILL — metFORMIN HCL 1000 MG TABS: 1000 | 30 days supply | Qty: 60 | Fill #0

## 2020-05-22 MED FILL — ?ATORVASTATIN 40MG TABLET: 40 | 30 days supply | Qty: 30 | Fill #0

## 2020-05-22 MED FILL — TRUEplus LANCETS 28G MISC: 50 days supply | Qty: 100 | Fill #0

## 2020-05-22 MED FILL — TRUE METRIX TEST STRIP: 25 days supply | Qty: 100 | Fill #3

## 2020-05-22 NOTE — Telephone Encounter (Signed)
[employer] [place]  Id#  [id number] contacted pt to go over MM results pt didn't answer left a detailed vm informing pt of results and if she has any questions or concern tos give a call

## 2020-06-23 MED FILL — metFORMIN HCL 1000 MG TABS: 1000 | 30 days supply | Qty: 60 | Fill #1

## 2020-06-23 MED FILL — ?ATORVASTATIN 40MG TABLET: 40 | 30 days supply | Qty: 30 | Fill #1

## 2020-06-23 MED FILL — ?LEVOTHYROXINE 50 MCG TABLE: 50 | 30 days supply | Qty: 45 | Fill #1

## 2020-06-23 MED FILL — TRUE METRIX TEST STRIP: 25 days supply | Qty: 100 | Fill #4

## 2020-07-22 MED FILL — TRUE METRIX TEST STRIP: 25 days supply | Qty: 100 | Fill #5

## 2020-07-22 MED FILL — metFORMIN HCL 1000 MG TABS: 1000 | 30 days supply | Qty: 60 | Fill #2

## 2020-07-22 MED FILL — ATORVASTATIN CALCIUM 40 MG: 40 | 30 days supply | Qty: 30 | Fill #2

## 2020-07-22 MED FILL — ?LEVOTHYROXINE 50 MCG TABLE: 50 | 30 days supply | Qty: 45 | Fill #2

## 2020-07-24 ENCOUNTER — Other Ambulatory Visit: Payer: Self-pay | Admitting: Internal Medicine

## 2020-07-24 ENCOUNTER — Other Ambulatory Visit: Payer: Self-pay

## 2020-07-24 ENCOUNTER — Ambulatory Visit: Payer: Self-pay | Attending: Internal Medicine | Admitting: Internal Medicine

## 2020-07-24 ENCOUNTER — Ambulatory Visit (HOSPITAL_BASED_OUTPATIENT_CLINIC_OR_DEPARTMENT_OTHER): Payer: Self-pay | Admitting: Pharmacist

## 2020-07-24 DIAGNOSIS — Z23 Encounter for immunization: Secondary | ICD-10-CM

## 2020-07-24 DIAGNOSIS — E039 Hypothyroidism, unspecified: Secondary | ICD-10-CM

## 2020-07-24 DIAGNOSIS — E119 Type 2 diabetes mellitus without complications: Secondary | ICD-10-CM

## 2020-07-24 DIAGNOSIS — E785 Hyperlipidemia, unspecified: Secondary | ICD-10-CM

## 2020-07-24 DIAGNOSIS — D649 Anemia, unspecified: Secondary | ICD-10-CM

## 2020-07-24 MED ORDER — METFORMIN HCL 1000 MG PO TABS: 1000.0000 mg | ORAL_TABLET | Freq: Two times a day (BID) | ORAL | 6 refills | Status: DC

## 2020-07-24 MED ORDER — LEVOTHYROXINE SODIUM 50 MCG PO TABS: 75.0000 ug | ORAL_TABLET | Freq: Every day | ORAL | 6 refills | Status: DC

## 2020-07-24 MED ORDER — ATORVASTATIN CALCIUM 40 MG PO TABS: 40.0000 mg | ORAL_TABLET | Freq: Every day | ORAL | 6 refills | Status: DC

## 2020-07-24 NOTE — Progress Notes (Signed)
Pt states her blood sugar this morning was 107

## 2020-07-24 NOTE — Progress Notes (Signed)
Virtual Visit via Telephone Note Due to current restrictions/limitations of in-office visits due to the COVID-19 pandemic, this scheduled clinical appointment was converted to a telehealth visit  I connected with Cynthia Wiley on 07/24/20 at 3:37 p.m by telephone and verified that I am speaking with the correct person using two identifiers. I am in my office.  The patient is at home.  Only the patient, myself and  Carina from [employer] (#[id number])participated in this encounter.  I discussed the limitations, risks, security and privacy concerns of performing an evaluation and management service by telephone and the availability of in person appointments. I also discussed with the patient that there may be a patient responsible charge related to this service. The patient expressed understanding and agreed to proceed.  History of Present Illness: pt with hx of DM type 2, HL, iron defand hypothyroid.   Last seen May 2020 by my nurse practitioner.  Purpose of today's visit is chronic disease management.  DIABETES TYPE 2 Last A1C:   Med Adherence:  _0  Yes - Metformin.  Not taking Wilder Glade because it drops her BS too low.  Told NP about it on last visit.  Told to continue just with Metformin    Medication side effects:  _1  Yes    _2  No Home Monitoring?  _3  Yes 1-2 x a day Home glucose results range:  97-107 Diet Adherence: _4  Yes   Exercise: _5  Yes - 40 mins daily Hypoglycemic episodes?: _6  Yes    _7  No Numbness of the feet? _8  Yes    _9  No Retinopathy hx? _10  Yes    _11  No Last eye exam:  Comments  HL:  Compliant with Lipitor. Due for Lipid profile.  Ordered on last visit  Hypothyroid:  Taking Levothyroxine 50 mcg daily but refill in the system says 1.5 tab daily.  No hot/cold intolerance.  No fatigue or palpitations.  Due for TSH check  HM:  Completed COVID Pfizer vaccine Outpatient Encounter Medications as of 07/24/2020  Medication Sig  . aspirin EC 81 MG tablet  Take 1 tablet (81 mg total) by mouth daily.  Marland Kitchen atorvastatin (LIPITOR) 40 MG tablet Take 1 tablet (40 mg total) by mouth daily.  . Blood Glucose Monitoring Suppl (TRUE METRIX METER) w/Device KIT 1 each by Does not apply route as needed.  . dapagliflozin propanediol (FARXIGA) 5 MG TABS tablet Take 5 mg by mouth daily before breakfast. (Patient not taking: Reported on 04/24/2020)  . glucose blood (TRUE METRIX BLOOD GLUCOSE TEST) test strip Use as instructed  . levothyroxine (SYNTHROID) 50 MCG tablet Take 1.5 tablets (75 mcg total) by mouth daily.  . metFORMIN (GLUCOPHAGE) 1000 MG tablet Take 1 tablet (1,000 mg total) by mouth 2 (two) times daily with a meal.  . TRUEplus Lancets 28G MISC USE AS DIRECTED TWICE DAILY   No facility-administered encounter medications on file as of 07/24/2020.      Observations/Objective: Results for orders placed or performed in visit on 04/24/20  Fecal occult blood, imunochemical([facility])   Specimen: Stool   ST  Result Value Ref Range   Fecal Occult Bld Negative Negative  POCT glucose (manual entry)  Result Value Ref Range   POC Glucose 96 70 - 99 mg/dl  POCT glycosylated hemoglobin (Hb A1C)  Result Value Ref Range   Hemoglobin A1C 7.4 (A) 4.0 - 5.6 %   HbA1c POC (<> result, manual entry)     HbA1c, POC (prediabetic range)     HbA1c, POC (controlled  diabetic range)     Lab Results  Component Value Date   WBC 9.7 09/06/2019   HGB 11.8 09/06/2019   HCT 35.9 09/06/2019   MCV 82 09/06/2019   PLT 366 09/06/2019     Chemistry      Component Value Date/Time   NA 140 02/14/2019 1626   K 4.6 02/14/2019 1626   CL 102 02/14/2019 1626   CO2 22 02/14/2019 1626   BUN 11 02/14/2019 1626   CREATININE 0.79 02/14/2019 1626   CREATININE 0.61 02/07/2017 1626      Component Value Date/Time   CALCIUM 9.9 02/14/2019 1626   ALKPHOS 85 02/14/2019 1626   AST 23 02/14/2019 1626   ALT 17 02/14/2019 1626   BILITOT 0.5 02/14/2019 1626     Lab Results   Component Value Date   CHOL 128 02/14/2019   HDL 47 02/14/2019   LDLCALC 63 02/14/2019   TRIG 88 02/14/2019   CHOLHDL 2.7 02/14/2019     Assessment and Plan: 1. Type 2 diabetes mellitus without complication, without long-term current use of insulin (HCC) Continue Metformin, healthy eating habits and regular exercise.  She will come to the lab today to have A1c drawn. - metFORMIN (GLUCOPHAGE) 1000 MG tablet; Take 1 tablet (1,000 mg total) by mouth 2 (two) times daily with a meal.  Dispense: 60 tablet; Refill: 6 - CBC - Comprehensive metabolic panel - Lipid panel  2. Hyperlipidemia, unspecified hyperlipidemia type Continue atorvastatin. - atorvastatin (LIPITOR) 40 MG tablet; Take 1 tablet (40 mg total) by mouth daily.  Dispense: 30 tablet; Refill: 6  3. Acquired hypothyroidism Advised patient to continue taking the levothyroxine 50 mg 1 tablet daily even though the bottle says 1 1/2 tablet until we get the results back of her TSH - levothyroxine (SYNTHROID) 50 MCG tablet; Take 1.5 tablets (75 mcg total) by mouth daily.  Dispense: 45 tablet; Refill: 6 - TSH  4. need for influenza.  Patient will be coming to the lab today.  I will have the clinical pharmacist give her the flu vaccine while she is here.  Follow Up Instructions: 4 mths   I discussed the assessment and treatment plan with the patient. The patient was provided an opportunity to ask questions and all were answered. The patient agreed with the plan and demonstrated an understanding of the instructions.   The patient was advised to call back or seek an in-person evaluation if the symptoms worsen or if the condition fails to improve as anticipated.  I provided  17 minutes of non-face-to-face time during this encounter.   Karle Plumber, MD

## 2020-07-24 NOTE — Addendum Note (Signed)
Addended bySteffanie Dunn on: 07/24/2020 03:24 PM   Modules accepted: Orders

## 2020-07-24 NOTE — Progress Notes (Signed)
Patient presents for vaccination against influenza per orders of Dr. Johnson. Consent given. Counseling provided. No contraindications exists. Vaccine administered without incident.   

## 2020-07-25 LAB — LIPID PANEL
Chol/HDL Ratio: 2.7 ratio (ref 0.0–4.4)
Cholesterol, Total: 151 mg/dL (ref 100–199)
HDL: 55 mg/dL (ref >39)
LDL Chol Calc (NIH): 79 mg/dL (ref 0–99)
Triglycerides: 91 mg/dL (ref 0–149)
VLDL Cholesterol Cal: 17 mg/dL (ref 5–40)

## 2020-07-25 LAB — COMPREHENSIVE METABOLIC PANEL
ALT: 10 IU/L (ref 0–32)
AST: 19 IU/L (ref 0–40)
Albumin/Globulin Ratio: 1.7 (ref 1.2–2.2)
Albumin: 4.5 g/dL (ref 3.8–4.9)
Alkaline Phosphatase: 101 IU/L (ref 48–121)
BUN/Creatinine Ratio: 16 (ref 9–23)
BUN: 13 mg/dL (ref 6–24)
Bilirubin Total: 0.5 mg/dL (ref 0.0–1.2)
CO2: 22 mmol/L (ref 20–29)
Calcium: 9.7 mg/dL (ref 8.7–10.2)
Chloride: 102 mmol/L (ref 96–106)
Creatinine, Ser: 0.8 mg/dL (ref 0.57–1.00)
GFR calc Af Amer: 97 mL/min/{1.73_m2} (ref >59)
GFR calc non Af Amer: 84 mL/min/{1.73_m2} (ref >59)
Globulin, Total: 2.7 g/dL (ref 1.5–4.5)
Glucose: 92 mg/dL (ref 65–99)
Potassium: 4.2 mmol/L (ref 3.5–5.2)
Sodium: 139 mmol/L (ref 134–144)
Total Protein: 7.2 g/dL (ref 6.0–8.5)

## 2020-07-25 LAB — HEMOGLOBIN A1C
Est. average glucose Bld gHb Est-mCnc: 194 mg/dL
Hgb A1c MFr Bld: 8.4 % — ABNORMAL HIGH (ref 4.8–5.6)

## 2020-07-25 LAB — CBC
Hematocrit: 34.1 % (ref 34.0–46.6)
Hemoglobin: 10.3 g/dL — ABNORMAL LOW (ref 11.1–15.9)
MCH: 24.5 pg — ABNORMAL LOW (ref 26.6–33.0)
MCHC: 30.2 g/dL — ABNORMAL LOW (ref 31.5–35.7)
MCV: 81 fL (ref 79–97)
Platelets: 400 10*3/uL (ref 150–450)
RBC: 4.21 x10E6/uL (ref 3.77–5.28)
RDW: 14.2 % (ref 11.7–15.4)
WBC: 9.1 10*3/uL (ref 3.4–10.8)

## 2020-07-25 NOTE — Addendum Note (Signed)
Addended by: Karle Plumber B on: 07/25/2020 03:12 PM   Modules accepted: Orders

## 2020-07-25 NOTE — Progress Notes (Signed)
Let pt know that her cholesterol level is good.  Kidney and liver function tests nl.  She has a new mild anemia.  Will have lab check iron level to determine if this is iron deficiency..  A1C is 8.4 which is increase from 7.4 four months ago.  This means her diabetes is not well controlled.  Goal is to be less than 7.  I recommend restarting the [place] and taking 1/2 tablet daily.  Updated prescription sent to her pharmacy.

## 2020-07-28 LAB — SPECIMEN STATUS REPORT

## 2020-07-28 LAB — IRON,TIBC AND FERRITIN PANEL
Ferritin: 8 ng/mL — ABNORMAL LOW (ref 15–150)
Iron Saturation: 8 % — CL (ref 15–55)
Iron: 28 ug/dL (ref 27–159)
Total Iron Binding Capacity: 361 ug/dL (ref 250–450)
UIBC: 333 ug/dL (ref 131–425)

## 2020-07-29 ENCOUNTER — Other Ambulatory Visit: Payer: Self-pay | Admitting: Internal Medicine

## 2020-07-29 MED ORDER — FERROUS SULFATE 325 (65 FE) MG PO TABS: 325.0000 mg | ORAL_TABLET | Freq: Every day | ORAL | 1 refills | Status: DC

## 2020-07-29 MED FILL — FERROUS SULFATE 325 MG TAB: 325 (65 FE) | 30 days supply | Qty: 30 | Fill #0

## 2020-07-29 NOTE — Progress Notes (Signed)
Let patient know that she has developed iron deficiency anemia again.  We need to restart iron supplement.  I have sent the prescription to her pharmacy.  She should pick up as soon as possible and start taking.  Given the recurrence of the anemia, instead of doing the fit test for colon cancer screening, I recommend that we refer her to the gastroenterologist for colonoscopy.  Please pick up the forms for the orange card/cone discount card and fill out as soon as possible.  Once approved, please let me know so that I can submit the referral to the gastroenterologist.

## 2020-07-29 NOTE — Progress Notes (Signed)
Iron supplement

## 2020-08-01 ENCOUNTER — Telehealth: Payer: Self-pay

## 2020-08-01 NOTE — Telephone Encounter (Signed)
[employer] Cynthia Wiley UU#[id number]   contacted pt to go over lab results pt didn't answer lvm asking pt to give a call back on monday

## 2020-08-05 MED FILL — FERROUS SULFATE 325 MG TAB: 325 (65 FE) | 30 days supply | Qty: 30 | Fill #0

## 2020-08-21 ENCOUNTER — Other Ambulatory Visit: Payer: Self-pay

## 2020-08-21 ENCOUNTER — Ambulatory Visit: Payer: No Typology Code available for payment source | Attending: Internal Medicine

## 2020-08-24 MED FILL — FERROUS SULFATE 325 MG TAB: 325 (65 FE) | 30 days supply | Qty: 30 | Fill #1

## 2020-08-24 MED FILL — ?LEVOTHYROXINE 50 MCG TABLE: 50 | 30 days supply | Qty: 45 | Fill #0

## 2020-08-24 MED FILL — ATORVASTATIN CALCIUM 40 MG: 40 | 30 days supply | Qty: 30 | Fill #0

## 2020-08-24 MED FILL — metFORMIN HCL 1000 MG TABS: 1000 | 30 days supply | Qty: 60 | Fill #0

## 2020-08-25 ENCOUNTER — Telehealth: Payer: Self-pay | Admitting: Internal Medicine

## 2020-08-25 NOTE — Telephone Encounter (Signed)
I call the Pt to inform her that still missing the bank statement for June-July since she was given a duplicate, waiting for the missing documents to complete her CAFA application, LVM

## 2020-09-08 ENCOUNTER — Telehealth: Payer: Self-pay | Admitting: Internal Medicine

## 2020-09-08 NOTE — Telephone Encounter (Signed)
I call the pt since received an email from [employer] , that show Pt received Pay-stub, an requesting 3 months of them and she will have 14 day from today, pt was inform an she said that she will bring the missing documents this week

## 2020-09-28 MED FILL — FERROUS SULFATE 325 MG TAB: 325 (65 FE) | 30 days supply | Qty: 30 | Fill #2

## 2020-09-28 MED FILL — ?LEVOTHYROXINE 50 MCG TABLE: 50 | 30 days supply | Qty: 45 | Fill #1

## 2020-09-28 MED FILL — METFORMIN HCL 1000 MG TABS: 1000 | 30 days supply | Qty: 60 | Fill #1

## 2020-09-28 MED FILL — TRUE METRIX TEST STRIP: 25 days supply | Qty: 100 | Fill #6

## 2020-09-28 MED FILL — ?ATORVASTATIN 40MG TABLET: 40 | 30 days supply | Qty: 30 | Fill #1

## 2020-09-28 MED FILL — TRUEplus LANCETS 28G MISC: 50 days supply | Qty: 100 | Fill #1

## 2020-10-23 MED FILL — ?LEVOTHYROXINE 50 MCG TABLE: 50 | 30 days supply | Qty: 45 | Fill #2

## 2020-10-23 MED FILL — ?ATORVASTATIN 40MG TABLET: 40 | 30 days supply | Qty: 30 | Fill #2

## 2020-10-23 MED FILL — FERROUS SULFATE 325 MG TAB: 325 (65 FE) | 30 days supply | Qty: 30 | Fill #3

## 2020-10-23 MED FILL — METFORMIN HCL 1000 MG TABS: 1000 | 30 days supply | Qty: 60 | Fill #2

## 2020-11-23 ENCOUNTER — Ambulatory Visit: Payer: No Typology Code available for payment source | Admitting: Internal Medicine

## 2020-11-23 MED FILL — METFORMIN HCL 1000 MG TABS: 1000 | 30 days supply | Qty: 60 | Fill #3

## 2020-11-23 MED FILL — FERROUS SULFATE 325 MG TAB: 325 (65 FE) | 30 days supply | Qty: 30 | Fill #4

## 2020-11-23 MED FILL — LEVOTHYROXINE SODIUM 50 MCG: 50 | 30 days supply | Qty: 45 | Fill #3

## 2020-11-23 MED FILL — ?ATORVASTATIN 40MG TABLET: 40 | 30 days supply | Qty: 30 | Fill #3

## 2020-12-23 MED FILL — ?ATORVASTATIN 40MG TABLET: 40 | 30 days supply | Qty: 30 | Fill #4

## 2020-12-23 MED FILL — TRUEplus LANCETS 28G MISC: 50 days supply | Qty: 100 | Fill #2

## 2020-12-23 MED FILL — FERROUS SULFATE 325 MG TAB: 325 (65 FE) | 30 days supply | Qty: 30 | Fill #5

## 2020-12-23 MED FILL — LEVOTHYROXINE SODIUM 50 MCG: 50 | 30 days supply | Qty: 45 | Fill #4

## 2020-12-23 MED FILL — METFORMIN HCL 1000 MG TABS: 1000 | 30 days supply | Qty: 60 | Fill #4

## 2021-01-01 ENCOUNTER — Ambulatory Visit: Payer: Self-pay | Attending: Internal Medicine | Admitting: Internal Medicine

## 2021-01-01 ENCOUNTER — Other Ambulatory Visit: Payer: Self-pay

## 2021-01-01 ENCOUNTER — Other Ambulatory Visit: Payer: Self-pay | Admitting: Internal Medicine

## 2021-01-01 ENCOUNTER — Encounter: Payer: Self-pay | Admitting: Internal Medicine

## 2021-01-01 VITALS — BP 125/75 | HR 74 | Temp 98.4°F | Resp 16 | Wt 141.8 lb

## 2021-01-01 DIAGNOSIS — E119 Type 2 diabetes mellitus without complications: Secondary | ICD-10-CM

## 2021-01-01 DIAGNOSIS — E039 Hypothyroidism, unspecified: Secondary | ICD-10-CM

## 2021-01-01 DIAGNOSIS — E1169 Type 2 diabetes mellitus with other specified complication: Secondary | ICD-10-CM

## 2021-01-01 DIAGNOSIS — E611 Iron deficiency: Secondary | ICD-10-CM

## 2021-01-01 DIAGNOSIS — E785 Hyperlipidemia, unspecified: Secondary | ICD-10-CM

## 2021-01-01 LAB — POCT GLYCOSYLATED HEMOGLOBIN (HGB A1C): HbA1c, POC (controlled diabetic range): 7.7 % — AB (ref 0.0–7.0)

## 2021-01-01 LAB — GLUCOSE, POCT (MANUAL RESULT ENTRY): POC Glucose: 89 mg/dl (ref 70–99)

## 2021-01-01 MED ORDER — TRUE METRIX BLOOD GLUCOSE TEST VI STRP
ORAL_STRIP | 11 refills | Status: DC
Start: 2021-01-01 — End: 2021-01-01

## 2021-01-01 MED ORDER — METFORMIN HCL 1000 MG PO TABS: 1000.0000 mg | ORAL_TABLET | Freq: Two times a day (BID) | ORAL | 6 refills | Status: DC

## 2021-01-01 NOTE — Progress Notes (Signed)
Pt states she has a burning sensation in her right foot

## 2021-01-01 NOTE — Progress Notes (Signed)
Patient ID: Cynthia Wiley, female    DOB: May 06, 1966  MRN: [id number]  CC: Diabetes   Subjective: Cynthia Wiley is a 55 y.o. female who presents for chronic ds management Her concerns today include:  pt with hx of DM type 2, HL, iron defand hypothyroid  DIABETES TYPE 2 Last A1C:   Results for orders placed or performed in visit on 01/01/21  POCT glucose (manual entry)  Result Value Ref Range   POC Glucose 89 70 - 99 mg/dl  POCT glycosylated hemoglobin (Hb A1C)  Result Value Ref Range   Hemoglobin A1C     HbA1c POC (<> result, manual entry)     HbA1c, POC (prediabetic range)     HbA1c, POC (controlled diabetic range) 7.7 (A) 0.0 - 7.0 %    Med Adherence:  '[x]'  Yes .  Taking only Metformin.  Never restarted low dose Farxiga as recommended after lab results from last visit showed A1C 8.4 Medication side effects:  '[]'  Yes    '[x]'  No Home Monitoring?  '[x]'  Yes  Every morning Home glucose results range:99-105 in a.m before BF; bedtime 130-140 Diet Adherence: '[x]'  Yes    '[]'  No Exercise: '[x]'  Yes  But not as much due to cold weather Hypoglycemic episodes?: '[]'  Yes    '[x]'  No Numbness of the feet? '[]'  Yes    '[x]'  No Retinopathy hx? '[]'  Yes    '[]'  No Last eye exam:  Comments: RT small toe is red x 1 mth.  Not painful  HL:  Taking and tolerating Lipitor  Thyroid:  Taking Levothyroxine consistently. Denies any major weight changes. No feelings of being hot or cold all the time.  Anemia:  Taking iron supplement daily.  No dizziness.  HM: Completed COVID booster Patient Active Problem List   Diagnosis Date Noted  . Anemia 02/14/2019  . Hyperlipidemia 08/07/2017  . DM2 (diabetes mellitus, type 2) ([place]) 01/22/2015  . Hypothyroidism 01/22/2015     Current Outpatient Medications on File Prior to Visit  Medication Sig Dispense Refill  . aspirin EC 81 MG tablet Take 1 tablet (81 mg total) by mouth daily. 100 tablet 1  . atorvastatin (LIPITOR) 40 MG tablet Take 1 tablet (40  mg total) by mouth daily. 30 tablet 6  . Blood Glucose Monitoring Suppl (TRUE METRIX METER) w/Device KIT 1 each by Does not apply route as needed. 1 kit 0  . ferrous sulfate 325 (65 FE) MG tablet Take 1 tablet (325 mg total) by mouth daily with breakfast. 100 tablet 1  . levothyroxine (SYNTHROID) 50 MCG tablet Take 1.5 tablets (75 mcg total) by mouth daily. 45 tablet 6  . TRUEplus Lancets 28G MISC USE AS DIRECTED TWICE DAILY 100 each 11   No current facility-administered medications on file prior to visit.    Allergies  Allergen Reactions  . Onglyza [Saxagliptin] Nausea Only  . Sulfonylureas Other (See Comments)    Severe stomach ache with Amaryl and glipizide XL     Social History   Socioeconomic History  . Marital status: Single    Spouse name: Not on file  . Number of children: Not on file  . Years of education: Not on file  . Highest education level: Not on file  Occupational History  . Not on file  Tobacco Use  . Smoking status: Never Smoker  . Smokeless tobacco: Never Used  Vaping Use  . Vaping Use: Never used  Substance and Sexual Activity  . Alcohol use:  No  . Drug use: No  . Sexual activity: Yes    Birth control/protection: None  Other Topics Concern  . Not on file  Social History Narrative  . Not on file   Social Determinants of Health   Financial Resource Strain: Not on file  Food Insecurity: Not on file  Transportation Needs: Not on file  Physical Activity: Not on file  Stress: Not on file  Social Connections: Not on file  Intimate Partner Violence: Not on file    Family History  Problem Relation Age of Onset  . Diabetes Father   . Heart disease Father   . Diabetes Sister   . Hyperlipidemia Brother     Past Surgical History:  Procedure Laterality Date  . ABDOMINAL HYSTERECTOMY N/A 01/31/2013   Procedure: HYSTERECTOMY ABDOMINAL;  Surgeon: Lavonia Drafts, MD;  Location: [facility];  Service: Gynecology;  Laterality: N/A;  . BILATERAL  SALPINGECTOMY Bilateral 01/31/2013   Procedure: BILATERAL SALPINGECTOMY;  Surgeon: Lavonia Drafts, MD;  Location: [facility];  Service: Gynecology;  Laterality: Bilateral;  . CYSTOSCOPY WITH STENT PLACEMENT Bilateral 01/31/2013   Procedure: CYSTOSCOPY WITH STENT PLACEMENT;  Surgeon: Malka So, MD;  Location: [facility];  Service: Urology;  Laterality: Bilateral;  with c-arm    ROS: Review of Systems Negative except as stated above  PHYSICAL EXAM: BP 125/75   Pulse 74   Temp 98.4 F (36.9 C)   Resp 16   Wt 141 lb 12.8 oz (64.3 kg)   LMP 01/19/2013   SpO2 100%   BMI 25.94 kg/m   Wt Readings from Last 3 Encounters:  01/01/21 141 lb 12.8 oz (64.3 kg)  04/24/20 137 lb (62.1 kg)  12/20/19 139 lb 12.8 oz (63.4 kg)    Physical Exam  General appearance - alert, well appearing, and in no distress Mental status - normal mood, behavior, speech, dress, motor activity, and thought processes Neck - supple, no significant adenopathy Chest - clear to auscultation, no wheezes, rales or rhonchi, symmetric air entry Heart - normal rate, regular rhythm, normal S1, S2, no murmurs, rubs, clicks or gallops Extremities - peripheral pulses normal, no pedal edema, no clubbing or cyanosis Diabetic Foot Exam - Simple   Simple Foot Form Visual Inspection See comments: Yes Sensation Testing Intact to touch and monofilament testing bilaterally: Yes Pulse Check Posterior Tibialis and Dorsalis pulse intact bilaterally: Yes Comments Small callus on the lateral aspect of the right fourth toe. The right fifth toe appears normal without erythema and has good range of motion.      CMP Latest Ref Rng & Units 07/24/2020 02/14/2019 02/05/2018  Glucose 65 - 99 mg/dL 92 81 90  BUN 6 - 24 mg/dL '13 11 12  ' Creatinine 0.57 - 1.00 mg/dL 0.80 0.79 0.75  Sodium 134 - 144 mmol/L 139 140 141  Potassium 3.5 - 5.2 mmol/L 4.2 4.6 4.7  Chloride 96 - 106 mmol/L 102 102 101  CO2 20 - 29 mmol/L '22 22 23  ' Calcium 8.7 -  10.2 mg/dL 9.7 9.9 9.6  Total Protein 6.0 - 8.5 g/dL 7.2 7.1 7.5  Total Bilirubin 0.0 - 1.2 mg/dL 0.5 0.5 0.4  Alkaline Phos 48 - 121 IU/L 101 85 99  AST 0 - 40 IU/L '19 23 14  ' ALT 0 - 32 IU/L '10 17 12   ' Lipid Panel     Component Value Date/Time   CHOL 151 07/24/2020 1526   TRIG 91 07/24/2020 1526   HDL 55 07/24/2020 1526  CHOLHDL 2.7 07/24/2020 1526   CHOLHDL 2.7 02/07/2017 1626   VLDL 17 02/07/2017 1626   LDLCALC 79 07/24/2020 1526    CBC    Component Value Date/Time   WBC 9.1 07/24/2020 1526   WBC 7.1 01/22/2015 1047   RBC 4.21 07/24/2020 1526   RBC 4.73 01/22/2015 1047   HGB 10.3 (L) 07/24/2020 1526   HCT 34.1 07/24/2020 1526   PLT 400 07/24/2020 1526   MCV 81 07/24/2020 1526   MCH 24.5 (L) 07/24/2020 1526   MCH 26.8 01/22/2015 1047   MCHC 30.2 (L) 07/24/2020 1526   MCHC 32.0 01/22/2015 1047   RDW 14.2 07/24/2020 1526   LYMPHSABS 2.4 01/06/2009 2230   MONOABS 0.6 01/06/2009 2230   EOSABS 0.1 01/06/2009 2230   BASOSABS 0.0 01/06/2009 2230    ASSESSMENT AND PLAN:  1. Type 2 diabetes mellitus without complication, without long-term current use of insulin (HCC) A1c not at goal but improved. Encouraged her to continue healthy eating habits and regular exercise. We discussed adding another medication to help get her to goal. Patient declines at this time. She feels she would be able to get her A1c to goal with increase exercise activity. We discussed some exercises that she can do indoors. Encouraged her to get her eye exam done when she is able to afford. - POCT glucose (manual entry) - POCT glycosylated hemoglobin (Hb A1C) - Microalbumin / creatinine urine ratio - glucose blood (TRUE METRIX BLOOD GLUCOSE TEST) test strip; Use as instructed  Dispense: 100 each; Refill: 11 - metFORMIN (GLUCOPHAGE) 1000 MG tablet; Take 1 tablet (1,000 mg total) by mouth 2 (two) times daily with a meal.  Dispense: 60 tablet; Refill: 6  2. Hyperlipidemia, unspecified hyperlipidemia  type Continue atorvastatin  3. Acquired hypothyroidism Continue levothyroxine - TSH  4. Iron deficiency Check CBC today - CBC    Patient was given the opportunity to ask questions.  Patient verbalized understanding of the plan and was able to repeat key elements of the plan.  [employer] interpreter used during this encounter. #[id number]  Orders Placed This Encounter  Procedures  . Microalbumin / creatinine urine ratio  . CBC  . TSH  . POCT glucose (manual entry)  . POCT glycosylated hemoglobin (Hb A1C)     Requested Prescriptions   Signed Prescriptions Disp Refills  . glucose blood (TRUE METRIX BLOOD GLUCOSE TEST) test strip 100 each 11    Sig: Use as instructed  . metFORMIN (GLUCOPHAGE) 1000 MG tablet 60 tablet 6    Sig: Take 1 tablet (1,000 mg total) by mouth 2 (two) times daily with a meal.    Return in about 4 months (around 05/01/2021).  Karle Plumber, MD, FACP

## 2021-01-02 LAB — CBC
Hematocrit: 39 % (ref 34.0–46.6)
Hemoglobin: 12.8 g/dL (ref 11.1–15.9)
MCH: 27.8 pg (ref 26.6–33.0)
MCHC: 32.8 g/dL (ref 31.5–35.7)
MCV: 85 fL (ref 79–97)
Platelets: 315 10*3/uL (ref 150–450)
RBC: 4.6 x10E6/uL (ref 3.77–5.28)
RDW: 13.6 % (ref 11.7–15.4)
WBC: 9.7 10*3/uL (ref 3.4–10.8)

## 2021-01-02 LAB — MICROALBUMIN / CREATININE URINE RATIO
Creatinine, Urine: 64.9 mg/dL
Microalb/Creat Ratio: 7 mg/g creat (ref 0–29)
Microalbumin, Urine: 4.7 ug/mL

## 2021-01-02 LAB — TSH: TSH: 1.19 u[IU]/mL (ref 0.450–4.500)

## 2021-01-04 MED FILL — TRUE METRIX GLUCOSE TEST ST: 25 days supply | Qty: 100 | Fill #0

## 2021-01-23 IMAGING — MG DIGITAL SCREENING BILAT W/ TOMO W/ CAD
8 series · 9 of 24 positions shown · non-contrast
Comparison: Previous exam(s).

CLINICAL DATA: Screening.

EXAM:
DIGITAL SCREENING BILATERAL MAMMOGRAM WITH TOMO AND CAD

[L CC synth-2D]
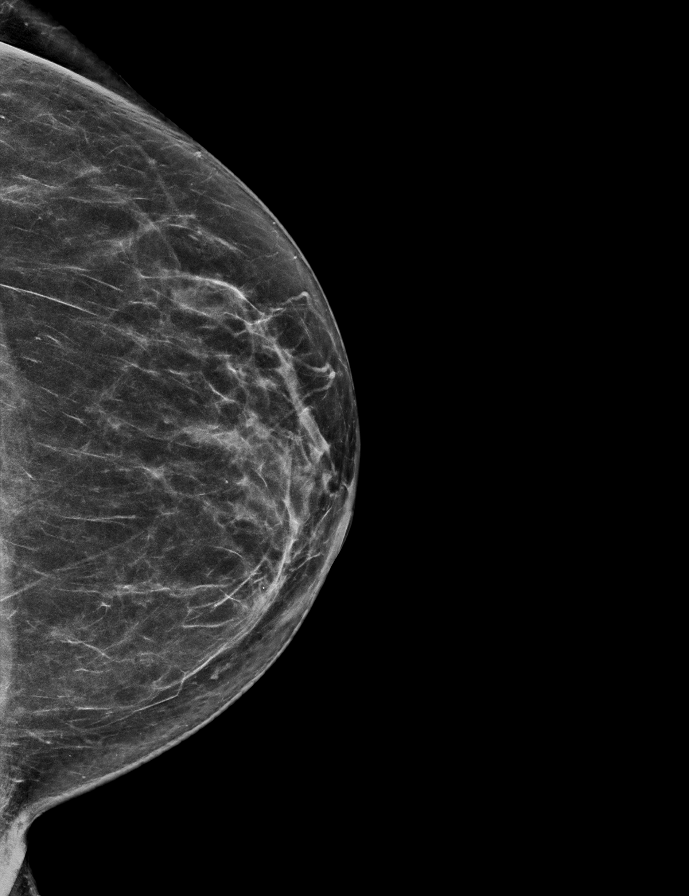

[R MLO synth-2D]
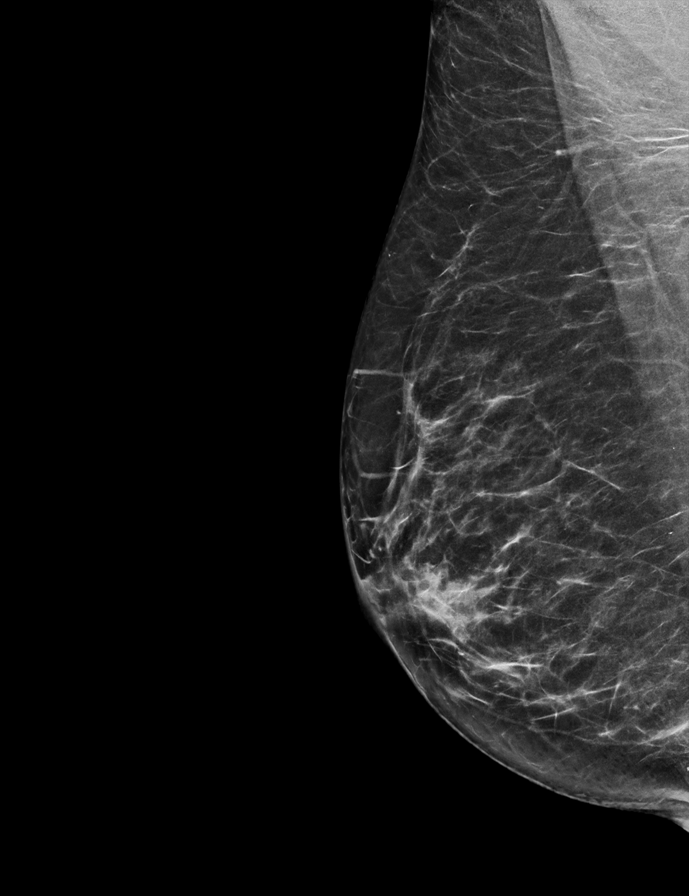

[R CC synth-2D]
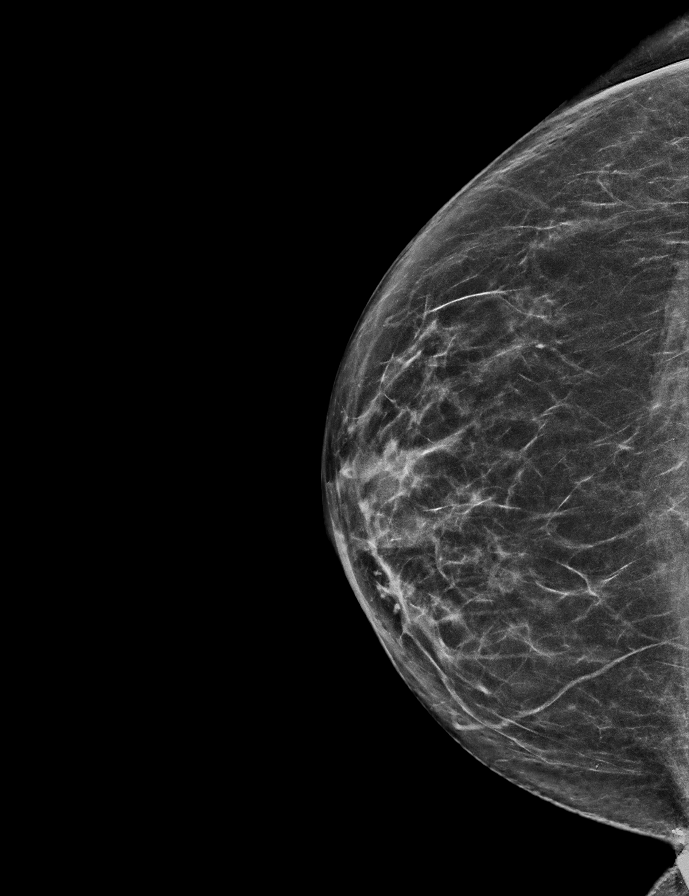

[L MLO synth-2D]
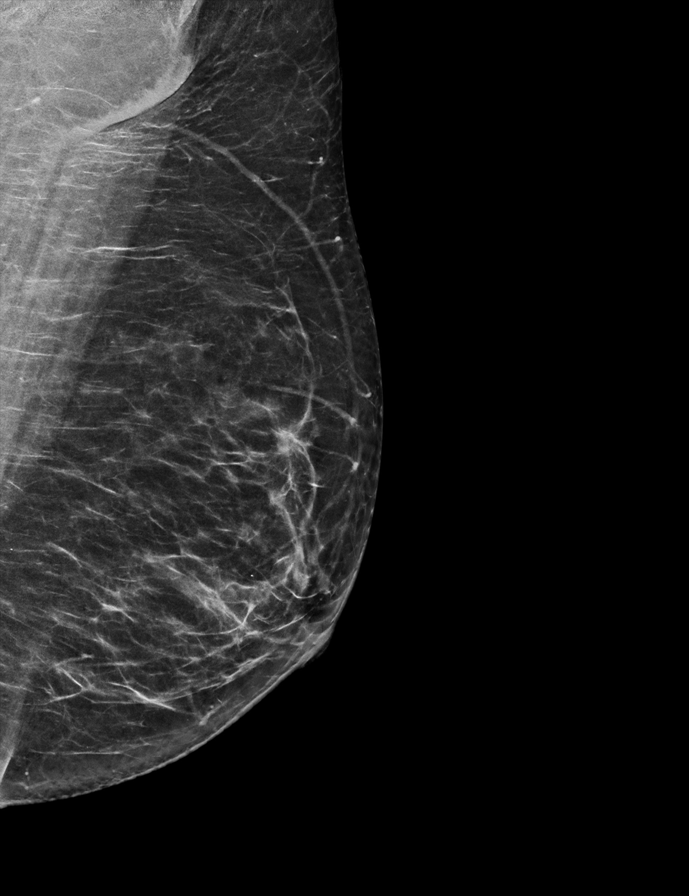

[L MLO tomo · 2 of 60 frames shown]
[frame 20/60]
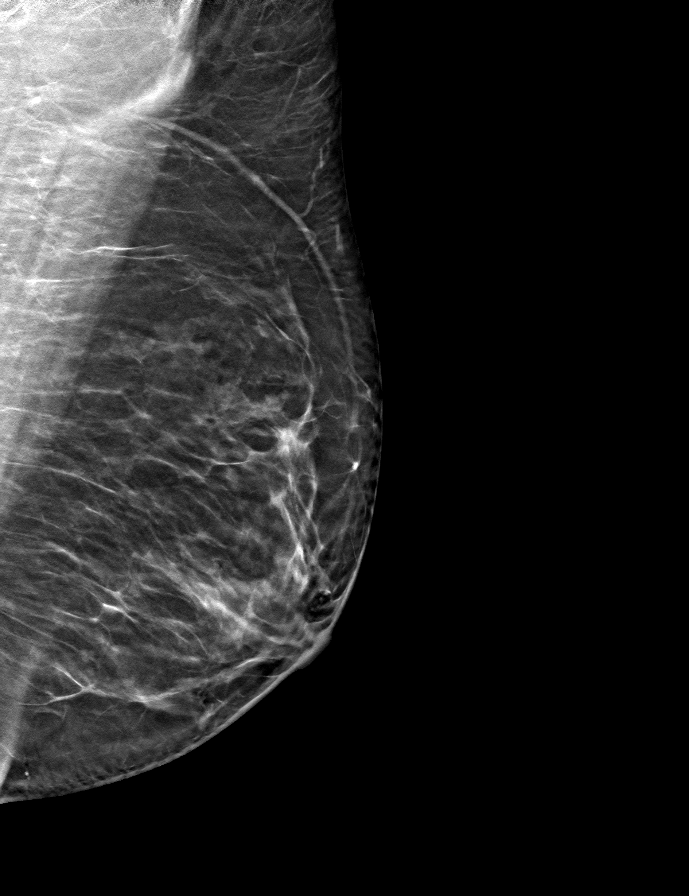
[frame 31/60]
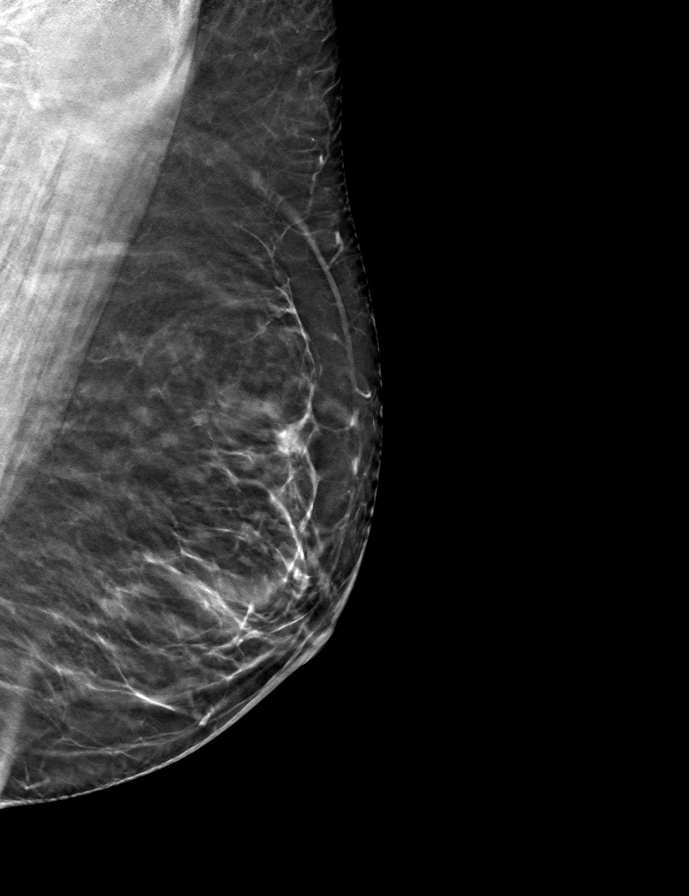

[R MLO tomo · tomo slice 31/61.0]
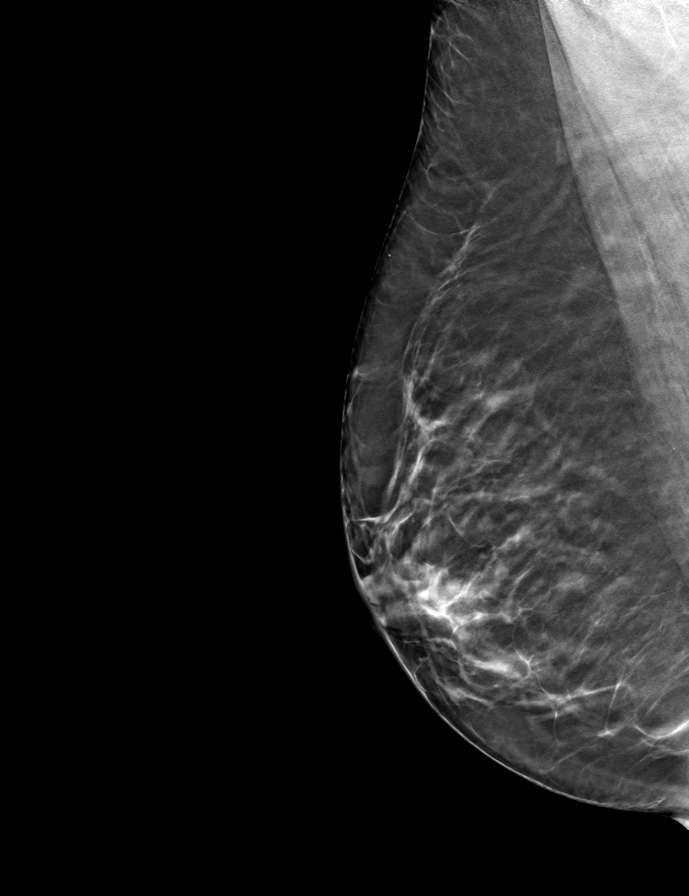

[R CC tomo · tomo slice 30/59.0]
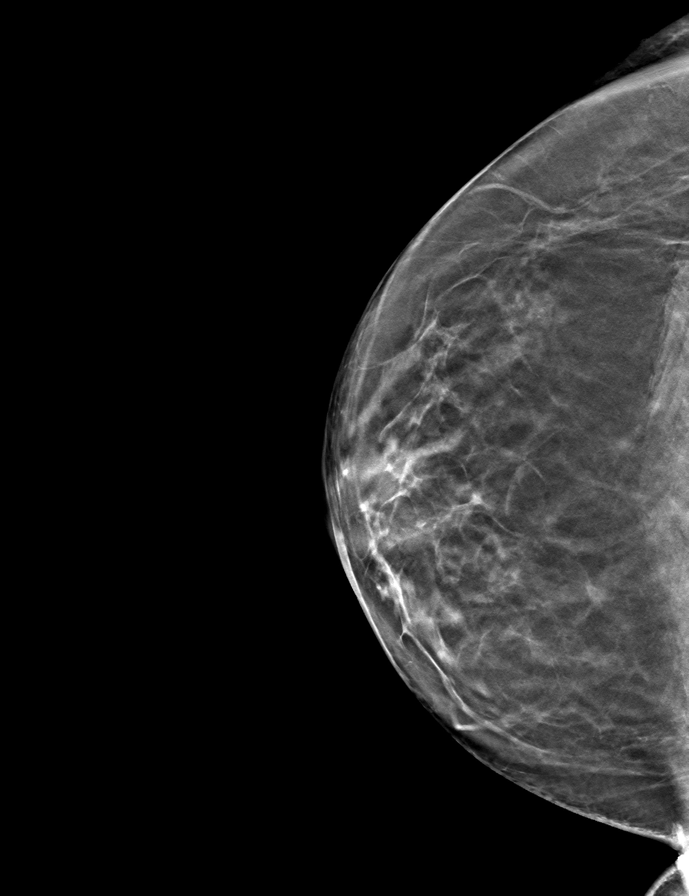

[L CC tomo · tomo slice 33/64.0]
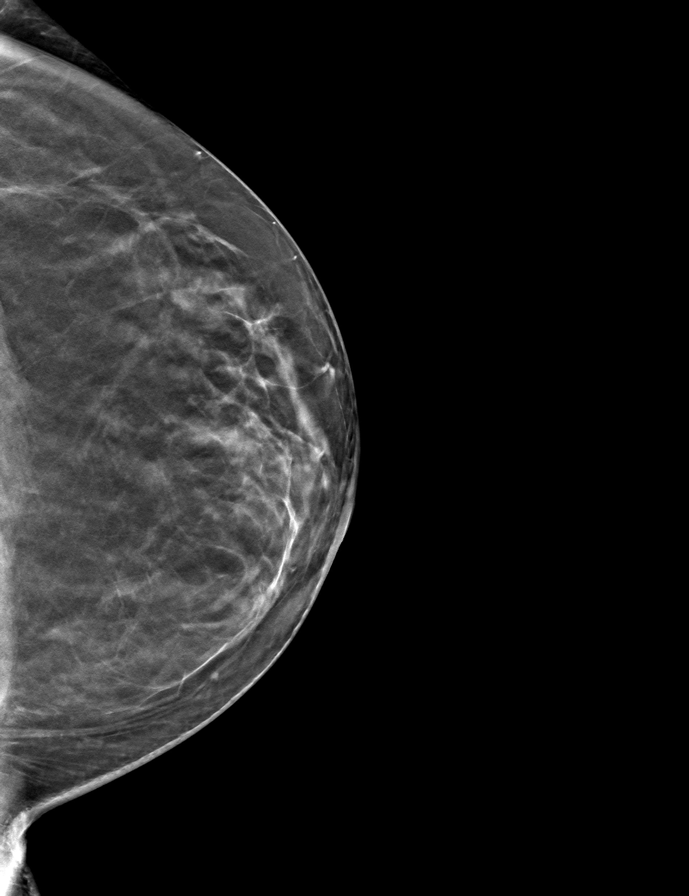

[9 of 24 positions shown; findings below may reference images not displayed]

ACR Breast Density Category b: There are scattered areas of
fibroglandular density.
FINDINGS: There are no findings suspicious for malignancy. Images were
processed with CAD.
IMPRESSION: No mammographic evidence of malignancy. A result letter of this
screening mammogram will be mailed directly to the patient.

RECOMMENDATION:
Screening mammogram in one year. (Code:[id number])

BI-RADS CATEGORY  1: Negative.

## 2021-01-25 ENCOUNTER — Other Ambulatory Visit: Payer: Self-pay | Admitting: Internal Medicine

## 2021-01-25 MED FILL — METFORMIN HCL 1000 MG TABS: 1000 | 30 days supply | Qty: 60 | Fill #5

## 2021-01-25 MED FILL — TRUEplus LANCETS 28G MISC: 50 days supply | Qty: 100 | Fill #3

## 2021-01-25 MED FILL — ?ATORVASTATIN 40MG TABLET: 40 | 30 days supply | Qty: 30 | Fill #5

## 2021-01-25 MED FILL — FERROUS SULFATE 325 MG TAB: 325 (65 FE) | 30 days supply | Qty: 30 | Fill #0

## 2021-01-25 MED FILL — LEVOTHYROXINE SODIUM 50 MCG: 50 | 30 days supply | Qty: 45 | Fill #5

## 2021-02-22 MED FILL — METFORMIN HCL 1000 MG TABS: 1000 | 30 days supply | Qty: 60 | Fill #6

## 2021-03-06 ENCOUNTER — Other Ambulatory Visit: Payer: Self-pay

## 2021-03-24 ENCOUNTER — Other Ambulatory Visit: Payer: Self-pay | Admitting: Internal Medicine

## 2021-03-24 ENCOUNTER — Other Ambulatory Visit: Payer: Self-pay

## 2021-03-24 DIAGNOSIS — E039 Hypothyroidism, unspecified: Secondary | ICD-10-CM

## 2021-03-24 DIAGNOSIS — E785 Hyperlipidemia, unspecified: Secondary | ICD-10-CM

## 2021-03-24 MED ORDER — LEVOTHYROXINE SODIUM 50 MCG PO TABS
ORAL_TABLET | ORAL | 6 refills | Status: DC
  Filled 2021-03-24: qty 45, 30d supply, fill #0
  Filled 2021-04-23: qty 45, 30d supply, fill #1
  Filled 2021-05-25: qty 45, 30d supply, fill #2
  Filled 2021-06-22: qty 45, 30d supply, fill #3
  Filled 2021-07-26: qty 45, 30d supply, fill #4
  Filled 2021-08-24: qty 45, 30d supply, fill #5
  Filled 2021-09-21: qty 45, 30d supply, fill #6

## 2021-03-24 MED ORDER — ATORVASTATIN CALCIUM 40 MG PO TABS
ORAL_TABLET | Freq: Every day | ORAL | 1 refills | Status: DC
  Filled 2021-03-24: qty 30, 30d supply, fill #0
  Filled 2021-04-23: qty 30, 30d supply, fill #1

## 2021-03-24 MED FILL — Lancets: 50 days supply | Qty: 100 | Fill #0 | Status: AC

## 2021-03-24 MED FILL — Ferrous Sulfate Tab 325 MG (65 MG Elemental Fe): ORAL | 30 days supply | Qty: 30 | Fill #0 | Status: AC

## 2021-03-25 ENCOUNTER — Other Ambulatory Visit: Payer: Self-pay

## 2021-03-25 MED FILL — Metformin HCl Tab 1000 MG: ORAL | 30 days supply | Qty: 60 | Fill #0 | Status: AC

## 2021-03-26 ENCOUNTER — Other Ambulatory Visit: Payer: Self-pay

## 2021-04-23 ENCOUNTER — Other Ambulatory Visit: Payer: Self-pay

## 2021-04-23 MED FILL — Glucose Blood Test Strip: 25 days supply | Qty: 100 | Fill #0 | Status: AC

## 2021-04-23 MED FILL — Metformin HCl Tab 1000 MG: ORAL | 30 days supply | Qty: 60 | Fill #1 | Status: AC

## 2021-04-26 ENCOUNTER — Other Ambulatory Visit: Payer: Self-pay

## 2021-05-07 ENCOUNTER — Encounter: Payer: Self-pay | Admitting: Internal Medicine

## 2021-05-07 ENCOUNTER — Other Ambulatory Visit: Payer: Self-pay

## 2021-05-07 ENCOUNTER — Ambulatory Visit: Payer: Self-pay | Attending: Internal Medicine | Admitting: Internal Medicine

## 2021-05-07 VITALS — BP 120/64 | HR 67 | Resp 16 | Wt 137.8 lb

## 2021-05-07 DIAGNOSIS — E039 Hypothyroidism, unspecified: Secondary | ICD-10-CM

## 2021-05-07 DIAGNOSIS — E785 Hyperlipidemia, unspecified: Secondary | ICD-10-CM

## 2021-05-07 DIAGNOSIS — E611 Iron deficiency: Secondary | ICD-10-CM

## 2021-05-07 DIAGNOSIS — Z1211 Encounter for screening for malignant neoplasm of colon: Secondary | ICD-10-CM

## 2021-05-07 DIAGNOSIS — Z23 Encounter for immunization: Secondary | ICD-10-CM

## 2021-05-07 DIAGNOSIS — E119 Type 2 diabetes mellitus without complications: Secondary | ICD-10-CM

## 2021-05-07 DIAGNOSIS — Z1159 Encounter for screening for other viral diseases: Secondary | ICD-10-CM

## 2021-05-07 DIAGNOSIS — E1169 Type 2 diabetes mellitus with other specified complication: Secondary | ICD-10-CM

## 2021-05-07 LAB — GLUCOSE, POCT (MANUAL RESULT ENTRY): POC Glucose: 100 mg/dl — AB (ref 70–99)

## 2021-05-07 LAB — POCT GLYCOSYLATED HEMOGLOBIN (HGB A1C): HbA1c, POC (controlled diabetic range): 7.7 % — AB (ref 0.0–7.0)

## 2021-05-07 NOTE — Patient Instructions (Signed)
Zoster Vaccine, Recombinant injection What is this medicine? ZOSTER VACCINE (ZOS ter vak SEEN) is a vaccine used to reduce the risk of getting shingles. This vaccine is not used to treat shingles or nerve pain from shingles. This medicine may be used for other purposes; ask your health care provider or pharmacist if you have questions. COMMON BRAND NAME(S): [facility] What should I tell my health care provider before I take this medicine? They need to know if you have any of these conditions:  cancer  immune system problems  an unusual or allergic reaction to Zoster vaccine, other medications, foods, dyes, or preservatives  pregnant or trying to get pregnant  breast-feeding How should I use this medicine? This vaccine is injected into a muscle. It is given by a health care provider. A copy of Vaccine Information Statements will be given before each vaccination. Be sure to read this information carefully each time. This sheet may change often. Talk to your health care provider about the use of this vaccine in children. This vaccine is not approved for use in children. Overdosage: If you think you have taken too much of this medicine contact a poison control center or emergency room at once. NOTE: This medicine is only for you. Do not share this medicine with others. What if I miss a dose? Keep appointments for follow-up (booster) doses. It is important not to miss your dose. Call your health care provider if you are unable to keep an appointment. What may interact with this medicine?  medicines that suppress your immune system  medicines to treat cancer  steroid medicines like prednisone or cortisone This list may not describe all possible interactions. Give your health care provider a list of all the medicines, herbs, non-prescription drugs, or dietary supplements you use. Also tell them if you smoke, drink alcohol, or use illegal drugs. Some items may interact with your medicine. What  should I watch for while using this medicine? Visit your health care provider regularly. This vaccine, like all vaccines, may not fully protect everyone. What side effects may I notice from receiving this medicine? Side effects that you should report to your doctor or health care professional as soon as possible:  allergic reactions (skin rash, itching or hives; swelling of the face, lips, or tongue)  trouble breathing Side effects that usually do not require medical attention (report these to your doctor or health care professional if they continue or are bothersome):  chills  headache  fever  nausea  pain, redness, or irritation at site where injected  tiredness  vomiting This list may not describe all possible side effects. Call your doctor for medical advice about side effects. You may report side effects to FDA at 1-800-FDA-1088. Where should I keep my medicine? This vaccine is only given by a health care provider. It will not be stored at home. NOTE: This sheet is a summary. It may not cover all possible information. If you have questions about this medicine, talk to your doctor, pharmacist, or health care provider.  2021 Elsevier/Gold Standard (2019-12-27 16:23:07)

## 2021-05-07 NOTE — Progress Notes (Signed)
Patient ID: Cynthia Wiley, female    DOB: [birth year]  MRN: [id number]  [id number] ds management  Subjective: Cynthia Wiley is a 55 y.o. female who presents for chronic ds management Her concerns today include:  pt with hx of DM type 2, HL, iron defand hypothyroid  DIABETES TYPE 2 Last A1C:   Results for orders placed or performed in visit on 05/07/21  POCT glucose (manual entry)  Result Value Ref Range   POC Glucose 100 (A) 70 - 99 mg/dl  POCT glycosylated hemoglobin (Hb A1C)  Result Value Ref Range   Hemoglobin A1C     HbA1c POC (<> result, manual entry)     HbA1c, POC (prediabetic range)     HbA1c, POC (controlled diabetic range) 7.7 (A) 0.0 - 7.0 %    Med Adherence:  _0  Yes  -on Metformin BID Medication side effects:  _1  Yes    _2  No Home Monitoring?  _3  Yes    _4  No Home glucose results range: in a.m before BF 100-132 and at bedtime, highest 125.  Diet Adherence: _5  Yes    _6  No Exercise: _7  Yes -walks daily for 30-40 mins Hypoglycemic episodes?: _8  Yes    _9  No Numbness of the feet? _10  Yes    _11  No Retinopathy hx? _12  Yes    _13  No Last eye exam: no blurred vision Comments:   HL:  Taking and tolerating Lipitor  Hypothyroid:  Compliant with taking Levothyroxine  Anemia:  H/H 12.8/39 in 12/2020.  Pt was told to cut back on taking iron supplement to 3-4 x a wk but she tells me she stopped taking it completely 1 mth ago.    HM:  Due for colon CA screening.  Due for eye exam but cannot afford.  Due for shingles vaccine. Patient Active Problem List   Diagnosis Date Noted  . Anemia 02/14/2019  . Hyperlipidemia 08/07/2017  . DM2 (diabetes mellitus, type 2) ([place]) 01/22/2015  . Hypothyroidism 01/22/2015     Current Outpatient Medications on File Prior to Visit  Medication Sig Dispense Refill  . aspirin EC 81 MG tablet Take 1 tablet (81 mg total) by mouth daily. 100 tablet 1  . atorvastatin (LIPITOR) 40 MG tablet TAKE 1 TABLET (40 MG TOTAL) BY  MOUTH DAILY. 30 tablet 1  . Blood Glucose Monitoring Suppl (TRUE METRIX METER) w/Device KIT 1 each by Does not apply route as needed. 1 kit 0  . ferrous sulfate 325 (65 FE) MG tablet TAKE 1 TABLET (325 MG TOTAL) BY MOUTH DAILY WITH BREAKFAST. 100 tablet 1  . glucose blood test strip USE AS INSTRUCTED 100 strip 11  . levothyroxine (SYNTHROID) 50 MCG tablet TAKE 1 & 1/2 TABLETS (75 MCG TOTAL) BY MOUTH DAILY. 45 tablet 6  . metFORMIN (GLUCOPHAGE) 1000 MG tablet TAKE 1 TABLET (1,000 MG TOTAL) BY MOUTH 2 (TWO) TIMES DAILY WITH A MEAL. 60 tablet 6  . TRUEplus Lancets 28G MISC USE AS DIRECTED TWICE DAILY 100 each 11   No current facility-administered medications on file prior to visit.    Allergies  Allergen Reactions  . Onglyza [Saxagliptin] Nausea Only  . Sulfonylureas Other (See Comments)    Severe stomach ache with Amaryl and glipizide XL     Social History   Socioeconomic History  . Marital status: Single    Spouse name: Not on file  . Number of children: Not on file  . Years of education: Not on file  . Highest  education level: Not on file  Occupational History  . Not on file  Tobacco Use  . Smoking status: Never Smoker  . Smokeless tobacco: Never Used  Vaping Use  . Vaping Use: Never used  Substance and Sexual Activity  . Alcohol use: No  . Drug use: No  . Sexual activity: Yes    Birth control/protection: None  Other Topics Concern  . Not on file  Social History Narrative  . Not on file   Social Determinants of Health   Financial Resource Strain: Not on file  Food Insecurity: Not on file  Transportation Needs: Not on file  Physical Activity: Not on file  Stress: Not on file  Social Connections: Not on file  Intimate Partner Violence: Not on file    Family History  Problem Relation Age of Onset  . Diabetes Father   . Heart disease Father   . Diabetes Sister   . Hyperlipidemia Brother     Past Surgical History:  Procedure Laterality Date  . ABDOMINAL  HYSTERECTOMY N/A 01/31/2013   Procedure: HYSTERECTOMY ABDOMINAL;  Surgeon: Lavonia Drafts, MD;  Location: [facility];  Service: Gynecology;  Laterality: N/A;  . BILATERAL SALPINGECTOMY Bilateral 01/31/2013   Procedure: BILATERAL SALPINGECTOMY;  Surgeon: Lavonia Drafts, MD;  Location: [facility];  Service: Gynecology;  Laterality: Bilateral;  . CYSTOSCOPY WITH STENT PLACEMENT Bilateral 01/31/2013   Procedure: CYSTOSCOPY WITH STENT PLACEMENT;  Surgeon: Malka So, MD;  Location: [facility];  Service: Urology;  Laterality: Bilateral;  with c-arm    ROS: Review of Systems Negative except as stated above  PHYSICAL EXAM: BP 120/64   Pulse 67   Resp 16   Wt 137 lb 12.8 oz (62.5 kg)   LMP 01/19/2013   SpO2 96%   BMI 25.20 kg/m   Wt Readings from Last 3 Encounters:  05/07/21 137 lb 12.8 oz (62.5 kg)  01/01/21 141 lb 12.8 oz (64.3 kg)  04/24/20 137 lb (62.1 kg)    Physical Exam  General appearance - alert, well appearing, and in no distress Mental status - normal mood, behavior, speech, dress, motor activity, and thought processes Neck - supple, no significant adenopathy Chest - clear to auscultation, no wheezes, rales or rhonchi, symmetric air entry Heart - normal rate, regular rhythm, normal S1, S2, no murmurs, rubs, clicks or gallops Extremities - peripheral pulses normal, no pedal edema, no clubbing or cyanosis   CMP Latest Ref Rng & Units 07/24/2020 02/14/2019 02/05/2018  Glucose 65 - 99 mg/dL 92 81 90  BUN 6 - 24 mg/dL _0 Creatinine 0.57 - 1.00 mg/dL 0.80 0.79 0.75  Sodium 134 - 144 mmol/L 139 140 141  Potassium 3.5 - 5.2 mmol/L 4.2 4.6 4.7  Chloride 96 - 106 mmol/L 102 102 101  CO2 20 - 29 mmol/L _1 Calcium 8.7 - 10.2 mg/dL 9.7 9.9 9.6  Total Protein 6.0 - 8.5 g/dL 7.2 7.1 7.5  Total Bilirubin 0.0 - 1.2 mg/dL 0.5 0.5 0.4  Alkaline Phos 48 - 121 IU/L 101 85 99  AST 0 - 40 IU/L _2 ALT 0 - 32 IU/L _3 Lipid Panel     Component Value Date/Time    CHOL 151 07/24/2020 1526   TRIG 91 07/24/2020 1526   HDL 55 07/24/2020 1526   CHOLHDL 2.7 07/24/2020 1526   CHOLHDL 2.7 02/07/2017 1626   VLDL 17 02/07/2017 1626   LDLCALC 79 07/24/2020 1526  CBC    Component Value Date/Time   WBC 9.7 01/01/2021 1701   WBC 7.1 01/22/2015 1047   RBC 4.60 01/01/2021 1701   RBC 4.73 01/22/2015 1047   HGB 12.8 01/01/2021 1701   HCT 39.0 01/01/2021 1701   PLT 315 01/01/2021 1701   MCV 85 01/01/2021 1701   MCH 27.8 01/01/2021 1701   MCH 26.8 01/22/2015 1047   MCHC 32.8 01/01/2021 1701   MCHC 32.0 01/22/2015 1047   RDW 13.6 01/01/2021 1701   LYMPHSABS 2.4 01/06/2009 2230   MONOABS 0.6 01/06/2009 2230   EOSABS 0.1 01/06/2009 2230   BASOSABS 0.0 01/06/2009 2230    ASSESSMENT AND PLAN: 1. Type 2 diabetes mellitus without complication, without long-term current use of insulin (HCC) Blood sugars are good but A1c not at goal.  Again I recommend that we add an agent like [place] but patient declines wanting to continue just with the metformin.  She will continue healthy eating habits and regular exercise. - POCT glucose (manual entry) - POCT glycosylated hemoglobin (Hb A1C) - CBC - Comprehensive metabolic panel - Lipid panel  2. Hyperlipidemia associated with type 2 diabetes mellitus (HCC) Continue atorvastatin.  3. Acquired hypothyroidism Continue levothyroxine.  4. Iron deficiency We will recheck CBC today and will let her know whether she needs to restart the iron supplement.  5. Screening for colon cancer Given fit test kit today  6. Need for hepatitis C screening test - Hepatitis C Antibody  7. Need for shingles vaccine Given Shingrix today.    Patient was given the opportunity to ask questions.  Patient verbalized understanding of the plan and was able to repeat key elements of the plan.  [employer] Language interpreter used during this encounter. #[id number], [place]  Orders Placed This Encounter  Procedures  . CBC  . Comprehensive  metabolic panel  . Lipid panel  . Hepatitis C Antibody  . POCT glucose (manual entry)  . POCT glycosylated hemoglobin (Hb A1C)     Requested Prescriptions    No prescriptions requested or ordered in this encounter    No follow-ups on file.  Karle Plumber, MD, FACP

## 2021-05-08 ENCOUNTER — Other Ambulatory Visit: Payer: Self-pay | Admitting: Internal Medicine

## 2021-05-08 DIAGNOSIS — E875 Hyperkalemia: Secondary | ICD-10-CM

## 2021-05-08 LAB — COMPREHENSIVE METABOLIC PANEL
ALT: 12 IU/L (ref 0–32)
AST: 21 IU/L (ref 0–40)
Albumin/Globulin Ratio: 1.8 (ref 1.2–2.2)
Albumin: 4.4 g/dL (ref 3.8–4.9)
Alkaline Phosphatase: 98 IU/L (ref 44–121)
BUN/Creatinine Ratio: 21 (ref 9–23)
BUN: 15 mg/dL (ref 6–24)
Bilirubin Total: 0.6 mg/dL (ref 0.0–1.2)
CO2: 24 mmol/L (ref 20–29)
Calcium: 10 mg/dL (ref 8.7–10.2)
Chloride: 104 mmol/L (ref 96–106)
Creatinine, Ser: 0.73 mg/dL (ref 0.57–1.00)
Globulin, Total: 2.5 g/dL (ref 1.5–4.5)
Glucose: 93 mg/dL (ref 65–99)
Potassium: 5.4 mmol/L — ABNORMAL HIGH (ref 3.5–5.2)
Sodium: 142 mmol/L (ref 134–144)
Total Protein: 6.9 g/dL (ref 6.0–8.5)
eGFR: 98 mL/min/{1.73_m2} (ref >59)

## 2021-05-08 LAB — CBC
Hematocrit: 38.2 % (ref 34.0–46.6)
Hemoglobin: 11.8 g/dL (ref 11.1–15.9)
MCH: 26.5 pg — ABNORMAL LOW (ref 26.6–33.0)
MCHC: 30.9 g/dL — ABNORMAL LOW (ref 31.5–35.7)
MCV: 86 fL (ref 79–97)
Platelets: 323 10*3/uL (ref 150–450)
RBC: 4.45 x10E6/uL (ref 3.77–5.28)
RDW: 12.9 % (ref 11.7–15.4)
WBC: 7 10*3/uL (ref 3.4–10.8)

## 2021-05-08 LAB — LIPID PANEL
Chol/HDL Ratio: 2.8 ratio (ref 0.0–4.4)
Cholesterol, Total: 146 mg/dL (ref 100–199)
HDL: 53 mg/dL (ref >39)
LDL Chol Calc (NIH): 77 mg/dL (ref 0–99)
Triglycerides: 87 mg/dL (ref 0–149)
VLDL Cholesterol Cal: 16 mg/dL (ref 5–40)

## 2021-05-08 LAB — HEPATITIS C ANTIBODY: Hep C Virus Ab: 0.1 s/co ratio (ref 0.0–0.9)

## 2021-05-08 NOTE — Progress Notes (Signed)
Let patient know her blood count has decreased a little compared to when it was last checked in January.  I recommend that she take the iron supplement 3 times a week.  Potassium level is slightly elevated.  Try to cut back on some potassium rich foods like bananas, oranges and orange juice.  Return to the lab in 1 week for repeat potassium level check.  Kidney and liver function tests are good.  Cholesterol level good.  Screen for hepatitis C is negative.

## 2021-05-25 ENCOUNTER — Other Ambulatory Visit: Payer: Self-pay

## 2021-05-25 ENCOUNTER — Other Ambulatory Visit: Payer: Self-pay | Admitting: Internal Medicine

## 2021-05-25 DIAGNOSIS — E785 Hyperlipidemia, unspecified: Secondary | ICD-10-CM

## 2021-05-25 MED ORDER — ATORVASTATIN CALCIUM 40 MG PO TABS
ORAL_TABLET | Freq: Every day | ORAL | 1 refills | Status: DC
  Filled 2021-05-25: qty 30, 30d supply, fill #0
  Filled 2021-06-22: qty 30, 30d supply, fill #1

## 2021-05-25 MED FILL — Metformin HCl Tab 1000 MG: ORAL | 30 days supply | Qty: 60 | Fill #2 | Status: AC

## 2021-05-26 ENCOUNTER — Other Ambulatory Visit: Payer: Self-pay

## 2021-06-22 ENCOUNTER — Other Ambulatory Visit: Payer: Self-pay

## 2021-06-22 MED FILL — Metformin HCl Tab 1000 MG: ORAL | 30 days supply | Qty: 60 | Fill #3 | Status: AC

## 2021-06-23 ENCOUNTER — Other Ambulatory Visit: Payer: Self-pay

## 2021-07-26 ENCOUNTER — Other Ambulatory Visit: Payer: Self-pay | Admitting: Internal Medicine

## 2021-07-26 ENCOUNTER — Other Ambulatory Visit: Payer: Self-pay

## 2021-07-26 DIAGNOSIS — E785 Hyperlipidemia, unspecified: Secondary | ICD-10-CM

## 2021-07-26 MED ORDER — ATORVASTATIN CALCIUM 40 MG PO TABS
ORAL_TABLET | Freq: Every day | ORAL | 1 refills | Status: DC
  Filled 2021-07-26: qty 30, 30d supply, fill #0
  Filled 2021-08-24: qty 30, 30d supply, fill #1

## 2021-07-26 MED FILL — Metformin HCl Tab 1000 MG: ORAL | 30 days supply | Qty: 60 | Fill #4 | Status: AC

## 2021-07-26 MED FILL — Glucose Blood Test Strip: 25 days supply | Qty: 100 | Fill #1 | Status: AC

## 2021-07-27 ENCOUNTER — Other Ambulatory Visit: Payer: Self-pay

## 2021-08-11 ENCOUNTER — Other Ambulatory Visit: Payer: Self-pay

## 2021-08-11 ENCOUNTER — Ambulatory Visit: Payer: No Typology Code available for payment source | Attending: Internal Medicine

## 2021-08-13 ENCOUNTER — Ambulatory Visit: Payer: Self-pay | Attending: Internal Medicine | Admitting: Internal Medicine

## 2021-08-13 ENCOUNTER — Other Ambulatory Visit: Payer: Self-pay

## 2021-08-13 ENCOUNTER — Encounter: Payer: Self-pay | Admitting: Internal Medicine

## 2021-08-13 VITALS — BP 126/78 | HR 76 | Resp 16 | Wt 139.2 lb

## 2021-08-13 DIAGNOSIS — Z1211 Encounter for screening for malignant neoplasm of colon: Secondary | ICD-10-CM

## 2021-08-13 DIAGNOSIS — E119 Type 2 diabetes mellitus without complications: Secondary | ICD-10-CM

## 2021-08-13 DIAGNOSIS — E039 Hypothyroidism, unspecified: Secondary | ICD-10-CM

## 2021-08-13 DIAGNOSIS — Z23 Encounter for immunization: Secondary | ICD-10-CM

## 2021-08-13 DIAGNOSIS — E1169 Type 2 diabetes mellitus with other specified complication: Secondary | ICD-10-CM

## 2021-08-13 DIAGNOSIS — K0889 Other specified disorders of teeth and supporting structures: Secondary | ICD-10-CM

## 2021-08-13 DIAGNOSIS — E785 Hyperlipidemia, unspecified: Secondary | ICD-10-CM

## 2021-08-13 LAB — POCT GLYCOSYLATED HEMOGLOBIN (HGB A1C): HbA1c, POC (controlled diabetic range): 7.7 % — AB (ref 0.0–7.0)

## 2021-08-13 LAB — GLUCOSE, POCT (MANUAL RESULT ENTRY): POC Glucose: 124 mg/dl — AB (ref 70–99)

## 2021-08-13 MED ORDER — DAPAGLIFLOZIN PROPANEDIOL 5 MG PO TABS
5.0000 mg | ORAL_TABLET | Freq: Every day | ORAL | 4 refills | Status: DC
  Filled 2021-08-13: qty 30, 30d supply, fill #0

## 2021-08-13 NOTE — Progress Notes (Signed)
Patient ID: Cynthia Wiley, female    DOB: 11/27/1966  MRN: [id number]  CC: Chronic disease management  Subjective: Cynthia Wiley is a 55 y.o. female who presents for chronic ds managent Her concerns today include:  pt with hx of  DM type 2, HL, iron def and hypothyroid   C/O dental pain due to decayed tooth in RT lower jaw.  Request dental referral.  DIABETES TYPE 2 Last A1C:   Results for orders placed or performed in visit on 08/13/21  POCT glucose (manual entry)  Result Value Ref Range   POC Glucose 124 (A) 70 - 99 mg/dl  POCT glycosylated hemoglobin (Hb A1C)  Result Value Ref Range   Hemoglobin A1C     HbA1c POC (<> result, manual entry)     HbA1c, POC (prediabetic range)     HbA1c, POC (controlled diabetic range) 7.7 (A) 0.0 - 7.0 %  A1c unchanged from last visit. Med Adherence:  '[x]'  Yes -Metformin 1gram BID   '[]'  No Medication side effects:  '[]'  Yes    '[x]'  No Home Monitoring?  '[x]'  Yes BID in morning and at night   '[]'  No Home glucose results range: 93-100 Diet Adherence: '[x]'  Yes    '[]'  No Exercise: '[x]'  Yes  daily for 45 mins  '[]'  No Hypoglycemic episodes?: '[]'  Yes    '[x]'  No Numbness of the feet? '[]'  Yes    '[]'  No Retinopathy hx? '[]'  Yes    '[]'  No Last eye exam: 07/07/2021 Dr. Marin Comment.  Pt has copy of it for our records Comments:   HL: taking and tolerating Lipitor  Anemia:  taking iron supplement 2 x a wk  Thyroid: compliant with levothyroxine.  Denies feeling hot or cold all the time.  No unexplained weight changes. Patient Active Problem List   Diagnosis Date Noted   Anemia 02/14/2019   Hyperlipidemia 08/07/2017   DM2 (diabetes mellitus, type 2) ([place]) 01/22/2015   Hypothyroidism 01/22/2015     Current Outpatient Medications on File Prior to Visit  Medication Sig Dispense Refill   aspirin EC 81 MG tablet Take 1 tablet (81 mg total) by mouth daily. 100 tablet 1   atorvastatin (LIPITOR) 40 MG tablet TAKE 1 TABLET (40 MG TOTAL) BY MOUTH DAILY. 30 tablet 1    Blood Glucose Monitoring Suppl (TRUE METRIX METER) w/Device KIT 1 each by Does not apply route as needed. 1 kit 0   ferrous sulfate 325 (65 FE) MG tablet TAKE 1 TABLET (325 MG TOTAL) BY MOUTH DAILY WITH BREAKFAST. 100 tablet 1   glucose blood test strip USE AS INSTRUCTED 100 strip 11   levothyroxine (SYNTHROID) 50 MCG tablet TAKE 1 & 1/2 TABLETS (75 MCG TOTAL) BY MOUTH DAILY. 45 tablet 6   metFORMIN (GLUCOPHAGE) 1000 MG tablet TAKE 1 TABLET (1,000 MG TOTAL) BY MOUTH 2 (TWO) TIMES DAILY WITH A MEAL. 60 tablet 6   No current facility-administered medications on file prior to visit.    Allergies  Allergen Reactions   Onglyza [Saxagliptin] Nausea Only   Sulfonylureas Other (See Comments)    Severe stomach ache with Amaryl and glipizide XL     Social History   Socioeconomic History   Marital status: Single    Spouse name: Not on file   Number of children: Not on file   Years of education: Not on file   Highest education level: Not on file  Occupational History   Not on file  Tobacco Use  Smoking status: Never   Smokeless tobacco: Never  Vaping Use   Vaping Use: Never used  Substance and Sexual Activity   Alcohol use: No   Drug use: No   Sexual activity: Yes    Birth control/protection: None  Other Topics Concern   Not on file  Social History Narrative   Not on file   Social Determinants of Health   Financial Resource Strain: Not on file  Food Insecurity: Not on file  Transportation Needs: Not on file  Physical Activity: Not on file  Stress: Not on file  Social Connections: Not on file  Intimate Partner Violence: Not on file    Family History  Problem Relation Age of Onset   Diabetes Father    Heart disease Father    Diabetes Sister    Hyperlipidemia Brother     Past Surgical History:  Procedure Laterality Date   ABDOMINAL HYSTERECTOMY N/A 01/31/2013   Procedure: HYSTERECTOMY ABDOMINAL;  Surgeon: Lavonia Drafts, MD;  Location: [facility];  Service:  Gynecology;  Laterality: N/A;   BILATERAL SALPINGECTOMY Bilateral 01/31/2013   Procedure: BILATERAL SALPINGECTOMY;  Surgeon: Lavonia Drafts, MD;  Location: [facility];  Service: Gynecology;  Laterality: Bilateral;   CYSTOSCOPY WITH STENT PLACEMENT Bilateral 01/31/2013   Procedure: CYSTOSCOPY WITH STENT PLACEMENT;  Surgeon: Malka So, MD;  Location: [facility];  Service: Urology;  Laterality: Bilateral;  with c-arm    ROS: Review of Systems Negative except as stated above  PHYSICAL EXAM: BP 126/78   Pulse 76   Resp 16   Wt 139 lb 3.2 oz (63.1 kg)   LMP 01/19/2013   SpO2 98%   BMI 25.46 kg/m   Physical Exam  General appearance - alert, well appearing, and in no distress Mental status - normal mood, behavior, speech, dress, motor activity, and thought processes Neck - supple, no significant adenopathy Mouth: She has a decayed wisdom tooth in the right lower jaw.  No signs of abscess. Chest - clear to auscultation, no wheezes, rales or rhonchi, symmetric air entry Heart - normal rate, regular rhythm, normal S1, S2, no murmurs, rubs, clicks or gallops Extremities - peripheral pulses normal, no pedal edema, no clubbing or cyanosis   CMP Latest Ref Rng & Units 05/07/2021 07/24/2020 02/14/2019  Glucose 65 - 99 mg/dL 93 92 81  BUN 6 - 24 mg/dL '15 13 11  ' Creatinine 0.57 - 1.00 mg/dL 0.73 0.80 0.79  Sodium 134 - 144 mmol/L 142 139 140  Potassium 3.5 - 5.2 mmol/L 5.4(H) 4.2 4.6  Chloride 96 - 106 mmol/L 104 102 102  CO2 20 - 29 mmol/L '24 22 22  ' Calcium 8.7 - 10.2 mg/dL 10.0 9.7 9.9  Total Protein 6.0 - 8.5 g/dL 6.9 7.2 7.1  Total Bilirubin 0.0 - 1.2 mg/dL 0.6 0.5 0.5  Alkaline Phos 44 - 121 IU/L 98 101 85  AST 0 - 40 IU/L '21 19 23  ' ALT 0 - 32 IU/L '12 10 17   ' Lipid Panel     Component Value Date/Time   CHOL 146 05/07/2021 1451   TRIG 87 05/07/2021 1451   HDL 53 05/07/2021 1451   CHOLHDL 2.8 05/07/2021 1451   CHOLHDL 2.7 02/07/2017 1626   VLDL 17 02/07/2017 1626   LDLCALC 77  05/07/2021 1451    CBC    Component Value Date/Time   WBC 7.0 05/07/2021 1451   WBC 7.1 01/22/2015 1047   RBC 4.45 05/07/2021 1451   RBC 4.73 01/22/2015 1047   HGB  11.8 05/07/2021 1451   HCT 38.2 05/07/2021 1451   PLT 323 05/07/2021 1451   MCV 86 05/07/2021 1451   MCH 26.5 (L) 05/07/2021 1451   MCH 26.8 01/22/2015 1047   MCHC 30.9 (L) 05/07/2021 1451   MCHC 32.0 01/22/2015 1047   RDW 12.9 05/07/2021 1451   LYMPHSABS 2.4 01/06/2009 2230   MONOABS 0.6 01/06/2009 2230   EOSABS 0.1 01/06/2009 2230   BASOSABS 0.0 01/06/2009 2230   Lab Results  Component Value Date   TSH 1.190 01/01/2021     ASSESSMENT AND PLAN: 1. Type 2 diabetes mellitus without complication, without long-term current use of insulin ([place]) Reported blood sugars are good but A1c not at goal.  We discussed adding a low-dose of Farxiga to try to get her at goal.  Patient is agreeable to this.  Advised to drink adequate fluids every day to keep herself hydrated while on this medication.  She will continue metformin.  Continue healthy eating habits and regular exercise. - POCT glucose (manual entry) - POCT glycosylated hemoglobin (Hb A1C) - dapagliflozin propanediol (FARXIGA) 5 MG TABS tablet; Take 1 tablet (5 mg total) by mouth daily before breakfast.  Dispense: 30 tablet; Refill: 4 - Basic Metabolic Panel  2. Pain, dental - Ambulatory referral to Dentistry  3. Hyperlipidemia associated with type 2 diabetes mellitus (HCC) Continue atorvastatin.  4. Acquired hypothyroidism Continue levothyroxine.  Last thyroid level was at goal.  5. Screening for colon cancer - Fecal occult blood, imunochemical([facility])  6. Need for influenza vaccination Given today.    Patient was given the opportunity to ask questions.  Patient verbalized understanding of the plan and was able to repeat key elements of the plan.  [employer] Language interpreter used during this encounter. #[id number] [place]  Orders Placed This  Encounter  Procedures   Fecal occult blood, imunochemical([facility])   Flu Vaccine QUAD 51moIM (Fluarix, Fluzone & Alfiuria Quad PF)   Basic Metabolic Panel   Ambulatory referral to Dentistry   POCT glucose (manual entry)   POCT glycosylated hemoglobin (Hb A1C)     Requested Prescriptions   Signed Prescriptions Disp Refills   dapagliflozin propanediol (FARXIGA) 5 MG TABS tablet 30 tablet 4    Sig: Take 1 tablet (5 mg total) by mouth daily before breakfast.    Return in about 4 months (around 12/13/2021).  DKarle Plumber MD, FACP

## 2021-08-14 LAB — BASIC METABOLIC PANEL
BUN/Creatinine Ratio: 19 (ref 9–23)
BUN: 12 mg/dL (ref 6–24)
CO2: 23 mmol/L (ref 20–29)
Calcium: 10.1 mg/dL (ref 8.7–10.2)
Chloride: 102 mmol/L (ref 96–106)
Creatinine, Ser: 0.64 mg/dL (ref 0.57–1.00)
Glucose: 115 mg/dL — ABNORMAL HIGH (ref 65–99)
Potassium: 4.9 mmol/L (ref 3.5–5.2)
Sodium: 142 mmol/L (ref 134–144)
eGFR: 105 mL/min/{1.73_m2} (ref >59)

## 2021-08-18 ENCOUNTER — Telehealth: Payer: Self-pay

## 2021-08-18 NOTE — Telephone Encounter (Signed)
[employer] Laural Benes  Id# [id number]  contacted pt to go over lab results pt didn't answer lvm   Sent a CRM and forward labs to NT to give pt labs when they call back

## 2021-08-19 LAB — FECAL OCCULT BLOOD, IMMUNOCHEMICAL: Fecal Occult Bld: NEGATIVE

## 2021-08-20 ENCOUNTER — Telehealth: Payer: Self-pay

## 2021-08-20 NOTE — Telephone Encounter (Signed)
Contacted pt to go over lab results pt is aware and doesn't have any questions or concerns  [employer] interperter: josbel ID: [id number]

## 2021-08-23 ENCOUNTER — Telehealth: Payer: Self-pay | Admitting: Internal Medicine

## 2021-08-23 NOTE — Telephone Encounter (Signed)
Noted. Pt was informed that she will need to wait for a call

## 2021-08-23 NOTE — Telephone Encounter (Signed)
Copied from [place] [id number]. Topic: General - Other >> Aug 23, 2021 10:29 AM Celene Kras wrote: Reason for CRM: Pt called stating that she is needing to have PCP give her a call back. She states that she went to the dentist office that she was referred to by PCP and that they are requesting to speak with PCP regarding the referral. Please advise.    Phone # [phone] Cynthia Wiley  Patient came by and was provided the information about the referral and was told she would be contacted by the facility when they were ready to schedule.

## 2021-08-24 ENCOUNTER — Other Ambulatory Visit: Payer: Self-pay

## 2021-08-24 MED FILL — Metformin HCl Tab 1000 MG: ORAL | 30 days supply | Qty: 60 | Fill #5 | Status: AC

## 2021-09-21 ENCOUNTER — Other Ambulatory Visit: Payer: Self-pay

## 2021-09-21 ENCOUNTER — Other Ambulatory Visit: Payer: Self-pay | Admitting: Internal Medicine

## 2021-09-21 DIAGNOSIS — E785 Hyperlipidemia, unspecified: Secondary | ICD-10-CM

## 2021-09-21 MED ORDER — ATORVASTATIN CALCIUM 40 MG PO TABS
ORAL_TABLET | Freq: Every day | ORAL | 1 refills | Status: DC
  Filled 2021-09-21: qty 30, 30d supply, fill #0
  Filled 2021-11-04: qty 30, 30d supply, fill #1

## 2021-09-21 MED FILL — Glucose Blood Test Strip: 25 days supply | Qty: 100 | Fill #2 | Status: AC

## 2021-09-21 MED FILL — Metformin HCl Tab 1000 MG: ORAL | 30 days supply | Qty: 60 | Fill #6 | Status: AC

## 2021-09-22 ENCOUNTER — Other Ambulatory Visit: Payer: Self-pay

## 2021-11-04 ENCOUNTER — Other Ambulatory Visit: Payer: Self-pay | Admitting: Internal Medicine

## 2021-11-04 ENCOUNTER — Other Ambulatory Visit: Payer: Self-pay

## 2021-11-04 DIAGNOSIS — E039 Hypothyroidism, unspecified: Secondary | ICD-10-CM

## 2021-11-04 DIAGNOSIS — E119 Type 2 diabetes mellitus without complications: Secondary | ICD-10-CM

## 2021-11-04 MED FILL — Glucose Blood Test Strip: 25 days supply | Qty: 100 | Fill #3 | Status: AC

## 2021-11-05 ENCOUNTER — Other Ambulatory Visit: Payer: Self-pay

## 2021-11-05 MED ORDER — TRUE METRIX METER W/DEVICE KIT
1.0000 | PACK | 0 refills | Status: AC | PRN
  Filled 2021-11-05: qty 1, 30d supply, fill #0

## 2021-11-05 MED ORDER — METFORMIN HCL 1000 MG PO TABS
ORAL_TABLET | Freq: Two times a day (BID) | ORAL | 1 refills | Status: DC
  Filled 2021-11-05: qty 60, 30d supply, fill #0
  Filled 2021-12-22: qty 60, 30d supply, fill #1
  Filled 2021-12-22: qty 60, 30d supply, fill #0

## 2021-11-05 MED ORDER — LEVOTHYROXINE SODIUM 50 MCG PO TABS
ORAL_TABLET | ORAL | 1 refills | Status: DC
  Filled 2021-11-05: qty 45, 30d supply, fill #0
  Filled 2021-12-22: qty 45, 30d supply, fill #1
  Filled 2021-12-22: qty 45, 30d supply, fill #0

## 2021-11-08 ENCOUNTER — Other Ambulatory Visit: Payer: Self-pay | Admitting: Pharmacist

## 2021-11-08 ENCOUNTER — Other Ambulatory Visit: Payer: Self-pay

## 2021-11-08 MED ORDER — TRUEPLUS LANCETS 28G MISC
0 refills | Status: DC
  Filled 2021-11-08: qty 100, 33d supply, fill #0

## 2021-11-09 ENCOUNTER — Other Ambulatory Visit: Payer: Self-pay

## 2021-12-17 ENCOUNTER — Ambulatory Visit: Payer: Self-pay | Attending: Internal Medicine | Admitting: Internal Medicine

## 2021-12-17 ENCOUNTER — Other Ambulatory Visit: Payer: Self-pay

## 2021-12-17 VITALS — BP 139/77 | HR 80 | Ht 62.0 in | Wt 140.0 lb

## 2021-12-17 DIAGNOSIS — E785 Hyperlipidemia, unspecified: Secondary | ICD-10-CM

## 2021-12-17 DIAGNOSIS — E611 Iron deficiency: Secondary | ICD-10-CM

## 2021-12-17 DIAGNOSIS — E119 Type 2 diabetes mellitus without complications: Secondary | ICD-10-CM

## 2021-12-17 DIAGNOSIS — E039 Hypothyroidism, unspecified: Secondary | ICD-10-CM

## 2021-12-17 DIAGNOSIS — E1169 Type 2 diabetes mellitus with other specified complication: Secondary | ICD-10-CM

## 2021-12-17 DIAGNOSIS — Z23 Encounter for immunization: Secondary | ICD-10-CM

## 2021-12-17 DIAGNOSIS — R030 Elevated blood-pressure reading, without diagnosis of hypertension: Secondary | ICD-10-CM

## 2021-12-17 LAB — POCT GLYCOSYLATED HEMOGLOBIN (HGB A1C): HbA1c, POC (controlled diabetic range): 8.7 % — AB (ref 0.0–7.0)

## 2021-12-17 NOTE — Progress Notes (Signed)
Patient ID: Cynthia Wiley, female    DOB: 14-Feb-1966  MRN: [id number]  CC: Diabetes   Subjective: Cynthia Wiley is a 56 y.o. female who presents for chronic ds management Her concerns today include:  pt with hx of  DM type 2, HL, iron def and hypothyroid  DIABETES TYPE 2 Last A1C:   Results for orders placed or performed in visit on 12/17/21  POCT glycosylated hemoglobin (Hb A1C)  Result Value Ref Range   Hemoglobin A1C     HbA1c POC (<> result, manual entry)     HbA1c, POC (prediabetic range)     HbA1c, POC (controlled diabetic range) 8.7 (A) 0.0 - 7.0 %    8.7 Med Adherence:  _0  Yes Metformin 1 gram BID.  Started on Farxiga on last visit.  She took it once and it caused nausea so she did not take it again.  Did not take tolerate Onglyza (caused nausea) or sulfonylureas (caused severe stomach ache)   Medication side effects:  _1  Yes    _2  No Home Monitoring?  _3  Yes    _4  No Home glucose results range: before BF range 105-115  and bedtime range 120-162 Diet Adherence: _5  Yes -eating more veggies and less bread  _6  No Exercise: _7  Yes 30 mins daily   _8  No Hypoglycemic episodes?: _9  Yes    _10  No Numbness of the feet? _11  Yes    _12  No Retinopathy hx? _13  Yes    _14  No Last eye exam:  Comments:   Hypothyroid:  reports compliance with Levothyroxine 75 mcg daily.  No hair loss, palpitations or wgh changes  HL:  tolerating Lipitor.   Anemia:  still taking iron 2x/wk  HM:  due for PCV15  Patient Active Problem List   Diagnosis Date Noted   Anemia 02/14/2019   Hyperlipidemia 08/07/2017   DM2 (diabetes mellitus, type 2) ([place]) 01/22/2015   Hypothyroidism 01/22/2015     Current Outpatient Medications on File Prior to Visit  Medication Sig Dispense Refill   aspirin EC 81 MG tablet Take 1 tablet (81 mg total) by mouth daily. 100 tablet 1   atorvastatin (LIPITOR) 40 MG tablet TAKE 1 TABLET (40 MG TOTAL) BY MOUTH DAILY. 30 tablet 1   Blood Glucose  Monitoring Suppl (TRUE METRIX METER) w/Device KIT Use as directed as needed. 1 kit 0   dapagliflozin propanediol (FARXIGA) 5 MG TABS tablet Take 1 tablet (5 mg total) by mouth daily before breakfast. 30 tablet 4   ferrous sulfate 325 (65 FE) MG tablet TAKE 1 TABLET (325 MG TOTAL) BY MOUTH DAILY WITH BREAKFAST. 100 tablet 1   glucose blood test strip USE AS INSTRUCTED 100 strip 11   levothyroxine (SYNTHROID) 50 MCG tablet TAKE 1 & 1/2 TABLETS (75 MCG TOTAL) BY MOUTH DAILY. 45 tablet 1   metFORMIN (GLUCOPHAGE) 1000 MG tablet TAKE 1 TABLET (1,000 MG TOTAL) BY MOUTH 2 (TWO) TIMES DAILY WITH A MEAL. 60 tablet 1   TRUEplus Lancets 28G MISC Use to check blood sugar once daily. 100 each 0   No current facility-administered medications on file prior to visit.    Allergies  Allergen Reactions   Onglyza [Saxagliptin] Nausea Only   Sulfonylureas Other (See Comments)    Severe stomach ache with Amaryl and glipizide XL     Social History   Socioeconomic History   Marital status: Single    Spouse name: Not on file   Number of children: Not on file  Years of education: Not on file   Highest education level: Not on file  Occupational History   Not on file  Tobacco Use   Smoking status: Never   Smokeless tobacco: Never  Vaping Use   Vaping Use: Never used  Substance and Sexual Activity   Alcohol use: No   Drug use: No   Sexual activity: Yes    Birth control/protection: None  Other Topics Concern   Not on file  Social History Narrative   Not on file   Social Determinants of Health   Financial Resource Strain: Not on file  Food Insecurity: Not on file  Transportation Needs: Not on file  Physical Activity: Not on file  Stress: Not on file  Social Connections: Not on file  Intimate Partner Violence: Not on file    Family History  Problem Relation Age of Onset   Diabetes Father    Heart disease Father    Diabetes Sister    Hyperlipidemia Brother     Past Surgical History:   Procedure Laterality Date   ABDOMINAL HYSTERECTOMY N/A 01/31/2013   Procedure: HYSTERECTOMY ABDOMINAL;  Surgeon: Lavonia Drafts, MD;  Location: [facility];  Service: Gynecology;  Laterality: N/A;   BILATERAL SALPINGECTOMY Bilateral 01/31/2013   Procedure: BILATERAL SALPINGECTOMY;  Surgeon: Lavonia Drafts, MD;  Location: [facility];  Service: Gynecology;  Laterality: Bilateral;   CYSTOSCOPY WITH STENT PLACEMENT Bilateral 01/31/2013   Procedure: CYSTOSCOPY WITH STENT PLACEMENT;  Surgeon: Malka So, MD;  Location: [facility];  Service: Urology;  Laterality: Bilateral;  with c-arm    ROS: Review of Systems Negative except as stated above  PHYSICAL EXAM: BP 139/77    Pulse 80    Ht _0  (1.575 m)    Wt 140 lb (63.5 kg)    LMP 01/19/2013    SpO2 96%    BMI 25.61 kg/m   Wt Readings from Last 3 Encounters:  12/17/21 140 lb (63.5 kg)  08/13/21 139 lb 3.2 oz (63.1 kg)  05/07/21 137 lb 12.8 oz (62.5 kg)   BP recheck 140/70 Physical Exam  General appearance - alert, well appearing, and in no distress Mental status - normal mood, behavior, speech, dress, motor activity, and thought processes Neck - supple, no significant adenopathy Chest - clear to auscultation, no wheezes, rales or rhonchi, symmetric air entry Heart - normal rate, regular rhythm, normal S1, S2, no murmurs, rubs, clicks or gallops Extremities - peripheral pulses normal, no pedal edema, no clubbing or cyanosis  Results for orders placed or performed in visit on 12/17/21  POCT glycosylated hemoglobin (Hb A1C)  Result Value Ref Range   Hemoglobin A1C     HbA1c POC (<> result, manual entry)     HbA1c, POC (prediabetic range)     HbA1c, POC (controlled diabetic range) 8.7 (A) 0.0 - 7.0 %     CMP Latest Ref Rng & Units 08/13/2021 05/07/2021 07/24/2020  Glucose 65 - 99 mg/dL 115(H) 93 92  BUN 6 - 24 mg/dL _1 Creatinine 0.57 - 1.00 mg/dL 0.64 0.73 0.80  Sodium 134 - 144 mmol/L 142 142 139  Potassium 3.5 - 5.2 mmol/L  4.9 5.4(H) 4.2  Chloride 96 - 106 mmol/L 102 104 102  CO2 20 - 29 mmol/L _2 Calcium 8.7 - 10.2 mg/dL 10.1 10.0 9.7  Total Protein 6.0 - 8.5 g/dL - 6.9 7.2  Total Bilirubin 0.0 - 1.2 mg/dL - 0.6 0.5  Alkaline Phos 44 - 121 IU/L -  98 101  AST 0 - 40 IU/L - 21 19  ALT 0 - 32 IU/L - 12 10   Lipid Panel     Component Value Date/Time   CHOL 146 05/07/2021 1451   TRIG 87 05/07/2021 1451   HDL 53 05/07/2021 1451   CHOLHDL 2.8 05/07/2021 1451   CHOLHDL 2.7 02/07/2017 1626   VLDL 17 02/07/2017 1626   LDLCALC 77 05/07/2021 1451    CBC    Component Value Date/Time   WBC 7.0 05/07/2021 1451   WBC 7.1 01/22/2015 1047   RBC 4.45 05/07/2021 1451   RBC 4.73 01/22/2015 1047   HGB 11.8 05/07/2021 1451   HCT 38.2 05/07/2021 1451   PLT 323 05/07/2021 1451   MCV 86 05/07/2021 1451   MCH 26.5 (L) 05/07/2021 1451   MCH 26.8 01/22/2015 1047   MCHC 30.9 (L) 05/07/2021 1451   MCHC 32.0 01/22/2015 1047   RDW 12.9 05/07/2021 1451   LYMPHSABS 2.4 01/06/2009 2230   MONOABS 0.6 01/06/2009 2230   EOSABS 0.1 01/06/2009 2230   BASOSABS 0.0 01/06/2009 2230    ASSESSMENT AND PLAN: 1. Type 2 diabetes mellitus without complication, without long-term current use of insulin ([place]) Patient reporting good blood sugar readings but A1c is not at goal. We discussed adding another medication versus having her try the [place] again since she only took it 1 time.  She prefers to try the [place] again since she already has it at home.  She will continue metformin.  Continue healthy eating habits and regular exercise. - POCT glycosylated hemoglobin (Hb A1C) - Microalbumin / creatinine urine ratio - Comprehensive metabolic panel  2. Elevated blood pressure reading DASH diet discussed and encouraged.  Follow-up with clinical pharmacist in 1 month for repeat blood pressure check.  3. Hyperlipidemia associated with type 2 diabetes mellitus (HCC) Continue atorvastatin. - Comprehensive metabolic panel  4.  Acquired hypothyroidism Continue levothyroxine 75 mcg daily. - TSH  5. Iron deficiency - CBC  6. Need for vaccination against Streptococcus pneumoniae Given PCV 15 today.   [employer] Language interpreter used during this encounter. #[id number], [place]  Patient was given the opportunity to ask questions.  Patient verbalized understanding of the plan and was able to repeat key elements of the plan.   Orders Placed This Encounter  Procedures   PNEUMOCOCCAL CONJUGATE VACCINE 15-VALENT   Microalbumin / creatinine urine ratio   TSH   Comprehensive metabolic panel   CBC   POCT glycosylated hemoglobin (Hb A1C)     Requested Prescriptions    No prescriptions requested or ordered in this encounter    Return in about 4 months (around 04/16/2022) for Appt with [facility] in 4 wks for BP check.  Karle Plumber, MD, FACP

## 2021-12-18 NOTE — Progress Notes (Signed)
Let pt know that her anemia is mildly worse compared to when last checked in 05/2021.  I recommend taking the iron supplement 4-5 times a week.  Thyroid level is okay.  Kidney and liver function tests are okay.

## 2021-12-19 LAB — CBC
Hematocrit: 34.2 % (ref 34.0–46.6)
Hemoglobin: 11 g/dL — ABNORMAL LOW (ref 11.1–15.9)
MCH: 26.2 pg — ABNORMAL LOW (ref 26.6–33.0)
MCHC: 32.2 g/dL (ref 31.5–35.7)
MCV: 81 fL (ref 79–97)
Platelets: 353 10*3/uL (ref 150–450)
RBC: 4.2 x10E6/uL (ref 3.77–5.28)
RDW: 13.9 % (ref 11.7–15.4)
WBC: 8.2 10*3/uL (ref 3.4–10.8)

## 2021-12-19 LAB — COMPREHENSIVE METABOLIC PANEL
ALT: 18 IU/L (ref 0–32)
AST: 23 IU/L (ref 0–40)
Albumin/Globulin Ratio: 1.5 (ref 1.2–2.2)
Albumin: 4.1 g/dL (ref 3.8–4.9)
Alkaline Phosphatase: 101 IU/L (ref 44–121)
BUN/Creatinine Ratio: 17 (ref 9–23)
BUN: 12 mg/dL (ref 6–24)
Bilirubin Total: 0.6 mg/dL (ref 0.0–1.2)
CO2: 23 mmol/L (ref 20–29)
Calcium: 9.5 mg/dL (ref 8.7–10.2)
Chloride: 103 mmol/L (ref 96–106)
Creatinine, Ser: 0.71 mg/dL (ref 0.57–1.00)
Globulin, Total: 2.8 g/dL (ref 1.5–4.5)
Glucose: 91 mg/dL (ref 70–99)
Potassium: 4 mmol/L (ref 3.5–5.2)
Sodium: 140 mmol/L (ref 134–144)
Total Protein: 6.9 g/dL (ref 6.0–8.5)
eGFR: 100 mL/min/{1.73_m2} (ref >59)

## 2021-12-19 LAB — TSH: TSH: 0.841 u[IU]/mL (ref 0.450–4.500)

## 2021-12-19 LAB — MICROALBUMIN / CREATININE URINE RATIO
Creatinine, Urine: 76.5 mg/dL
Microalb/Creat Ratio: 6 mg/g creat (ref 0–29)
Microalbumin, Urine: 4.6 ug/mL

## 2021-12-20 ENCOUNTER — Telehealth: Payer: Self-pay

## 2021-12-20 NOTE — Telephone Encounter (Signed)
Contacted pt to go over lab results pt didn't answer lvm Cynthia Wiley was the [employer] a CRM and forward labs to NT to give pt labs when they call back

## 2021-12-22 ENCOUNTER — Other Ambulatory Visit: Payer: Self-pay

## 2021-12-22 ENCOUNTER — Other Ambulatory Visit: Payer: Self-pay | Admitting: Pharmacist

## 2021-12-22 DIAGNOSIS — E785 Hyperlipidemia, unspecified: Secondary | ICD-10-CM

## 2021-12-22 MED ORDER — ATORVASTATIN CALCIUM 40 MG PO TABS
ORAL_TABLET | Freq: Every day | ORAL | 2 refills | Status: DC
  Filled 2021-12-22: qty 30, 30d supply, fill #0
  Filled 2022-01-21: qty 30, 30d supply, fill #1
  Filled 2022-02-21: qty 30, 30d supply, fill #2

## 2021-12-23 ENCOUNTER — Other Ambulatory Visit: Payer: Self-pay

## 2021-12-27 ENCOUNTER — Telehealth: Payer: Self-pay

## 2021-12-27 NOTE — Telephone Encounter (Signed)
[place], [place] #[id number] Pt given results per notes of Dr. Wynetta Emery on 12/27/21 from 12/18/21. Pt verbalized understanding.

## 2022-01-21 ENCOUNTER — Other Ambulatory Visit: Payer: Self-pay

## 2022-01-21 ENCOUNTER — Other Ambulatory Visit: Payer: Self-pay | Admitting: Internal Medicine

## 2022-01-21 DIAGNOSIS — E119 Type 2 diabetes mellitus without complications: Secondary | ICD-10-CM

## 2022-01-21 DIAGNOSIS — E039 Hypothyroidism, unspecified: Secondary | ICD-10-CM

## 2022-01-21 MED ORDER — LEVOTHYROXINE SODIUM 50 MCG PO TABS
ORAL_TABLET | ORAL | 2 refills | Status: DC
  Filled 2022-01-21: qty 45, 30d supply, fill #0
  Filled 2022-02-21: qty 45, 30d supply, fill #1
  Filled 2022-03-24: qty 45, 30d supply, fill #2

## 2022-01-21 MED ORDER — METFORMIN HCL 1000 MG PO TABS
ORAL_TABLET | Freq: Two times a day (BID) | ORAL | 2 refills | Status: DC
  Filled 2022-01-21: qty 60, 30d supply, fill #0
  Filled 2022-02-21: qty 60, 30d supply, fill #1
  Filled 2022-03-24: qty 60, 30d supply, fill #2

## 2022-01-24 ENCOUNTER — Ambulatory Visit: Payer: Self-pay | Attending: Internal Medicine | Admitting: Pharmacist

## 2022-01-24 ENCOUNTER — Encounter: Payer: Self-pay | Admitting: Pharmacist

## 2022-01-24 ENCOUNTER — Other Ambulatory Visit: Payer: Self-pay

## 2022-01-24 VITALS — BP 138/68 | HR 63

## 2022-01-24 DIAGNOSIS — R030 Elevated blood-pressure reading, without diagnosis of hypertension: Secondary | ICD-10-CM

## 2022-01-24 NOTE — Progress Notes (Signed)
S:     No chief complaint on file.  Cynthia Wiley is a 56 y.o. female who presents for hypertension evaluation, education, and management. PMH is significant for T2DM, hypothyroidism, elevated BP without the dx of HTN and HLD. Patient was referred and last seen by Primary Care Provider, Dr. Wynetta Emery, on 12/17/2021.   Today, she arrives in good spirits and presents without assistance. Denies dizziness, headache, blurred vision, swelling.   Patient reports never being dx with hypertension. She tells me today she has a tooth ache and has been taking Aleve daily for the last several days. She tells me she has a dentist's appt on 2/28 to address this. No other concerns at this time. BP was PCP was 139/77 on 12/17/2021.    Family/Social history:  -Fhx: DM, heart disease, HLD -Tobacco: never smoker  -Alcohol: denies use   Medication adherence: not currently taking antihypertensives.   Current antihypertensives include:  none  Antihypertensives tried in the past include: none, NKDA   Reported home BP readings: unable to check at home  Patient reported dietary habits:  - Denies any caffeine intake. Drinks soda only seldomly  - Compliant with salt restriction. Does not eat out and does not eat fast food frequently.   Patient-reported exercise habits:  - Active at work.  - Walks ~30 minutes daily outside of work  O:  Vitals:   01/24/22 1518  BP: 138/68  Pulse: 63    Last 3 Office BP readings: BP Readings from Last 3 Encounters:  01/24/22 138/68  12/17/21 139/77  08/13/21 126/78    BMET    Component Value Date/Time   NA 140 12/17/2021 1433   K 4.0 12/17/2021 1433   CL 103 12/17/2021 1433   CO2 23 12/17/2021 1433   GLUCOSE 91 12/17/2021 1433   GLUCOSE 113 (H) 02/07/2017 1626   BUN 12 12/17/2021 1433   CREATININE 0.71 12/17/2021 1433   CREATININE 0.61 02/07/2017 1626   CALCIUM 9.5 12/17/2021 1433   GFRNONAA 84 07/24/2020 1526   GFRNONAA >89 02/07/2017 1626    GFRAA 97 07/24/2020 1526   GFRAA >89 02/07/2017 1626    Renal function: CrCl cannot be calculated (Patient's most recent lab result is older than the maximum 21 days allowed.).  Clinical ASCVD: No  The 10-year ASCVD risk score (Arnett DK, et al., 2019) is: 3.3%   Values used to calculate the score:     Age: 59 years     Sex: Female     Is Non-Hispanic African American: No     Diabetic: Yes     Tobacco smoker: No     Systolic Blood Pressure: 614 mmHg     Is BP treated: No     HDL Cholesterol: 53 mg/dL     Total Cholesterol: 146 mg/dL   A/P: Hypertension undiagnosed. BP currently elevated not on medication. Normal BP is < 120/80 mmHg. Medication adherence: not currently taking. I believe her NSAID use and tooth pain could be driving her BP up. She sees a Pharmacist, community next week. Will hold off on BP medication for now. Advised her to follow-up in 1.5 months for reassessment.  - No  medications today.  -Counseled on lifestyle modifications for blood pressure control including reduced dietary sodium, increased exercise, adequate sleep. -Encouraged patient to check BP at home and bring log of readings to next visit. Counseled on proper use of home BP cuff.   Results reviewed and written information provided. Patient verbalized understanding of  treatment plan. Total time in face-to-face counseling 20 minutes.   F/u clinic visit in 1.5 months.  Benard Halsted, PharmD, [place], [place] [phone]

## 2022-02-21 ENCOUNTER — Other Ambulatory Visit: Payer: Self-pay

## 2022-02-21 ENCOUNTER — Other Ambulatory Visit: Payer: Self-pay | Admitting: Internal Medicine

## 2022-02-21 DIAGNOSIS — E119 Type 2 diabetes mellitus without complications: Secondary | ICD-10-CM

## 2022-02-23 MED ORDER — TRUEPLUS LANCETS 28G MISC
5 refills | Status: DC
  Filled 2022-02-23: qty 100, 100d supply, fill #0
  Filled 2022-02-23: qty 100, fill #0
  Filled 2022-07-04: qty 100, 25d supply, fill #1
  Filled 2022-09-06: qty 100, 25d supply, fill #2
  Filled 2023-01-25: qty 100, 25d supply, fill #3

## 2022-02-23 MED ORDER — TRUE METRIX BLOOD GLUCOSE TEST VI STRP
ORAL_STRIP | 11 refills | Status: DC
  Filled 2022-02-23: qty 100, 25d supply, fill #0
  Filled 2022-10-10: qty 100, 25d supply, fill #1
  Filled 2022-12-21: qty 100, 25d supply, fill #2
  Filled 2023-01-25: qty 100, 25d supply, fill #3

## 2022-02-23 NOTE — Telephone Encounter (Signed)
Requested medications are due for refill today.  yes ? ?Requested medications are on the active medications list.  no ? ?Last refill. 01/01/2021 #100 11 refills ? ?Future visit scheduled.   yes ? ?Notes to clinic.  Rx is expired - 01/01/2022 ? ? ? ?Requested Prescriptions  ?Pending Prescriptions Disp Refills  ? glucose blood (TRUE METRIX BLOOD GLUCOSE TEST) test strip 100 strip 11  ?  Sig: USE AS INSTRUCTED  ?  ? Endocrinology: Diabetes - Testing Supplies Passed - 02/21/2022  3:09 PM  ?  ?  Passed - Valid encounter within last 12 months  ?  Recent Outpatient Visits   ? ?      ? 1 month ago Elevated blood pressure reading  ? [place], RPH-CPP  ? 2 months ago Type 2 diabetes mellitus without complication, without long-term current use of insulin ([place])  ? [place] Karle Plumber B, MD  ? 6 months ago Type 2 diabetes mellitus without complication, without long-term current use of insulin ([place])  ? [place] Ladell Pier, MD  ? 9 months ago Type 2 diabetes mellitus without complication, without long-term current use of insulin ([place])  ? [place] Ladell Pier, MD  ? 1 year ago Type 2 diabetes mellitus without complication, without long-term current use of insulin ([place])  ? [place] Ladell Pier, MD  ? ?  ?  ?Future Appointments   ? ?        ? In 1 week Daisy Blossom, Jarome Matin, [place]  ? In 1 month Ladell Pier, MD [place]  ? ?  ? ?  ?  ?  ?Signed Prescriptions Disp Refills  ? TRUEplus Lancets 28G MISC 100 each 5  ?  Sig: Use to check blood sugar once daily.  ?  ? Endocrinology: Diabetes - Testing Supplies Passed - 02/21/2022  3:09 PM  ?  ?  Passed - Valid encounter within last 12 months  ?  Recent Outpatient Visits   ? ?      ? 1 month ago  Elevated blood pressure reading  ? [place], RPH-CPP  ? 2 months ago Type 2 diabetes mellitus without complication, without long-term current use of insulin ([place])  ? [place] Karle Plumber B, MD  ? 6 months ago Type 2 diabetes mellitus without complication, without long-term current use of insulin ([place])  ? [place] Ladell Pier, MD  ? 9 months ago Type 2 diabetes mellitus without complication, without long-term current use of insulin ([place])  ? [place] Ladell Pier, MD  ? 1 year ago Type 2 diabetes mellitus without complication, without long-term current use of insulin ([place])  ? [place] Ladell Pier, MD  ? ?  ?  ?Future Appointments   ? ?        ? In 1 week Daisy Blossom, Jarome Matin, [place]  ? In 1 month Ladell Pier, MD [place]  ? ?  ? ?  ?  ?  ?  ?

## 2022-02-23 NOTE — Telephone Encounter (Signed)
Requested Prescriptions  ?Pending Prescriptions Disp Refills  ?? TRUEplus Lancets 28G MISC 100 each 0  ?  Sig: Use to check blood sugar once daily.  ?  ? Endocrinology: Diabetes - Testing Supplies Passed - 02/21/2022  3:09 PM  ?  ?  Passed - Valid encounter within last 12 months  ?  Recent Outpatient Visits   ?      ? 1 month ago Elevated blood pressure reading  ? [place], RPH-CPP  ? 2 months ago Type 2 diabetes mellitus without complication, without long-term current use of insulin ([place])  ? [place] Karle Plumber B, MD  ? 6 months ago Type 2 diabetes mellitus without complication, without long-term current use of insulin ([place])  ? [place] Ladell Pier, MD  ? 9 months ago Type 2 diabetes mellitus without complication, without long-term current use of insulin ([place])  ? [place] Ladell Pier, MD  ? 1 year ago Type 2 diabetes mellitus without complication, without long-term current use of insulin ([place])  ? [place] Ladell Pier, MD  ?  ?  ?Future Appointments   ?        ? In 1 week Daisy Blossom, Jarome Matin, [place]  ? In 1 month Ladell Pier, MD [place]  ?  ? ?  ?  ?  ?? glucose blood (TRUE METRIX BLOOD GLUCOSE TEST) test strip 100 strip 11  ?  Sig: USE AS INSTRUCTED  ?  ? Endocrinology: Diabetes - Testing Supplies Passed - 02/21/2022  3:09 PM  ?  ?  Passed - Valid encounter within last 12 months  ?  Recent Outpatient Visits   ?      ? 1 month ago Elevated blood pressure reading  ? [place], RPH-CPP  ? 2 months ago Type 2 diabetes mellitus without complication, without long-term current use of insulin ([place])  ? [place] Karle Plumber B, MD  ? 6  months ago Type 2 diabetes mellitus without complication, without long-term current use of insulin ([place])  ? [place] Ladell Pier, MD  ? 9 months ago Type 2 diabetes mellitus without complication, without long-term current use of insulin ([place])  ? [place] Ladell Pier, MD  ? 1 year ago Type 2 diabetes mellitus without complication, without long-term current use of insulin ([place])  ? [place] Ladell Pier, MD  ?  ?  ?Future Appointments   ?        ? In 1 week Daisy Blossom, Jarome Matin, [place]  ? In 1 month Ladell Pier, MD [place]  ?  ? ?  ?  ?  ? ?

## 2022-02-24 ENCOUNTER — Other Ambulatory Visit: Payer: Self-pay

## 2022-03-04 NOTE — Progress Notes (Signed)
? ?S:    ?Cynthia Wiley is a 56 y.o. female who presents for hypertension evaluation, education, and management. PMH is significant for T2DM, hypothyroidism, elevated BP without the dx of HTN and HLD. Patient was referred and last seen by Primary Care Provider, Dr. Wynetta Emery, on 12/17/2021, when BP was 139/77. Last seen by CPP 01/24/22 when BP was 138/68.  ? ?Today, she arrives in good spirits and presents without assistance. Video was completed with use of a video Spanish interpreter Johnsie Cancel, [phone]). Denies dizziness, headache, blurred vision, swelling. Reports her tooth pain had improved but has started to come back. She is waiting to get the orange card so she can go back to the dentist. She is taking Tylenol for the pain.  ? ?Patient reports never being dx with hypertension.  ? ?Family/Social history:  ?-Fhx: DM, heart disease, HLD ?-Tobacco: never smoker  ?-Alcohol: denies use  ? ?Medication adherence: not currently taking antihypertensives  ? ?Current antihypertensives include: none ? ?Antihypertensives tried in the past include: none, NKDA  ? ?Reported home BP readings: unable to check at home ? ?Patient reported dietary habits:  ?- Denies any caffeine intake. Drinks soda only seldomly  ?- Compliant with salt restriction. Does not eat out and does not eat fast food frequently.  ? ?Patient-reported exercise habits:  ?- Active at work.  ?- Walks ~30 minutes daily outside of work ? ?O:  ?Vitals:  ? 03/07/22 1609  ?BP: (!) 144/76  ?Pulse: 74  ? ?Last 3 Office BP readings: ?BP Readings from Last 3 Encounters:  ?03/07/22 (!) 144/76  ?01/24/22 138/68  ?12/17/21 139/77  ? ?BMET ?   ?Component Value Date/Time  ? NA 140 12/17/2021 1433  ? K 4.0 12/17/2021 1433  ? CL 103 12/17/2021 1433  ? CO2 23 12/17/2021 1433  ? GLUCOSE 91 12/17/2021 1433  ? GLUCOSE 113 (H) 02/07/2017 1626  ? BUN 12 12/17/2021 1433  ? CREATININE 0.71 12/17/2021 1433  ? CREATININE 0.61 02/07/2017 1626  ? CALCIUM 9.5 12/17/2021 1433  ?  GFRNONAA 84 07/24/2020 1526  ? GFRNONAA >89 02/07/2017 1626  ? GFRAA 97 07/24/2020 1526  ? GFRAA >89 02/07/2017 1626  ? ?Renal function: ?CrCl cannot be calculated (Patient's most recent lab result is older than the maximum 21 days allowed.). ? ?Clinical ASCVD: No  ?The 10-year ASCVD risk score (Arnett DK, et al., 2019) is: 3.6% ?  Values used to calculate the score: ?    Age: 34 years ?    Sex: Female ?    Is Non-Hispanic African American: No ?    Diabetic: Yes ?    Tobacco smoker: No ?    Systolic Blood Pressure: 076 mmHg ?    Is BP treated: No ?    HDL Cholesterol: 53 mg/dL ?    Total Cholesterol: 146 mg/dL ? ?A/P: ?Hypertension undiagnosed but with multiple readings >130/80 over the past few years. BP currently elevated not on medication. Normal BP is < 120/80 mmHg. Medication adherence: not currently taking any medications. Given repeated elevated readings and SBP >140 today, will start treatment for BP.  ?-Start amlodipine 5 mg daily ?-Counseled on lifestyle modifications for blood pressure control including reduced dietary sodium, increased exercise, adequate sleep.  ?-Encouraged patient to check BP at home and bring log of readings to next visit. Counseled on proper use of home BP cuff.  ? ?Results reviewed and written information provided. Patient verbalized understanding of treatment plan. Total time in face-to-face counseling 20 minutes.  ? ?  F/u clinic visit with pharmacist in 3 weeks then PCP in May.  ? ?Rebbeca Paul, PharmD ?PGY2 Ambulatory Care Pharmacy Resident ?03/07/2022 4:26 PM ? ?

## 2022-03-07 ENCOUNTER — Ambulatory Visit: Payer: Self-pay | Attending: Internal Medicine | Admitting: Pharmacist

## 2022-03-07 ENCOUNTER — Other Ambulatory Visit: Payer: Self-pay

## 2022-03-07 ENCOUNTER — Encounter: Payer: Self-pay | Admitting: Pharmacist

## 2022-03-07 VITALS — BP 144/76 | HR 74

## 2022-03-07 DIAGNOSIS — R030 Elevated blood-pressure reading, without diagnosis of hypertension: Secondary | ICD-10-CM

## 2022-03-07 MED ORDER — AMLODIPINE BESYLATE 5 MG PO TABS
5.0000 mg | ORAL_TABLET | Freq: Every day | ORAL | 2 refills | Status: DC
  Filled 2022-03-07: qty 30, 30d supply, fill #0
  Filled 2022-03-28: qty 30, 30d supply, fill #1

## 2022-03-24 ENCOUNTER — Other Ambulatory Visit: Payer: Self-pay | Admitting: Internal Medicine

## 2022-03-24 ENCOUNTER — Other Ambulatory Visit: Payer: Self-pay

## 2022-03-24 DIAGNOSIS — E785 Hyperlipidemia, unspecified: Secondary | ICD-10-CM

## 2022-03-24 MED ORDER — ATORVASTATIN CALCIUM 40 MG PO TABS
ORAL_TABLET | Freq: Every day | ORAL | 2 refills | Status: DC
  Filled 2022-03-24: qty 90, 90d supply, fill #0

## 2022-03-24 NOTE — Progress Notes (Signed)
? ?  S:    ?Cynthia Wiley is a 56 y.o. female who presents for hypertension evaluation, education, and management. PMH is significant for T2DM, hypothyroidism, elevated BP without the dx of HTN and HLD. Patient was referred and last seen by Primary Care Provider, Dr. Wynetta Emery, on 12/17/2021, when BP was 139/77. BP at last CPP visit 03/07/22 was 144/76 and amlodipine was started.  ? ?Today, she arrives in good spirits and presents without assistance. Denies dizziness, headache, blurred vision, swelling. No side effects to the amlodipine. ? ?Family/Social history:  ?-Fhx: DM, heart disease, HLD ?-Tobacco: never smoker  ?-Alcohol: denies use  ? ?Medication adherence reported. ? ?Current antihypertensives include: amlodipine 5 mg daily ? ?Antihypertensives tried in the past include: none, NKDA  ? ?Reported home BP readings: unable to check at home ? ?Patient reported dietary habits:  ?- Denies any caffeine intake. Drinks soda only seldomly  ?- Compliant with salt restriction. Does not eat out and does not eat fast food frequently.  ? ?Patient-reported exercise habits:  ?- Active at work.  ?- Walks ~30 minutes daily outside of work ? ?O:  ?Vitals:  ? 03/28/22 1632  ?BP: 115/65  ?Pulse: 93  ? ?Last 3 Office BP readings: ?BP Readings from Last 3 Encounters:  ?03/28/22 115/65  ?03/07/22 (!) 144/76  ?01/24/22 138/68  ? ?BMET ?   ?Component Value Date/Time  ? NA 140 12/17/2021 1433  ? K 4.0 12/17/2021 1433  ? CL 103 12/17/2021 1433  ? CO2 23 12/17/2021 1433  ? GLUCOSE 91 12/17/2021 1433  ? GLUCOSE 113 (H) 02/07/2017 1626  ? BUN 12 12/17/2021 1433  ? CREATININE 0.71 12/17/2021 1433  ? CREATININE 0.61 02/07/2017 1626  ? CALCIUM 9.5 12/17/2021 1433  ? GFRNONAA 84 07/24/2020 1526  ? GFRNONAA >89 02/07/2017 1626  ? GFRAA 97 07/24/2020 1526  ? GFRAA >89 02/07/2017 1626  ? ?Renal function: ?CrCl cannot be calculated (Patient's most recent lab result is older than the maximum 21 days allowed.). ? ?Clinical ASCVD: No  ?The  10-year ASCVD risk score (Arnett DK, et al., 2019) is: 2.3% ?  Values used to calculate the score: ?    Age: 2 years ?    Sex: Female ?    Is Non-Hispanic African American: No ?    Diabetic: Yes ?    Tobacco smoker: No ?    Systolic Blood Pressure: 144 mmHg ?    Is BP treated: No ?    HDL Cholesterol: 53 mg/dL ?    Total Cholesterol: 146 mg/dL ? ?A/P: ?Hypertension currently controlled. BP today at goal of <130/80 mmHg. Medication adherence reported. ?-Continue amlodipine 5 mg daily ?-Counseled on lifestyle modifications for blood pressure control including reduced dietary sodium, increased exercise, adequate sleep.  ?-Encouraged patient to check BP at home and bring log of readings to next visit. Counseled on proper use of home BP cuff.  ? ?Results reviewed and written information provided. Patient verbalized understanding of treatment plan. Total time in face-to-face counseling 20 minutes.  ? ?F/u clinic visit PCP in May.  ? ?Rebbeca Paul, PharmD ?PGY2 Ambulatory Care Pharmacy Resident ?03/28/2022 4:36 PM ? ?Benard Halsted, PharmD, BCACP, CPP ?Clinical Pharmacist ?[place] ?[phone] ? ? ?

## 2022-03-25 ENCOUNTER — Other Ambulatory Visit: Payer: Self-pay

## 2022-03-28 ENCOUNTER — Encounter: Payer: Self-pay | Admitting: Pharmacist

## 2022-03-28 ENCOUNTER — Other Ambulatory Visit: Payer: Self-pay

## 2022-03-28 ENCOUNTER — Ambulatory Visit: Payer: Self-pay | Attending: Internal Medicine | Admitting: Pharmacist

## 2022-03-28 VITALS — BP 115/65 | HR 93

## 2022-03-28 DIAGNOSIS — R030 Elevated blood-pressure reading, without diagnosis of hypertension: Secondary | ICD-10-CM

## 2022-04-18 ENCOUNTER — Other Ambulatory Visit: Payer: Self-pay

## 2022-04-18 ENCOUNTER — Ambulatory Visit: Payer: Self-pay | Attending: Internal Medicine | Admitting: Internal Medicine

## 2022-04-18 VITALS — BP 126/72 | HR 66 | Temp 98.3°F | Resp 18 | Wt 137.0 lb

## 2022-04-18 DIAGNOSIS — E119 Type 2 diabetes mellitus without complications: Secondary | ICD-10-CM

## 2022-04-18 DIAGNOSIS — E785 Hyperlipidemia, unspecified: Secondary | ICD-10-CM

## 2022-04-18 DIAGNOSIS — Z23 Encounter for immunization: Secondary | ICD-10-CM

## 2022-04-18 DIAGNOSIS — E039 Hypothyroidism, unspecified: Secondary | ICD-10-CM

## 2022-04-18 DIAGNOSIS — I10 Essential (primary) hypertension: Secondary | ICD-10-CM

## 2022-04-18 LAB — POCT GLYCOSYLATED HEMOGLOBIN (HGB A1C): Hemoglobin A1C: 7.7 % — AB (ref 4.0–5.6)

## 2022-04-18 LAB — GLUCOSE, POCT (MANUAL RESULT ENTRY): POC Glucose: 93 mg/dl (ref 70–99)

## 2022-04-18 MED ORDER — ATORVASTATIN CALCIUM 40 MG PO TABS
ORAL_TABLET | Freq: Every day | ORAL | 6 refills | Status: DC
  Filled 2022-04-18: qty 30, fill #0
  Filled 2022-06-02 – 2022-07-04 (×2): qty 30, 30d supply, fill #0
  Filled 2022-08-01: qty 30, 30d supply, fill #1
  Filled 2022-09-06 (×2): qty 30, 30d supply, fill #2
  Filled 2022-10-10: qty 30, 30d supply, fill #3
  Filled 2022-11-08: qty 30, 30d supply, fill #4
  Filled 2022-12-21: qty 30, 30d supply, fill #5
  Filled 2023-01-25: qty 30, 30d supply, fill #6

## 2022-04-18 MED ORDER — LEVOTHYROXINE SODIUM 50 MCG PO TABS
ORAL_TABLET | ORAL | 4 refills | Status: DC
  Filled 2022-04-18: qty 45, fill #0
  Filled 2022-04-20: qty 45, 30d supply, fill #0
  Filled 2022-06-02: qty 45, 30d supply, fill #1
  Filled 2022-09-06 (×2): qty 45, 30d supply, fill #2
  Filled 2022-10-10: qty 45, 30d supply, fill #3
  Filled 2022-11-08: qty 45, 30d supply, fill #4

## 2022-04-18 MED ORDER — METFORMIN HCL 1000 MG PO TABS
ORAL_TABLET | Freq: Two times a day (BID) | ORAL | 6 refills | Status: DC
  Filled 2022-04-18: qty 60, fill #0
  Filled 2022-04-20: qty 180, 90d supply, fill #0
  Filled 2022-08-01: qty 180, 90d supply, fill #1
  Filled 2022-09-06: qty 180, 90d supply, fill #2
  Filled 2022-10-10: qty 60, 30d supply, fill #2

## 2022-04-18 MED ORDER — AMLODIPINE BESYLATE 5 MG PO TABS
5.0000 mg | ORAL_TABLET | Freq: Every day | ORAL | 6 refills | Status: DC
  Filled 2022-04-18 – 2022-05-09 (×2): qty 30, 30d supply, fill #0
  Filled 2022-06-02: qty 30, 30d supply, fill #1
  Filled 2022-07-04: qty 30, 30d supply, fill #2
  Filled 2022-08-01: qty 30, 30d supply, fill #3
  Filled 2022-09-06 (×2): qty 30, 30d supply, fill #4
  Filled 2022-10-10: qty 30, 30d supply, fill #5
  Filled 2022-11-08: qty 30, 30d supply, fill #6

## 2022-04-18 NOTE — Progress Notes (Signed)
Pt presents for diabetes f/u and med refills  ?

## 2022-04-18 NOTE — Progress Notes (Signed)
? ? ?Patient ID: Cynthia Wiley, female    DOB: [birth year]  MRN: [id number] ? ?CC: Diabetes ? ? ?Subjective: ?Cynthia Wiley is a 56 y.o. female who presents for chronic disease management. ?Her concerns today include:  ?pt with hx of  DM type 2, HL, iron def and hypothyroid ? ?DIABETES TYPE 2 ?Last A1C:   ?Results for orders placed or performed in visit on 04/18/22  ?POCT glycosylated hemoglobin (Hb A1C)  ?Result Value Ref Range  ? Hemoglobin A1C 7.7 (A) 4.0 - 5.6 %  ? HbA1c POC (<> result, manual entry)    ? HbA1c, POC (prediabetic range)    ? HbA1c, POC (controlled diabetic range)    ?POCT glucose (manual entry)  ?Result Value Ref Range  ? POC Glucose 93 70 - 99 mg/dl  ? A1C down 1 point from 4 mths ago ?Med Adherence: Taking Metformin but not [place]. Reports it causes BS to drop too low. However she took it only once since last visit with me ?Medication side effects:  '[]'  Yes    '[]'  No ?Home Monitoring?  '[x]'  Yes    '[]'  No ?Home glucose results range:95-110 in the a.m before BF ?Diet Adherence: '[x]'  Yes    '[]'  No ?Exercise: '[x]'  Yes    '[]'  No ? ?HL: Taking and tolerating atorvastatin ? ?HTN;  taking and tolerating Norvasc.  She limits salt in the foods. ? ?Hypothyroid: Reports compliance with levothyroxine.  Last TSH was at goal. ? ? ?Patient Active Problem List  ? Diagnosis Date Noted  ? Anemia 02/14/2019  ? Hyperlipidemia 08/07/2017  ? DM2 (diabetes mellitus, type 2) ([place]) 01/22/2015  ? Hypothyroidism 01/22/2015  ?  ? ?Current Outpatient Medications on File Prior to Visit  ?Medication Sig Dispense Refill  ? amLODipine (NORVASC) 5 MG tablet Take 1 tablet (5 mg total) by mouth daily. 30 tablet 2  ? aspirin EC 81 MG tablet Take 1 tablet (81 mg total) by mouth daily. 100 tablet 1  ? atorvastatin (LIPITOR) 40 MG tablet TAKE 1 TABLET (40 MG TOTAL) BY MOUTH DAILY. 30 tablet 2  ? Blood Glucose Monitoring Suppl (TRUE METRIX METER) w/Device KIT Use as directed as needed. 1 kit 0  ? dapagliflozin propanediol  (FARXIGA) 5 MG TABS tablet Take 1 tablet (5 mg total) by mouth daily before breakfast. 30 tablet 4  ? ferrous sulfate 325 (65 FE) MG tablet TAKE 1 TABLET (325 MG TOTAL) BY MOUTH DAILY WITH BREAKFAST. 100 tablet 1  ? glucose blood (TRUE METRIX BLOOD GLUCOSE TEST) test strip USE AS INSTRUCTED 100 strip 11  ? levothyroxine (SYNTHROID) 50 MCG tablet TAKE 1 & 1/2 TABLETS (75 MCG TOTAL) BY MOUTH DAILY. 45 tablet 2  ? metFORMIN (GLUCOPHAGE) 1000 MG tablet TAKE 1 TABLET (1,000 MG TOTAL) BY MOUTH 2 (TWO) TIMES DAILY WITH A MEAL. 60 tablet 2  ? TRUEplus Lancets 28G MISC Use to check blood sugar once daily. 100 each 5  ? ?No current facility-administered medications on file prior to visit.  ? ? ?Allergies  ?Allergen Reactions  ? Onglyza [Saxagliptin] Nausea Only  ? Sulfonylureas Other (See Comments)  ?  Severe stomach ache with Amaryl and glipizide XL   ? ? ?Social History  ? ?Socioeconomic History  ? Marital status: Single  ?  Spouse name: Not on file  ? Number of children: Not on file  ? Years of education: Not on file  ? Highest education level: Not on file  ?Occupational History  ? Not  on file  ?Tobacco Use  ? Smoking status: Never  ? Smokeless tobacco: Never  ?Vaping Use  ? Vaping Use: Never used  ?Substance and Sexual Activity  ? Alcohol use: No  ? Drug use: No  ? Sexual activity: Yes  ?  Birth control/protection: None  ?Other Topics Concern  ? Not on file  ?Social History Narrative  ? Not on file  ? ?Social Determinants of Health  ? ?Financial Resource Strain: Not on file  ?Food Insecurity: Not on file  ?Transportation Needs: Not on file  ?Physical Activity: Not on file  ?Stress: Not on file  ?Social Connections: Not on file  ?Intimate Partner Violence: Not on file  ? ? ?Family History  ?Problem Relation Age of Onset  ? Diabetes Father   ? Heart disease Father   ? Diabetes Sister   ? Hyperlipidemia Brother   ? ? ?Past Surgical History:  ?Procedure Laterality Date  ? ABDOMINAL HYSTERECTOMY N/A 01/31/2013  ? Procedure:  HYSTERECTOMY ABDOMINAL;  Surgeon: Lavonia Drafts, MD;  Location: [facility];  Service: Gynecology;  Laterality: N/A;  ? BILATERAL SALPINGECTOMY Bilateral 01/31/2013  ? Procedure: BILATERAL SALPINGECTOMY;  Surgeon: Lavonia Drafts, MD;  Location: [facility];  Service: Gynecology;  Laterality: Bilateral;  ? CYSTOSCOPY WITH STENT PLACEMENT Bilateral 01/31/2013  ? Procedure: CYSTOSCOPY WITH STENT PLACEMENT;  Surgeon: Malka So, MD;  Location: [facility];  Service: Urology;  Laterality: Bilateral;  with c-arm  ? ? ?ROS: ?Review of Systems ?Negative except as stated above ? ?PHYSICAL EXAM: ?BP 126/72 (BP Location: Left Arm, Patient Position: Sitting, Cuff Size: Normal)   Pulse 66   Temp 98.3 ?F (36.8 ?C)   Resp 18   Wt 137 lb (62.1 kg)   LMP 01/19/2013   SpO2 100%   BMI 25.06 kg/m?   ?Physical Exam ? ?General appearance - alert, well appearing, and in no distress ?Mental status - normal mood, behavior, speech, dress, motor activity, and thought processes ?Neck - supple, no significant adenopathy ?Chest - clear to auscultation, no wheezes, rales or rhonchi, symmetric air entry ?Heart - normal rate, regular rhythm, normal S1, S2, no murmurs, rubs, clicks or gallops ?Extremities - peripheral pulses normal, no pedal edema, no clubbing or cyanosis ? ? ? ?  Latest Ref Rng & Units 12/17/2021  ?  2:33 PM 08/13/2021  ?  3:14 PM 05/07/2021  ?  2:51 PM  ?CMP  ?Glucose 70 - 99 mg/dL 91   115   93    ?BUN 6 - 24 mg/dL '12   12   15    ' ?Creatinine 0.57 - 1.00 mg/dL 0.71   0.64   0.73    ?Sodium 134 - 144 mmol/L 140   142   142    ?Potassium 3.5 - 5.2 mmol/L 4.0   4.9   5.4    ?Chloride 96 - 106 mmol/L 103   102   104    ?CO2 20 - 29 mmol/L '23   23   24    ' ?Calcium 8.7 - 10.2 mg/dL 9.5   10.1   10.0    ?Total Protein 6.0 - 8.5 g/dL 6.9    6.9    ?Total Bilirubin 0.0 - 1.2 mg/dL 0.6    0.6    ?Alkaline Phos 44 - 121 IU/L 101    98    ?AST 0 - 40 IU/L 23    21    ?ALT 0 - 32 IU/L 18    12    ? ?  Lipid Panel  ?   ?Component Value  Date/Time  ? CHOL 146 05/07/2021 1451  ? TRIG 87 05/07/2021 1451  ? HDL 53 05/07/2021 1451  ? CHOLHDL 2.8 05/07/2021 1451  ? CHOLHDL 2.7 02/07/2017 1626  ? VLDL 17 02/07/2017 1626  ? LDLCALC 77 05/07/2021 1451  ? ? ?CBC ?   ?Component Value Date/Time  ? WBC 8.2 12/17/2021 1433  ? WBC 7.1 01/22/2015 1047  ? RBC 4.20 12/17/2021 1433  ? RBC 4.73 01/22/2015 1047  ? HGB 11.0 (L) 12/17/2021 1433  ? HCT 34.2 12/17/2021 1433  ? PLT 353 12/17/2021 1433  ? MCV 81 12/17/2021 1433  ? MCH 26.2 (L) 12/17/2021 1433  ? MCH 26.8 01/22/2015 1047  ? MCHC 32.2 12/17/2021 1433  ? MCHC 32.0 01/22/2015 1047  ? RDW 13.9 12/17/2021 1433  ? LYMPHSABS 2.4 01/06/2009 2230  ? MONOABS 0.6 01/06/2009 2230  ? EOSABS 0.1 01/06/2009 2230  ? BASOSABS 0.0 01/06/2009 2230  ? ? ?ASSESSMENT AND PLAN: ? ?1. Type 2 diabetes mellitus without complication, without long-term current use of insulin ([place]) ?Not at goal but improved. ?Advised patient that taking the [place] once was really not an adequate trial of the medication.  Recommend she tries taking the medicine 3 times a week.  Went over signs and symptoms of hypoglycemia and how to treat.  She is agreeable to trying the [place] 3 times a week.  She will continue the metformin.  Continue healthy eating habits and regular exercise. ?- POCT glycosylated hemoglobin (Hb A1C) ?- POCT glucose (manual entry) ?- metFORMIN (GLUCOPHAGE) 1000 MG tablet; TAKE 1 TABLET (1,000 MG TOTAL) BY MOUTH 2 (TWO) TIMES DAILY WITH A MEAL.  Dispense: 60 tablet; Refill: 6 ? ?2. Acquired hypothyroidism ?Continue current dose of levothyroxine. ?- levothyroxine (SYNTHROID) 50 MCG tablet; TAKE 1 & 1/2 TABLETS (75 MCG TOTAL) BY MOUTH DAILY.  Dispense: 45 tablet; Refill: 4 ? ?3. Hyperlipidemia, unspecified hyperlipidemia type ?Continue atorvastatin. ?- atorvastatin (LIPITOR) 40 MG tablet; TAKE 1 TABLET (40 MG TOTAL) BY MOUTH DAILY.  Dispense: 30 tablet; Refill: 6 ? ?4. Essential hypertension ?At goal.  Continue Norvasc. ?- amLODipine  (NORVASC) 5 MG tablet; Take 1 tablet (5 mg total) by mouth daily.  Dispense: 30 tablet; Refill: 6 ? ?5. Need for shingles vaccine ?She was agreeable to receiving the first shot of Shingrix today.  However we were out of it

## 2022-04-20 ENCOUNTER — Other Ambulatory Visit: Payer: Self-pay

## 2022-05-09 ENCOUNTER — Other Ambulatory Visit: Payer: Self-pay

## 2022-06-02 ENCOUNTER — Other Ambulatory Visit: Payer: Self-pay

## 2022-07-04 ENCOUNTER — Other Ambulatory Visit: Payer: Self-pay

## 2022-07-06 ENCOUNTER — Other Ambulatory Visit: Payer: Self-pay

## 2022-08-01 ENCOUNTER — Other Ambulatory Visit: Payer: Self-pay

## 2022-08-19 ENCOUNTER — Ambulatory Visit: Payer: Self-pay | Attending: Internal Medicine | Admitting: Internal Medicine

## 2022-08-19 ENCOUNTER — Other Ambulatory Visit: Payer: Self-pay

## 2022-08-19 ENCOUNTER — Ambulatory Visit: Payer: Self-pay | Admitting: Internal Medicine

## 2022-08-19 ENCOUNTER — Encounter: Payer: Self-pay | Admitting: Internal Medicine

## 2022-08-19 VITALS — BP 144/78 | HR 72 | Temp 98.0°F | Ht 62.0 in | Wt 145.8 lb

## 2022-08-19 DIAGNOSIS — Z1231 Encounter for screening mammogram for malignant neoplasm of breast: Secondary | ICD-10-CM

## 2022-08-19 DIAGNOSIS — E785 Hyperlipidemia, unspecified: Secondary | ICD-10-CM

## 2022-08-19 DIAGNOSIS — E039 Hypothyroidism, unspecified: Secondary | ICD-10-CM

## 2022-08-19 DIAGNOSIS — Z23 Encounter for immunization: Secondary | ICD-10-CM

## 2022-08-19 DIAGNOSIS — E119 Type 2 diabetes mellitus without complications: Secondary | ICD-10-CM

## 2022-08-19 DIAGNOSIS — E611 Iron deficiency: Secondary | ICD-10-CM

## 2022-08-19 LAB — POCT GLYCOSYLATED HEMOGLOBIN (HGB A1C): HbA1c, POC (controlled diabetic range): 8.1 % — AB (ref 0.0–7.0)

## 2022-08-19 LAB — GLUCOSE, POCT (MANUAL RESULT ENTRY): POC Glucose: 113 mg/dl — AB (ref 70–99)

## 2022-08-19 MED ORDER — FERROUS SULFATE 325 (65 FE) MG PO TABS
325.0000 mg | ORAL_TABLET | Freq: Every day | ORAL | 1 refills | Status: DC
  Filled 2022-08-19: qty 100, 100d supply, fill #0

## 2022-08-19 NOTE — Progress Notes (Signed)
Patient ID: Cynthia Wiley, female    DOB: November 16, 1966  MRN: [id number]  CC: Diabetes   Subjective: Cynthia Wiley is a 55 y.o. female who presents for chronic ds management Her concerns today include:  pt with hx of  DM type 2, HTN, HL, iron def and hypothyroid   HM:  agrees to get flu shot today.  Needs Shingrix.  Due for MMG  DM: Results for orders placed or performed in visit on 08/19/22  POCT glucose (manual entry)  Result Value Ref Range   POC Glucose 113 (A) 70 - 99 mg/dl  POCT glycosylated hemoglobin (Hb A1C)  Result Value Ref Range   Hemoglobin A1C     HbA1c POC (<> result, manual entry)     HbA1c, POC (prediabetic range)     HbA1c, POC (controlled diabetic range) 8.1 (A) 0.0 - 7.0 %  A1C continues to increase. Checks BS in a.m before BF.  Gives range 110-120 with highest of 140.  Taking Metformin.  On last visit, we discussed having her restart farxiga and take it 3x/wk.  However, she has not taken it because she reports it drops her BS too low.  Pt took it only one time.  She does not want extra medication -due for DM eye exam.  She plans to call and schedule when she gets the funds  HL: reports taking Lipitor consistently and tolerating the medication  HTN:  taking Norvasc 5 mg daily No CP/SOB/LE edema  Limits salt in foods  Hypothyroid:  taking Levothyroxine 75 mcg daily.  Last TSH in January of this year was 0.84.  Anemia:  out of iron supplement.  Out since last mth.  Last H/H was 11/34.2 in January of this year.  Denies any dizziness.  Patient Active Problem List   Diagnosis Date Noted   Anemia 02/14/2019   Hyperlipidemia 08/07/2017   DM2 (diabetes mellitus, type 2) ([place]) 01/22/2015   Hypothyroidism 01/22/2015     Current Outpatient Medications on File Prior to Visit  Medication Sig Dispense Refill   amLODipine (NORVASC) 5 MG tablet Take 1 tablet (5 mg total) by mouth daily. 30 tablet 6   aspirin EC 81 MG tablet Take 1 tablet (81 mg  total) by mouth daily. 100 tablet 1   atorvastatin (LIPITOR) 40 MG tablet TAKE 1 TABLET (40 MG TOTAL) BY MOUTH DAILY. 30 tablet 6   Blood Glucose Monitoring Suppl (TRUE METRIX METER) w/Device KIT Use as directed as needed. 1 kit 0   glucose blood (TRUE METRIX BLOOD GLUCOSE TEST) test strip USE AS INSTRUCTED 100 strip 11   levothyroxine (SYNTHROID) 50 MCG tablet TAKE 1 & 1/2 TABLETS (75 MCG TOTAL) BY MOUTH DAILY. 45 tablet 4   metFORMIN (GLUCOPHAGE) 1000 MG tablet TAKE 1 TABLET (1,000 MG TOTAL) BY MOUTH 2 (TWO) TIMES DAILY WITH A MEAL. 60 tablet 6   TRUEplus Lancets 28G MISC Use to check blood sugar once daily. 100 each 5   No current facility-administered medications on file prior to visit.    Allergies  Allergen Reactions   Onglyza [Saxagliptin] Nausea Only   Sulfonylureas Other (See Comments)    Severe stomach ache with Amaryl and glipizide XL     Social History   Socioeconomic History   Marital status: Single    Spouse name: Not on file   Number of children: Not on file   Years of education: Not on file   Highest education level: Not on file  Occupational  History   Not on file  Tobacco Use   Smoking status: Never   Smokeless tobacco: Never  Vaping Use   Vaping Use: Never used  Substance and Sexual Activity   Alcohol use: No   Drug use: No   Sexual activity: Yes    Birth control/protection: None  Other Topics Concern   Not on file  Social History Narrative   Not on file   Social Determinants of Health   Financial Resource Strain: Not on file  Food Insecurity: Not on file  Transportation Needs: Not on file  Physical Activity: Unknown (02/01/2018)   Exercise Vital Sign    Days of Exercise per Week: Patient refused    Minutes of Exercise per Session: Patient refused  Stress: Not on file  Social Connections: Unknown (02/01/2018)   Social Connection and Isolation Panel [NHANES]    Frequency of Communication with Friends and Family: Patient refused    Frequency of  Social Gatherings with Friends and Family: Patient refused    Attends Religious Services: Patient refused    Active Member of Clubs or Organizations: Patient refused    Attends Archivist Meetings: Patient refused    Marital Status: Patient refused  Intimate Partner Violence: Unknown (02/01/2018)   Humiliation, Afraid, Rape, and Kick questionnaire    Fear of Current or Ex-Partner: Patient refused    Emotionally Abused: Patient refused    Physically Abused: Patient refused    Sexually Abused: Patient refused    Family History  Problem Relation Age of Onset   Diabetes Father    Heart disease Father    Diabetes Sister    Hyperlipidemia Brother     Past Surgical History:  Procedure Laterality Date   ABDOMINAL HYSTERECTOMY N/A 01/31/2013   Procedure: HYSTERECTOMY ABDOMINAL;  Surgeon: Lavonia Drafts, MD;  Location: [facility];  Service: Gynecology;  Laterality: N/A;   BILATERAL SALPINGECTOMY Bilateral 01/31/2013   Procedure: BILATERAL SALPINGECTOMY;  Surgeon: Lavonia Drafts, MD;  Location: [facility];  Service: Gynecology;  Laterality: Bilateral;   CYSTOSCOPY WITH STENT PLACEMENT Bilateral 01/31/2013   Procedure: CYSTOSCOPY WITH STENT PLACEMENT;  Surgeon: Malka So, MD;  Location: [facility];  Service: Urology;  Laterality: Bilateral;  with c-arm    ROS: Review of Systems Negative except as stated above  PHYSICAL EXAM: BP (!) 144/78   Pulse 72   Temp 98 F (36.7 C) (Oral)   Ht '5\' 2"'  (1.575 m)   Wt 145 lb 12.8 oz (66.1 kg)   LMP 01/19/2013   SpO2 99%   BMI 26.67 kg/m   Physical Exam  General appearance - alert, well appearing, middle-aged Hispanic female and in no distress Mental status - normal mood, behavior, speech, dress, motor activity, and thought processes Neck - supple, no significant adenopathy Chest - clear to auscultation, no wheezes, rales or rhonchi, symmetric air entry Heart - normal rate, regular rhythm, normal S1, S2, no murmurs, rubs, clicks  or gallops Extremities - peripheral pulses normal, no pedal edema, no clubbing or cyanosis Diabetic Foot Exam - Simple   Simple Foot Form Diabetic Foot exam was performed with the following findings: Yes 08/19/2022  3:21 PM  Visual Inspection See comments: Yes Sensation Testing Intact to touch and monofilament testing bilaterally: Yes Pulse Check Posterior Tibialis and Dorsalis pulse intact bilaterally: Yes Comments Toenail on RT big toe is chip and irregular.         Latest Ref Rng & Units 12/17/2021    2:33 PM 08/13/2021  3:14 PM 05/07/2021    2:51 PM  CMP  Glucose 70 - 99 mg/dL 91  115  93   BUN 6 - 24 mg/dL '12  12  15   ' Creatinine 0.57 - 1.00 mg/dL 0.71  0.64  0.73   Sodium 134 - 144 mmol/L 140  142  142   Potassium 3.5 - 5.2 mmol/L 4.0  4.9  5.4   Chloride 96 - 106 mmol/L 103  102  104   CO2 20 - 29 mmol/L '23  23  24   ' Calcium 8.7 - 10.2 mg/dL 9.5  10.1  10.0   Total Protein 6.0 - 8.5 g/dL 6.9   6.9   Total Bilirubin 0.0 - 1.2 mg/dL 0.6   0.6   Alkaline Phos 44 - 121 IU/L 101   98   AST 0 - 40 IU/L 23   21   ALT 0 - 32 IU/L 18   12    Lipid Panel     Component Value Date/Time   CHOL 146 05/07/2021 1451   TRIG 87 05/07/2021 1451   HDL 53 05/07/2021 1451   CHOLHDL 2.8 05/07/2021 1451   CHOLHDL 2.7 02/07/2017 1626   VLDL 17 02/07/2017 1626   LDLCALC 77 05/07/2021 1451    CBC    Component Value Date/Time   WBC 8.2 12/17/2021 1433   WBC 7.1 01/22/2015 1047   RBC 4.20 12/17/2021 1433   RBC 4.73 01/22/2015 1047   HGB 11.0 (L) 12/17/2021 1433   HCT 34.2 12/17/2021 1433   PLT 353 12/17/2021 1433   MCV 81 12/17/2021 1433   MCH 26.2 (L) 12/17/2021 1433   MCH 26.8 01/22/2015 1047   MCHC 32.2 12/17/2021 1433   MCHC 32.0 01/22/2015 1047   RDW 13.9 12/17/2021 1433   LYMPHSABS 2.4 01/06/2009 2230   MONOABS 0.6 01/06/2009 2230   EOSABS 0.1 01/06/2009 2230   BASOSABS 0.0 01/06/2009 2230    ASSESSMENT AND PLAN: 1. Type 2 diabetes mellitus without complication,  without long-term current use of insulin ([place]) Not at goal.  Patient not willing to take [place] or any other medication.  She will continue current dose of metformin.  Encouraged her to continue healthy eating habits.  Try to move as much as she can. - POCT glucose (manual entry) - POCT glycosylated hemoglobin (Hb A1C)  2. Hyperlipidemia, unspecified hyperlipidemia type Continue atorvastatin.  Overdue for lipid profile check. - Lipid panel  3. Acquired hypothyroidism Continue levothyroxine 88 mcg daily. - TSH  4. Iron deficiency Recheck CBC today. - CBC - ferrous sulfate 325 (65 FE) MG tablet; Take 1 tablet (325 mg total) by mouth daily with breakfast.  Dispense: 100 tablet; Refill: 1  5. Encounter for screening mammogram for malignant neoplasm of breast - MM Digital Screening; Future  6. Need for immunization against influenza - Flu Vaccine QUAD 77moIM (Fluarix, Fluzone & Alfiuria Quad PF)   [employer] Language interpreter used during this encounter. ##[id number] Patient was given the opportunity to ask questions.  Patient verbalized understanding of the plan and was able to repeat key elements of the plan.   This documentation was completed using DRadio producer  Any transcriptional errors are unintentional.  Orders Placed This Encounter  Procedures   MM Digital Screening   Flu Vaccine QUAD 646moM (Fluarix, Fluzone & Alfiuria Quad PF)   CBC   Lipid panel   TSH   POCT glucose (manual entry)   POCT glycosylated hemoglobin (Hb A1C)  Requested Prescriptions   Signed Prescriptions Disp Refills   ferrous sulfate 325 (65 FE) MG tablet 100 tablet 1    Sig: Take 1 tablet (325 mg total) by mouth daily with breakfast.    Return in about 4 months (around 12/19/2022) for Appt with [facility] in 2 wks for shingles vaccine.  Karle Plumber, MD, FACP

## 2022-08-20 LAB — CBC
Hematocrit: 36.3 % (ref 34.0–46.6)
Hemoglobin: 12.1 g/dL (ref 11.1–15.9)
MCH: 27.5 pg (ref 26.6–33.0)
MCHC: 33.3 g/dL (ref 31.5–35.7)
MCV: 83 fL (ref 79–97)
Platelets: 365 10*3/uL (ref 150–450)
RBC: 4.4 x10E6/uL (ref 3.77–5.28)
RDW: 13.4 % (ref 11.7–15.4)
WBC: 8.3 10*3/uL (ref 3.4–10.8)

## 2022-08-20 LAB — LIPID PANEL
Chol/HDL Ratio: 4.1 ratio (ref 0.0–4.4)
Cholesterol, Total: 260 mg/dL — ABNORMAL HIGH (ref 100–199)
HDL: 64 mg/dL (ref >39)
LDL Chol Calc (NIH): 174 mg/dL — ABNORMAL HIGH (ref 0–99)
Triglycerides: 124 mg/dL (ref 0–149)
VLDL Cholesterol Cal: 22 mg/dL (ref 5–40)

## 2022-08-20 LAB — TSH: TSH: 0.798 u[IU]/mL (ref 0.450–4.500)

## 2022-08-21 NOTE — Progress Notes (Signed)
Let pt know that her cholesterol is very elevated at 174 with goal being less than 70.  Please inquire whether she has been taking the Atorvastatin 40 mg consistently for the past 2 months.  If she has been taking it consistently, then we need to increase the dose to 40 mg daily.  Please let me know her response.  Blood cell counts are normal.  Thyroid level is normal.

## 2022-09-06 ENCOUNTER — Other Ambulatory Visit: Payer: Self-pay

## 2022-09-07 ENCOUNTER — Other Ambulatory Visit: Payer: Self-pay

## 2022-09-08 ENCOUNTER — Ambulatory Visit
Admission: RE | Admit: 2022-09-08 | Discharge: 2022-09-08 | Disposition: A | Discharge status: Home / Self Care | Payer: Self-pay | Source: Ambulatory Visit | Attending: Internal Medicine | Admitting: Internal Medicine

## 2022-09-08 DIAGNOSIS — Z1231 Encounter for screening mammogram for malignant neoplasm of breast: Secondary | ICD-10-CM

## 2022-09-15 ENCOUNTER — Telehealth: Payer: Self-pay

## 2022-09-15 NOTE — Telephone Encounter (Signed)
Pt returned call for imaging results.  Shared provider's  note.  Ladell Pier, MD  09/10/2022 11:36 AM EDT     Mammogram is normal.  Will  be due again in 1 year.     No questions at this time.

## 2022-09-22 ENCOUNTER — Ambulatory Visit: Payer: Self-pay | Admitting: Pharmacist

## 2022-10-10 ENCOUNTER — Encounter: Payer: Self-pay | Admitting: *Deleted

## 2022-10-10 ENCOUNTER — Other Ambulatory Visit: Payer: Self-pay

## 2022-11-08 ENCOUNTER — Other Ambulatory Visit: Payer: Self-pay | Admitting: Internal Medicine

## 2022-11-08 ENCOUNTER — Other Ambulatory Visit: Payer: Self-pay

## 2022-11-08 DIAGNOSIS — E119 Type 2 diabetes mellitus without complications: Secondary | ICD-10-CM

## 2022-11-08 MED ORDER — METFORMIN HCL 1000 MG PO TABS
ORAL_TABLET | Freq: Two times a day (BID) | ORAL | 1 refills | Status: DC
  Filled 2022-11-08: qty 60, 30d supply, fill #0
  Filled 2022-12-21 (×2): qty 60, 30d supply, fill #1

## 2022-11-22 ENCOUNTER — Other Ambulatory Visit: Payer: Self-pay

## 2022-12-21 ENCOUNTER — Other Ambulatory Visit: Payer: Self-pay | Admitting: Internal Medicine

## 2022-12-21 ENCOUNTER — Other Ambulatory Visit: Payer: Self-pay

## 2022-12-21 DIAGNOSIS — E039 Hypothyroidism, unspecified: Secondary | ICD-10-CM

## 2022-12-21 DIAGNOSIS — I10 Essential (primary) hypertension: Secondary | ICD-10-CM

## 2022-12-21 MED ORDER — LEVOTHYROXINE SODIUM 50 MCG PO TABS
ORAL_TABLET | ORAL | 3 refills | Status: DC
  Filled 2022-12-21: qty 45, 30d supply, fill #0
  Filled 2023-01-25: qty 45, 30d supply, fill #1
  Filled 2023-03-23: qty 45, 30d supply, fill #2
  Filled 2023-04-25 (×2): qty 45, 30d supply, fill #3

## 2022-12-21 MED ORDER — AMLODIPINE BESYLATE 5 MG PO TABS
5.0000 mg | ORAL_TABLET | Freq: Every day | ORAL | 3 refills | Status: DC
  Filled 2022-12-21: qty 30, 30d supply, fill #0

## 2022-12-21 NOTE — Telephone Encounter (Signed)
Will need office visit for refills.

## 2022-12-22 ENCOUNTER — Encounter: Payer: Self-pay | Admitting: Internal Medicine

## 2022-12-22 ENCOUNTER — Other Ambulatory Visit: Payer: Self-pay

## 2022-12-22 ENCOUNTER — Ambulatory Visit: Payer: Self-pay | Attending: Internal Medicine | Admitting: Internal Medicine

## 2022-12-22 VITALS — BP 137/70 | HR 80 | Temp 98.2°F | Ht 62.0 in | Wt 143.0 lb

## 2022-12-22 DIAGNOSIS — E119 Type 2 diabetes mellitus without complications: Secondary | ICD-10-CM

## 2022-12-22 DIAGNOSIS — E039 Hypothyroidism, unspecified: Secondary | ICD-10-CM

## 2022-12-22 DIAGNOSIS — Z1211 Encounter for screening for malignant neoplasm of colon: Secondary | ICD-10-CM

## 2022-12-22 DIAGNOSIS — E785 Hyperlipidemia, unspecified: Secondary | ICD-10-CM

## 2022-12-22 DIAGNOSIS — E1169 Type 2 diabetes mellitus with other specified complication: Secondary | ICD-10-CM

## 2022-12-22 DIAGNOSIS — J069 Acute upper respiratory infection, unspecified: Secondary | ICD-10-CM

## 2022-12-22 DIAGNOSIS — I10 Essential (primary) hypertension: Secondary | ICD-10-CM

## 2022-12-22 LAB — GLUCOSE, POCT (MANUAL RESULT ENTRY): POC Glucose: 82 mg/dL (ref 70–99)

## 2022-12-22 LAB — POCT GLYCOSYLATED HEMOGLOBIN (HGB A1C): HbA1c, POC (controlled diabetic range): 7.7 % — AB (ref 0.0–7.0)

## 2022-12-22 MED ORDER — AMLODIPINE BESYLATE 10 MG PO TABS
10.0000 mg | ORAL_TABLET | Freq: Every day | ORAL | 6 refills | Status: DC
  Filled 2022-12-22 – 2023-01-25 (×2): qty 30, 30d supply, fill #0
  Filled 2023-03-23: qty 30, 30d supply, fill #1
  Filled 2023-04-25 (×2): qty 30, 30d supply, fill #2
  Filled 2023-07-12: qty 30, 30d supply, fill #3
  Filled 2023-09-05: qty 30, 30d supply, fill #4
  Filled 2023-11-20 (×2): qty 30, 30d supply, fill #5

## 2022-12-22 NOTE — Progress Notes (Signed)
Patient ID: Cynthia Wiley, female    DOB: Dec 31, 1965  MRN: [id number]  CC: Diabetes (Dm f/u. Orion Crook levothyroxine & atorvastatin meds. Jeoffrey Massed throat, cough X1 week)   Subjective: Cynthia Wiley is a 57 y.o. female who presents for chronic ds management Her concerns today include:  pt with hx of  DM type 2, HTN, HL, iron def and hypothyroid   HM:  due for colon CA screen and urine microalbumin  DM: Results for orders placed or performed in visit on 12/22/22  POCT glucose (manual entry)  Result Value Ref Range   POC Glucose 82 70 - 99 mg/dl  POCT glycosylated hemoglobin (Hb A1C)  Result Value Ref Range   Hemoglobin A1C     HbA1c POC (<> result, manual entry)     HbA1c, POC (prediabetic range)     HbA1c, POC (controlled diabetic range) 7.7 (A) 0.0 - 7.0 %  A1C improved from last visit when it was 8.1.  Still on Metformin 1 gram BID.  She declined adding any med on last visit Doing better with eating habits Does 20-30 mins of walking daily but less since weather is cold Taking Levothyroxine 50 mcg consistently LDL was 174 on labs drawn on last visit.  She was not taking the Lipitor consistently but started doing so since then.   Reports compliance with Norvasc 5 mg for BP.  Took Norvasc already for today.  Limits salt in foods.    C/o dry cough and mild hoarseness x 1 wk No SOB, fever, loss taste/smell, congestion, sore throat Not taking any OTC med.  Drinking [place] tea with honey.     Patient Active Problem List   Diagnosis Date Noted   Anemia 02/14/2019   Hyperlipidemia 08/07/2017   DM2 (diabetes mellitus, type 2) ([place]) 01/22/2015   Hypothyroidism 01/22/2015     Current Outpatient Medications on File Prior to Visit  Medication Sig Dispense Refill   amLODipine (NORVASC) 5 MG tablet Take 1 tablet (5 mg total) by mouth daily. 30 tablet 3   aspirin EC 81 MG tablet Take 1 tablet (81 mg total) by mouth daily. 100 tablet 1   atorvastatin (LIPITOR) 40 MG  tablet TAKE 1 TABLET (40 MG TOTAL) BY MOUTH DAILY. 30 tablet 6   Blood Glucose Monitoring Suppl (TRUE METRIX METER) w/Device KIT Use as directed as needed. 1 kit 0   ferrous sulfate 325 (65 FE) MG tablet Take 1 tablet (325 mg total) by mouth daily with breakfast. 100 tablet 1   glucose blood (TRUE METRIX BLOOD GLUCOSE TEST) test strip USE AS INSTRUCTED 100 strip 11   levothyroxine (SYNTHROID) 50 MCG tablet TAKE 1 & 1/2 TABLETS (75 MCG TOTAL) BY MOUTH DAILY. 45 tablet 3   metFORMIN (GLUCOPHAGE) 1000 MG tablet TAKE 1 TABLET (1,000 MG TOTAL) BY MOUTH 2 (TWO) TIMES DAILY WITH A MEAL. 60 tablet 1   TRUEplus Lancets 28G MISC Use to check blood sugar once daily. 100 each 5   No current facility-administered medications on file prior to visit.    Allergies  Allergen Reactions   Onglyza [Saxagliptin] Nausea Only   Sulfonylureas Other (See Comments)    Severe stomach ache with Amaryl and glipizide XL     Social History   Socioeconomic History   Marital status: Single    Spouse name: Not on file   Number of children: Not on file   Years of education: Not on file   Highest education level: Not on file  Occupational History   Not on file  Tobacco Use   Smoking status: Never   Smokeless tobacco: Never  Vaping Use   Vaping Use: Never used  Substance and Sexual Activity   Alcohol use: No   Drug use: No   Sexual activity: Yes    Birth control/protection: None  Other Topics Concern   Not on file  Social History Narrative   Not on file   Social Determinants of Health   Financial Resource Strain: Not on file  Food Insecurity: Not on file  Transportation Needs: Not on file  Physical Activity: Unknown (02/01/2018)   Exercise Vital Sign    Days of Exercise per Week: Patient refused    Minutes of Exercise per Session: Patient refused  Stress: Not on file  Social Connections: Unknown (02/01/2018)   Social Connection and Isolation Panel [NHANES]    Frequency of Communication with Friends  and Family: Patient refused    Frequency of Social Gatherings with Friends and Family: Patient refused    Attends Religious Services: Patient refused    Active Member of Clubs or Organizations: Patient refused    Attends Archivist Meetings: Patient refused    Marital Status: Patient refused  Intimate Partner Violence: Unknown (02/01/2018)   Humiliation, Afraid, Rape, and Kick questionnaire    Fear of Current or Ex-Partner: Patient refused    Emotionally Abused: Patient refused    Physically Abused: Patient refused    Sexually Abused: Patient refused    Family History  Problem Relation Age of Onset   Diabetes Father    Heart disease Father    Diabetes Sister    Hyperlipidemia Brother     Past Surgical History:  Procedure Laterality Date   ABDOMINAL HYSTERECTOMY N/A 01/31/2013   Procedure: HYSTERECTOMY ABDOMINAL;  Surgeon: Lavonia Drafts, MD;  Location: [facility];  Service: Gynecology;  Laterality: N/A;   BILATERAL SALPINGECTOMY Bilateral 01/31/2013   Procedure: BILATERAL SALPINGECTOMY;  Surgeon: Lavonia Drafts, MD;  Location: [facility];  Service: Gynecology;  Laterality: Bilateral;   CYSTOSCOPY WITH STENT PLACEMENT Bilateral 01/31/2013   Procedure: CYSTOSCOPY WITH STENT PLACEMENT;  Surgeon: Malka So, MD;  Location: [facility];  Service: Urology;  Laterality: Bilateral;  with c-arm    ROS: Review of Systems Negative except as stated above  PHYSICAL EXAM: BP 137/70 (BP Location: Left Arm, Patient Position: Sitting, Cuff Size: Normal)   Pulse 80   Temp 98.2 F (36.8 C) (Oral)   Ht '5\' 2"'$  (1.575 m)   Wt 143 lb (64.9 kg)   LMP 01/19/2013   SpO2 100%   BMI 26.16 kg/m   Wt Readings from Last 3 Encounters:  12/22/22 143 lb (64.9 kg)  08/19/22 145 lb 12.8 oz (66.1 kg)  04/18/22 137 lb (62.1 kg)    Physical Exam  General appearance - alert, well appearing, older female and in no distress Mental status - normal mood, behavior, speech, dress, motor activity,  and thought processes Neck - supple, no significant adenopathy Nose: Mild enlargement of nasal turbinates. Mouth: Throat is without erythema or exudates. Chest - clear to auscultation, no wheezes, rales or rhonchi, symmetric air entry Heart - normal rate, regular rhythm, normal S1, S2, no murmurs, rubs, clicks or gallops Extremities - peripheral pulses normal, no pedal edema, no clubbing or cyanosis      Latest Ref Rng & Units 12/17/2021    2:33 PM 08/13/2021    3:14 PM 05/07/2021    2:51 PM  CMP  Glucose 70 -  99 mg/dL 91  115  93   BUN 6 - 24 mg/dL '12  12  15   '$ Creatinine 0.57 - 1.00 mg/dL 0.71  0.64  0.73   Sodium 134 - 144 mmol/L 140  142  142   Potassium 3.5 - 5.2 mmol/L 4.0  4.9  5.4   Chloride 96 - 106 mmol/L 103  102  104   CO2 20 - 29 mmol/L '23  23  24   '$ Calcium 8.7 - 10.2 mg/dL 9.5  10.1  10.0   Total Protein 6.0 - 8.5 g/dL 6.9   6.9   Total Bilirubin 0.0 - 1.2 mg/dL 0.6   0.6   Alkaline Phos 44 - 121 IU/L 101   98   AST 0 - 40 IU/L 23   21   ALT 0 - 32 IU/L 18   12    Lipid Panel     Component Value Date/Time   CHOL 260 (H) 08/19/2022 1539   TRIG 124 08/19/2022 1539   HDL 64 08/19/2022 1539   CHOLHDL 4.1 08/19/2022 1539   CHOLHDL 2.7 02/07/2017 1626   VLDL 17 02/07/2017 1626   LDLCALC 174 (H) 08/19/2022 1539    CBC    Component Value Date/Time   WBC 8.3 08/19/2022 1539   WBC 7.1 01/22/2015 1047   RBC 4.40 08/19/2022 1539   RBC 4.73 01/22/2015 1047   HGB 12.1 08/19/2022 1539   HCT 36.3 08/19/2022 1539   PLT 365 08/19/2022 1539   MCV 83 08/19/2022 1539   MCH 27.5 08/19/2022 1539   MCH 26.8 01/22/2015 1047   MCHC 33.3 08/19/2022 1539   MCHC 32.0 01/22/2015 1047   RDW 13.4 08/19/2022 1539   LYMPHSABS 2.4 01/06/2009 2230   MONOABS 0.6 01/06/2009 2230   EOSABS 0.1 01/06/2009 2230   BASOSABS 0.0 01/06/2009 2230    ASSESSMENT AND PLAN: 1. Type 2 diabetes mellitus without complication, without long-term current use of insulin (HCC) A1c improved but not at  goal.  Ideally we should add another oral agent but patient does not want any additional medications.  She states she will continue to work on her eating habits.  She will continue metformin 1 g twice a day. - POCT glucose (manual entry) - POCT glycosylated hemoglobin (Hb A1C) - Microalbumin / creatinine urine ratio - Comprehensive metabolic panel - Lipid panel  2. Essential hypertension Not at goal. Increase amlodipine to 10 mg daily. - amLODipine (NORVASC) 10 MG tablet; Take 1 tablet (10 mg total) by mouth daily.  Dispense: 30 tablet; Refill: 6  3. Acquired hypothyroidism Continue levothyroxine.  Last TSH done on last visit was in range.  4. Hyperlipidemia associated with type 2 diabetes mellitus ([place]) Continue atorvastatin.  We will recheck lipid profile today.  5. Upper respiratory tract infection, unspecified type Seems to have been mild and is resolving.  No need for any medication at this time  6. Screening for colon cancer - Fecal occult blood, imunochemical([facility])    [employer] Language interpreter used during this encounter. #[id number], Effie Shy  Patient was given the opportunity to ask questions.  Patient verbalized understanding of the plan and was able to repeat key elements of the plan.   This documentation was completed using Radio producer.  Any transcriptional errors are unintentional.  Orders Placed This Encounter  Procedures   POCT glucose (manual entry)   POCT glycosylated hemoglobin (Hb A1C)     Requested Prescriptions    No prescriptions requested or ordered  in this encounter    No follow-ups on file.  Karle Plumber, MD, FACP

## 2022-12-23 ENCOUNTER — Other Ambulatory Visit: Payer: Self-pay

## 2022-12-23 LAB — MICROALBUMIN / CREATININE URINE RATIO
Creatinine, Urine: 32.2 mg/dL
Microalb/Creat Ratio: <9 mg/g creat (ref 0–29)
Microalbumin, Urine: <3 ug/mL

## 2022-12-23 LAB — COMPREHENSIVE METABOLIC PANEL
ALT: 11 IU/L (ref 0–32)
AST: 20 IU/L (ref 0–40)
Albumin/Globulin Ratio: 1.5 (ref 1.2–2.2)
Albumin: 4.4 g/dL (ref 3.8–4.9)
Alkaline Phosphatase: 102 IU/L (ref 44–121)
BUN/Creatinine Ratio: 13 (ref 9–23)
BUN: 9 mg/dL (ref 6–24)
Bilirubin Total: 0.5 mg/dL (ref 0.0–1.2)
CO2: 23 mmol/L (ref 20–29)
Calcium: 9.5 mg/dL (ref 8.7–10.2)
Chloride: 98 mmol/L (ref 96–106)
Creatinine, Ser: 0.7 mg/dL (ref 0.57–1.00)
Globulin, Total: 2.9 g/dL (ref 1.5–4.5)
Glucose: 83 mg/dL (ref 70–99)
Potassium: 3.8 mmol/L (ref 3.5–5.2)
Sodium: 138 mmol/L (ref 134–144)
Total Protein: 7.3 g/dL (ref 6.0–8.5)
eGFR: 101 mL/min/{1.73_m2} (ref >59)

## 2022-12-23 LAB — LIPID PANEL
Chol/HDL Ratio: 2.7 ratio (ref 0.0–4.4)
Cholesterol, Total: 122 mg/dL (ref 100–199)
HDL: 46 mg/dL (ref >39)
LDL Chol Calc (NIH): 59 mg/dL (ref 0–99)
Triglycerides: 91 mg/dL (ref 0–149)
VLDL Cholesterol Cal: 17 mg/dL (ref 5–40)

## 2022-12-30 ENCOUNTER — Other Ambulatory Visit: Payer: Self-pay

## 2022-12-31 LAB — FECAL OCCULT BLOOD, IMMUNOCHEMICAL: Fecal Occult Bld: NEGATIVE

## 2023-01-24 NOTE — Congregational Nurse Program (Signed)
  Dept: [phone]   Congregational Nurse Program Note  Date of Encounter: 01/24/2023  Past Medical History: Past Medical History:  Diagnosis Date   Diabetes mellitus 01/07/2008   Hyperlipidemia    Hypothyroidism    Pelvic pain in female    Thyroid disease 2010    Encounter Details:  CNP Questionnaire - 01/24/23 1647       Questionnaire   Ask client: Do you give verbal consent for me to treat you today? 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    Visit Setting with Client Organization    Patient Status Unknown    Insurance Unknown    Insurance/Financial Assistance Referral N/A    Medication N/A    Medical Provider Yes    Screening Referrals Made N/A    Medical Referrals Made [facility]    Medical Appointment Made N/A    Recently w/o PCP, now 1st time PCP visit completed due to CNs referral or appointment made N/A    Food N/A    Transportation N/A    Housing/Utilities N/A    Interpersonal Safety N/A    Interventions [place];Case Management    Abnormal to Normal Screening Since Last CN Visit N/A    Screenings CN Performed N/A    Sent Client to Lab for: N/A    Did client attend any of the following based off CNs referral or appointments made? N/A    ED Visit Averted N/A    Life-Saving Intervention Made N/A              Client has been issues with right eye, states she has a history with Diabetes type 2. Client referred to [facility], application completed and sent.

## 2023-01-25 ENCOUNTER — Other Ambulatory Visit: Payer: Self-pay | Admitting: Internal Medicine

## 2023-01-25 ENCOUNTER — Other Ambulatory Visit: Payer: Self-pay

## 2023-01-25 DIAGNOSIS — E119 Type 2 diabetes mellitus without complications: Secondary | ICD-10-CM

## 2023-01-25 MED ORDER — METFORMIN HCL 1000 MG PO TABS
1000.0000 mg | ORAL_TABLET | Freq: Two times a day (BID) | ORAL | 1 refills | Status: DC
  Filled 2023-01-25: qty 180, 90d supply, fill #0

## 2023-01-26 ENCOUNTER — Other Ambulatory Visit: Payer: Self-pay

## 2023-02-14 NOTE — Congregational Nurse Program (Signed)
  Dept: [phone]   Congregational Nurse Program Note  Date of Encounter: 02/14/2023  Past Medical History: Past Medical History:  Diagnosis Date   Diabetes mellitus 01/07/2008   Hyperlipidemia    Hypothyroidism    Pelvic pain in female    Thyroid disease 2010    Encounter Details:  CNP Questionnaire - 02/14/23 1704       Questionnaire   Ask client: Do you give verbal consent for me to treat you today? 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    Visit Setting with Client Organization    Patient Status Unknown    Insurance Uninsured ([employer]/Self-Pay/Medicaid Family Planning)    Insurance/Financial Assistance Referral N/A    Medication N/A    Medical Provider Yes    Screening Referrals Made N/A    Medical Referrals Made [facility]    Medical Appointment Made [facility]    Recently w/o PCP, now 1st time PCP visit completed due to CNs referral or appointment made N/A    Food N/A    Transportation N/A    Housing/Utilities N/A    Interpersonal Safety N/A    Interventions [place];Case Management    Abnormal to Normal Screening Since Last CN Visit N/A    Screenings CN Performed N/A    Sent Client to Lab for: N/A    Did client attend any of the following based off CNs referral or appointments made? N/A    ED Visit Averted N/A    Life-Saving Intervention Made N/A            Client received her [employer] vision voucher. Set up for eye appointment on 03/15/23 at 3:30PM. She will find transportation and an interpreter to take to her appointment. Also needed help with routes, [email or web address] link sent to client.

## 2023-03-23 ENCOUNTER — Other Ambulatory Visit: Payer: Self-pay | Admitting: Internal Medicine

## 2023-03-23 ENCOUNTER — Other Ambulatory Visit: Payer: Self-pay

## 2023-03-23 DIAGNOSIS — E785 Hyperlipidemia, unspecified: Secondary | ICD-10-CM

## 2023-03-23 MED ORDER — ATORVASTATIN CALCIUM 40 MG PO TABS
40.0000 mg | ORAL_TABLET | Freq: Every day | ORAL | 0 refills | Status: DC
  Filled 2023-03-23: qty 90, 90d supply, fill #0

## 2023-04-24 ENCOUNTER — Ambulatory Visit: Payer: Self-pay | Attending: Internal Medicine | Admitting: Internal Medicine

## 2023-04-24 ENCOUNTER — Encounter: Payer: Self-pay | Admitting: Internal Medicine

## 2023-04-24 ENCOUNTER — Other Ambulatory Visit: Payer: Self-pay

## 2023-04-24 VITALS — BP 134/72 | HR 75 | Temp 99.1°F | Resp 16 | Ht 62.0 in | Wt 143.0 lb

## 2023-04-24 DIAGNOSIS — I152 Hypertension secondary to endocrine disorders: Secondary | ICD-10-CM

## 2023-04-24 DIAGNOSIS — E1169 Type 2 diabetes mellitus with other specified complication: Secondary | ICD-10-CM

## 2023-04-24 DIAGNOSIS — E039 Hypothyroidism, unspecified: Secondary | ICD-10-CM

## 2023-04-24 DIAGNOSIS — E785 Hyperlipidemia, unspecified: Secondary | ICD-10-CM

## 2023-04-24 DIAGNOSIS — Z7984 Long term (current) use of oral hypoglycemic drugs: Secondary | ICD-10-CM

## 2023-04-24 DIAGNOSIS — E1159 Type 2 diabetes mellitus with other circulatory complications: Secondary | ICD-10-CM

## 2023-04-24 LAB — POCT GLYCOSYLATED HEMOGLOBIN (HGB A1C): HbA1c, POC (controlled diabetic range): 7.2 % — AB (ref 0.0–7.0)

## 2023-04-24 LAB — GLUCOSE, POCT (MANUAL RESULT ENTRY): POC Glucose: 90 mg/dl (ref 70–99)

## 2023-04-24 MED ORDER — METFORMIN HCL 1000 MG PO TABS
1000.0000 mg | ORAL_TABLET | Freq: Two times a day (BID) | ORAL | 1 refills | Status: DC
  Filled 2023-04-24: qty 180, 90d supply, fill #0
  Filled 2023-07-12: qty 180, 90d supply, fill #1

## 2023-04-24 MED ORDER — ATORVASTATIN CALCIUM 40 MG PO TABS
40.0000 mg | ORAL_TABLET | Freq: Every day | ORAL | 1 refills | Status: DC
  Filled 2023-04-24 – 2023-07-12 (×3): qty 90, 90d supply, fill #0
  Filled 2023-10-03 (×2): qty 90, 90d supply, fill #1

## 2023-04-24 NOTE — Progress Notes (Signed)
Cynthia Wiley [id number]  A1C - 7.2  CBG-90

## 2023-04-24 NOTE — Progress Notes (Signed)
Patient ID: Cynthia Wiley, female    DOB: [birth year]  MRN: [id number]  CC: Chronic disease management.  Subjective: Cynthia Wiley is a 57 y.o. female who presents for chronic ds management Her concerns today include:  pt with hx of  DM type 2, HTN, HL, iron def and hypothyroid   AM[employer] Language interpreter used during this encounter. #Ada [id number]  HL:  taking and tolerating Lipitor 40 mg daily Last LDL 4 mths ago was 59  HTN:  Norvasc increased to 10 mg on last visit.   Reports compliance with med and low salt diet.  No chest pain, shortness of breath or lower extremity edema.  DM: Results for orders placed or performed in visit on 04/24/23  HgB A1c  Result Value Ref Range   Hemoglobin A1C     HbA1c POC (<> result, manual entry)     HbA1c, POC (prediabetic range)     HbA1c, POC (controlled diabetic range) 7.2 (A) 0.0 - 7.0 %  Glucose (CBG)  Result Value Ref Range   POC Glucose 90 70 - 99 mg/dl  B1Y slightly improved from 7.7 on last visit. Taking Metformin 1 gram BID as prescribed Doing better with eating habits Walk 40 mins daily Over due for DM eye exam.  She plans to schedule at [facility].    Taking Levothyroxine daily as prescribed.  Due for TSH check.Marland Kitchen  HM:  due for shingles vaccine.  Patient declined.  Patient Active Problem List   Diagnosis Date Noted   Anemia 02/14/2019   Hyperlipidemia 08/07/2017   DM2 (diabetes mellitus, type 2) (HCC) 01/22/2015   Hypothyroidism 01/22/2015     Current Outpatient Medications on File Prior to Visit  Medication Sig Dispense Refill   amLODipine (NORVASC) 10 MG tablet Take 1 tablet (10 mg total) by mouth daily. 30 tablet 6   aspirin EC 81 MG tablet Take 1 tablet (81 mg total) by mouth daily. 100 tablet 1   atorvastatin (LIPITOR) 40 MG tablet Take 1 tablet (40 mg total) by mouth daily. 90 tablet 0   Blood Glucose Monitoring Suppl (TRUE METRIX METER) w/Device KIT Use as directed as needed. 1 kit 0    ferrous sulfate 325 (65 FE) MG tablet Take 1 tablet (325 mg total) by mouth daily with breakfast. 100 tablet 1   levothyroxine (SYNTHROID) 50 MCG tablet TAKE 1 & 1/2 TABLETS (75 MCG TOTAL) BY MOUTH DAILY. 45 tablet 3   metFORMIN (GLUCOPHAGE) 1000 MG tablet Take 1 tablet (1,000 mg total) by mouth 2 (two) times daily with a meal. 180 tablet 1   TRUEplus Lancets 28G MISC Use to check blood sugar once daily. 100 each 5   No current facility-administered medications on file prior to visit.    Allergies  Allergen Reactions   Onglyza [Saxagliptin] Nausea Only   Sulfonylureas Other (See Comments)    Severe stomach ache with Amaryl and glipizide XL     Social History   Socioeconomic History   Marital status: Single    Spouse name: Not on file   Number of children: Not on file   Years of education: Not on file   Highest education level: Not on file  Occupational History   Not on file  Tobacco Use   Smoking status: Never   Smokeless tobacco: Never  Vaping Use   Vaping Use: Never used  Substance and Sexual Activity   Alcohol use: No   Drug use: No   Sexual  activity: Yes    Birth control/protection: None  Other Topics Concern   Not on file  Social History Narrative   Not on file   Social Determinants of Health   Financial Resource Strain: Not on file  Food Insecurity: Not on file  Transportation Needs: Not on file  Physical Activity: Unknown (02/01/2018)   Exercise Vital Sign    Days of Exercise per Week: Patient declined    Minutes of Exercise per Session: Patient declined  Stress: Not on file  Social Connections: Unknown (02/01/2018)   Social Connection and Isolation Panel [NHANES]    Frequency of Communication with Friends and Family: Patient declined    Frequency of Social Gatherings with Friends and Family: Patient declined    Attends Religious Services: Patient declined    Active Member of Clubs or Organizations: Patient declined    Attends Banker  Meetings: Patient declined    Marital Status: Patient declined  Intimate Partner Violence: Unknown (02/01/2018)   Humiliation, Afraid, Rape, and Kick questionnaire    Fear of Current or Ex-Partner: Patient declined    Emotionally Abused: Patient declined    Physically Abused: Patient declined    Sexually Abused: Patient declined    Family History  Problem Relation Age of Onset   Diabetes Father    Heart disease Father    Diabetes Sister    Hyperlipidemia Brother     Past Surgical History:  Procedure Laterality Date   ABDOMINAL HYSTERECTOMY N/A 01/31/2013   Procedure: HYSTERECTOMY ABDOMINAL;  Surgeon: Willodean Rosenthal, MD;  Location: [facility];  Service: Gynecology;  Laterality: N/A;   BILATERAL SALPINGECTOMY Bilateral 01/31/2013   Procedure: BILATERAL SALPINGECTOMY;  Surgeon: Willodean Rosenthal, MD;  Location: [facility];  Service: Gynecology;  Laterality: Bilateral;   CYSTOSCOPY WITH STENT PLACEMENT Bilateral 01/31/2013   Procedure: CYSTOSCOPY WITH STENT PLACEMENT;  Surgeon: Anner Crete, MD;  Location: [facility];  Service: Urology;  Laterality: Bilateral;  with c-arm    ROS: Review of Systems Negative except as stated above  PHYSICAL EXAM: BP 134/72 (BP Location: Left Arm, Patient Position: Sitting, Cuff Size: Normal)   Pulse 75   Temp 99.1 F (37.3 C) (Oral)   Resp 16   Ht 5\' 2"  (1.575 m)   Wt 143 lb (64.9 kg)   LMP 01/19/2013   SpO2 100%   BMI 26.16 kg/m   Physical Exam 134/70 General appearance - alert, well appearing, and in no distress Mental status - normal mood, behavior, speech, dress, motor activity, and thought processes Neck - supple, no significant adenopathy Chest - clear to auscultation, no wheezes, rales or rhonchi, symmetric air entry Heart - normal rate, regular rhythm, normal S1, S2, no murmurs, rubs, clicks or gallops Extremities - peripheral pulses normal, no pedal edema, no clubbing or cyanosis      Latest Ref Rng & Units 12/22/2022    4:46 PM  12/17/2021    2:33 PM 08/13/2021    3:14 PM  CMP  Glucose 70 - 99 mg/dL 83  91  409   BUN 6 - 24 mg/dL 9  12  12    Creatinine 0.57 - 1.00 mg/dL 8.11  9.14  7.82   Sodium 134 - 144 mmol/L 138  140  142   Potassium 3.5 - 5.2 mmol/L 3.8  4.0  4.9   Chloride 96 - 106 mmol/L 98  103  102   CO2 20 - 29 mmol/L 23  23  23    Calcium 8.7 - 10.2 mg/dL  9.5  9.5  10.1   Total Protein 6.0 - 8.5 g/dL 7.3  6.9    Total Bilirubin 0.0 - 1.2 mg/dL 0.5  0.6    Alkaline Phos 44 - 121 IU/L 102  101    AST 0 - 40 IU/L 20  23    ALT 0 - 32 IU/L 11  18     Lipid Panel     Component Value Date/Time   CHOL 122 12/22/2022 1646   TRIG 91 12/22/2022 1646   HDL 46 12/22/2022 1646   CHOLHDL 2.7 12/22/2022 1646   CHOLHDL 2.7 02/07/2017 1626   VLDL 17 02/07/2017 1626   LDLCALC 59 12/22/2022 1646    CBC    Component Value Date/Time   WBC 8.3 08/19/2022 1539   WBC 7.1 01/22/2015 1047   RBC 4.40 08/19/2022 1539   RBC 4.73 01/22/2015 1047   HGB 12.1 08/19/2022 1539   HCT 36.3 08/19/2022 1539   PLT 365 08/19/2022 1539   MCV 83 08/19/2022 1539   MCH 27.5 08/19/2022 1539   MCH 26.8 01/22/2015 1047   MCHC 33.3 08/19/2022 1539   MCHC 32.0 01/22/2015 1047   RDW 13.4 08/19/2022 1539   LYMPHSABS 2.4 01/06/2009 2230   MONOABS 0.6 01/06/2009 2230   EOSABS 0.1 01/06/2009 2230   BASOSABS 0.0 01/06/2009 2230    ASSESSMENT AND PLAN:  1. Type 2 diabetes mellitus with hyperlipidemia (HCC) Close to goal.  Continue metformin 1 g twice a day.  Encouraged her to continue healthy eating habits and regular exercise. - HgB A1c - Glucose (CBG) - atorvastatin (LIPITOR) 40 MG tablet; Take 1 tablet (40 mg total) by mouth daily.  Dispense: 90 tablet; Refill: 1  2. Hypertension associated with type 2 diabetes mellitus (HCC) Close to goal.  Continue Norvasc 10 mg daily.  3. Acquired hypothyroidism Continue levothyroxine 50 mcg daily. - TSH   Patient was given the opportunity to ask questions.  Patient verbalized  understanding of the plan and was able to repeat key elements of the plan.   This documentation was completed using Paediatric nurse.  Any transcriptional errors are unintentional.  Orders Placed This Encounter  Procedures   HgB A1c   Glucose (CBG)     Requested Prescriptions    No prescriptions requested or ordered in this encounter    No follow-ups on file.  Jonah Blue, MD, FACP

## 2023-04-25 ENCOUNTER — Other Ambulatory Visit: Payer: Self-pay | Admitting: Internal Medicine

## 2023-04-25 ENCOUNTER — Other Ambulatory Visit: Payer: Self-pay

## 2023-04-25 DIAGNOSIS — E119 Type 2 diabetes mellitus without complications: Secondary | ICD-10-CM

## 2023-04-25 MED ORDER — TRUE METRIX BLOOD GLUCOSE TEST VI STRP
ORAL_STRIP | 2 refills | Status: DC
  Filled 2023-04-25: qty 100, 90d supply, fill #0
  Filled 2023-10-03 (×2): qty 100, 90d supply, fill #1
  Filled 2024-02-06: qty 100, 90d supply, fill #2

## 2023-04-25 MED ORDER — TRUEPLUS LANCETS 28G MISC
2 refills | Status: DC
  Filled 2023-04-25: qty 100, 90d supply, fill #0
  Filled 2023-07-12: qty 100, 90d supply, fill #1
  Filled 2023-10-03 (×2): qty 100, 90d supply, fill #2

## 2023-04-26 ENCOUNTER — Other Ambulatory Visit: Payer: Self-pay

## 2023-04-26 LAB — TSH: TSH: 2.35 u[IU]/mL (ref 0.450–4.500)

## 2023-05-08 ENCOUNTER — Ambulatory Visit: Payer: Self-pay | Admitting: *Deleted

## 2023-05-08 NOTE — Telephone Encounter (Signed)
Reason for Disposition  [1] Chest pain(s) lasting a few seconds AND [2] persists > 3 days    Right upper chest and shoulder hurting after hitting right shoulder on a table last Wed.  Answer Assessment - Initial Assessment Questions 1. LOCATION: "Where does it hurt?"       Called in with Spanish interpreter Eddie Candle [phone]  On right side near my neck I'm having pain.    Near my collar bone.  When I lay down it's sharp but up walking it's not so bad. Wed. I fell but not to the ground.   My right shoulder hit on a table.   It didn't hurt at the time but now it's bothering me.    I can lift my arm but I feel pressure at my collar bone.   Doesn't look swollen.    2. RADIATION: "Does the pain go anywhere else?" (e.g., into neck, jaw, arms, back)     No 3. ONSET: "When did the chest pain begin?" (Minutes, hours or days)      Almost fell on Wed. But pain started Thur.   When I breath it hurts.   4. PATTERN: "Does the pain come and go, or has it been constant since it started?"  "Does it get worse with exertion?"      When I breath it hurts. 5. DURATION: "How long does it last" (e.g., seconds, minutes, hours)     Hurts when I breath.     And pain in shoulder area and upper right chest. 6. SEVERITY: "How bad is the pain?"  (e.g., Scale 1-10; mild, moderate, or severe)    - MILD (1-3): doesn't interfere with normal activities     - MODERATE (4-7): interferes with normal activities or awakens from sleep    - SEVERE (8-10): excruciating pain, unable to do any normal activities       Not asked 7. CARDIAC RISK FACTORS: "Do you have any history of heart problems or risk factors for heart disease?" (e.g., angina, prior heart attack; diabetes, high blood pressure, high cholesterol, smoker, or strong family history of heart disease)     Not asked 8. PULMONARY RISK FACTORS: "Do you have any history of lung disease?"  (e.g., blood clots in lung, asthma, emphysema, birth control pills)     Not asked 9. CAUSE:  "What do you think is causing the chest pain?"     I hit my shoulder on a table when I almost fell on Wed. 10. OTHER SYMPTOMS: "Do you have any other symptoms?" (e.g., dizziness, nausea, vomiting, sweating, fever, difficulty breathing, cough)       No 11. PREGNANCY: "Is there any chance you are pregnant?" "When was your last menstrual period?"       Not asked due to age  Protocols used: Chest Pain-A-AH

## 2023-05-08 NOTE — Telephone Encounter (Signed)
  Chief Complaint: right upper chest and shoulder pain Symptoms: Pain in right upper chest/neck area and around collar bone/shoulder area after almost falling last Wed. And hitting it on a table.   Hurts with a deep breath. Frequency: Pain started Thur. Pertinent Negatives: Patient denies having limited movement of arm or swelling.  Disposition: [] ED /[] Urgent Care (no appt availability in office) / [x] Appointment(In office/virtual)/ []  [facility] [facility]/ [] Home Care/ [] Refused Recommended Disposition /[] [facility]/ []  Follow-up with PCP Additional Notes: Appt. Made with Dr. Shan Levans for 05/09/2023 at 9:10. Instructed to go to the ED if shortness of breath, limited arm movement or the pain becomes worse between now and time of her appt.    Pt. Was agreeable to this plan.

## 2023-05-08 NOTE — Progress Notes (Unsigned)
   Acute Office Visit  Subjective:     Patient ID: Cynthia Wiley, female    DOB: Nov 16, 1966, 57 y.o.   MRN: [id number]  No chief complaint on file.   HPI Patient is in today for ***  ROS      Objective:    LMP 01/19/2013  {Vitals History (Optional):23777}  Physical Exam  No results found for any visits on 05/09/23.      Assessment & Plan:   Problem List Items Addressed This Visit   None   No orders of the defined types were placed in this encounter.   No follow-ups on file.  Shan Levans, MD

## 2023-05-09 ENCOUNTER — Encounter: Payer: Self-pay | Admitting: Critical Care Medicine

## 2023-05-09 ENCOUNTER — Other Ambulatory Visit: Payer: Self-pay

## 2023-05-09 ENCOUNTER — Ambulatory Visit: Payer: Self-pay | Attending: Critical Care Medicine | Admitting: Critical Care Medicine

## 2023-05-09 VITALS — BP 149/74 | HR 69 | Wt 138.8 lb

## 2023-05-09 DIAGNOSIS — M25511 Pain in right shoulder: Secondary | ICD-10-CM

## 2023-05-09 DIAGNOSIS — M25519 Pain in unspecified shoulder: Secondary | ICD-10-CM | POA: Insufficient documentation

## 2023-05-09 MED ORDER — MELOXICAM 7.5 MG PO TABS
7.5000 mg | ORAL_TABLET | Freq: Every day | ORAL | 0 refills | Status: DC
  Filled 2023-05-09: qty 30, 30d supply, fill #0

## 2023-05-09 MED ORDER — DICLOFENAC SODIUM 1 % EX GEL
2.0000 g | Freq: Four times a day (QID) | CUTANEOUS | 0 refills | Status: AC
  Filled 2023-05-09: qty 100, 25d supply, fill #0

## 2023-05-09 NOTE — Assessment & Plan Note (Signed)
Status post fall with inflammation and bruising at the sternoclavicular joint  Patient given oral and topical nonsteroidals and told to ice this area and return in 3 weeks if unimproved patient was given a work note

## 2023-05-09 NOTE — Patient Instructions (Addendum)
Take Voltaren gel applied to affected area on chest 4 times daily and take meloxicam 1 daily both for pain in the chest where you have injured your shoulder for 5 days then stop  Apply ice twice a day to the area of injury for about 10 to 15 minutes for 5 days then stop  Letter for work given stay out of work 2 weeks  If unimproved call us back for return visit  Tome el gel Voltaren aplicado en el rea afectada del pecho 4 veces al da y tome meloxicam 1 vez al da para Chief Technology Officer en el pecho donde se lesion el hombro durante 5 das y luego deje de [address].  Aplique hielo dos veces al da en el rea de la lesin durante aproximadamente 10 a 15 minutos durante 5 das y luego deje de [employer].  Carta de trabajo dado permanecer sin trabajar 2 semanas  Si no mejora, llmenos para una nueva visita.

## 2023-07-12 ENCOUNTER — Other Ambulatory Visit: Payer: Self-pay

## 2023-07-12 ENCOUNTER — Other Ambulatory Visit: Payer: Self-pay | Admitting: Internal Medicine

## 2023-07-12 DIAGNOSIS — E039 Hypothyroidism, unspecified: Secondary | ICD-10-CM

## 2023-07-12 MED ORDER — LEVOTHYROXINE SODIUM 50 MCG PO TABS
75.0000 ug | ORAL_TABLET | Freq: Every day | ORAL | 2 refills | Status: DC
  Filled 2023-07-12: qty 45, 30d supply, fill #0
  Filled 2023-09-05: qty 45, 30d supply, fill #1
  Filled 2023-11-20 (×2): qty 45, 30d supply, fill #2

## 2023-09-05 ENCOUNTER — Encounter: Payer: Self-pay | Admitting: Internal Medicine

## 2023-09-05 ENCOUNTER — Other Ambulatory Visit: Payer: Self-pay

## 2023-09-05 ENCOUNTER — Ambulatory Visit: Payer: Self-pay | Attending: Internal Medicine | Admitting: Internal Medicine

## 2023-09-05 VITALS — BP 134/75 | HR 69 | Temp 98.2°F | Ht 62.0 in | Wt 146.0 lb

## 2023-09-05 DIAGNOSIS — I152 Hypertension secondary to endocrine disorders: Secondary | ICD-10-CM

## 2023-09-05 DIAGNOSIS — E1159 Type 2 diabetes mellitus with other circulatory complications: Secondary | ICD-10-CM

## 2023-09-05 DIAGNOSIS — E785 Hyperlipidemia, unspecified: Secondary | ICD-10-CM

## 2023-09-05 DIAGNOSIS — E611 Iron deficiency: Secondary | ICD-10-CM

## 2023-09-05 DIAGNOSIS — E1169 Type 2 diabetes mellitus with other specified complication: Secondary | ICD-10-CM

## 2023-09-05 DIAGNOSIS — E039 Hypothyroidism, unspecified: Secondary | ICD-10-CM

## 2023-09-05 DIAGNOSIS — Z7984 Long term (current) use of oral hypoglycemic drugs: Secondary | ICD-10-CM

## 2023-09-05 DIAGNOSIS — Z23 Encounter for immunization: Secondary | ICD-10-CM

## 2023-09-05 LAB — POCT GLYCOSYLATED HEMOGLOBIN (HGB A1C): HbA1c, POC (controlled diabetic range): 8.1 % — AB (ref 0.0–7.0)

## 2023-09-05 LAB — GLUCOSE, POCT (MANUAL RESULT ENTRY): POC Glucose: 89 mg/dL (ref 70–99)

## 2023-09-05 MED ORDER — METFORMIN HCL 1000 MG PO TABS
1000.0000 mg | ORAL_TABLET | Freq: Two times a day (BID) | ORAL | 1 refills | Status: DC
  Filled 2023-09-05 – 2023-10-03 (×2): qty 180, 90d supply, fill #0
  Filled 2023-11-20 – 2024-02-06 (×2): qty 180, 90d supply, fill #1

## 2023-09-05 NOTE — Progress Notes (Signed)
Patient ID: Cynthia Wiley, female    DOB: October 10, 1966  MRN: [id number]  CC: Diabetes (DM f/u. Med refill. /No questions/ concerns/Yes to flu vax)   Subjective: Cynthia Wiley is a 57 y.o. female who presents for chronic ds management. Her concerns today include:  pt with hx of  DM type 2, HTN, HL, iron def and hypothyroid    [employer] Language interpreter used during this encounter. #Antonio [id number]  DM: Results for orders placed or performed in visit on 09/05/23  POCT glycosylated hemoglobin (Hb A1C)  Result Value Ref Range   Hemoglobin A1C     HbA1c POC (<> result, manual entry)     HbA1c, POC (prediabetic range)     HbA1c, POC (controlled diabetic range) 8.1 (A) 0.0 - 7.0 %  POCT glucose (manual entry)  Result Value Ref Range   POC Glucose 89 70 - 99 mg/dl  M3N has increased.  It was 7.2 on last visit. Reports compliance with Metformin 1 gram BID.  Checks BS daily before BF.  Range given: 130-135. Reports she has been eating things she should be  Walks 40-60 mins daily.  HL:  taking and tolerating Lipitor 40 mg daily Last LDL 4 mths ago was 59   HTN:  On Norvasc 10 mg.   Reports compliance with med and low salt diet.  Does not check BP. No chest pain, shortness of breath or lower extremity edema.  Thyroid:  compliant with Levothyroxine.  TSH in May was 2.3  Not taking iron consistently any more.  No fatigue or dizziness.  Patient Active Problem List   Diagnosis Date Noted   Pain in clavicular joint 05/09/2023   Anemia 02/14/2019   Hyperlipidemia 08/07/2017   DM2 (diabetes mellitus, type 2) (HCC) 01/22/2015   Hypothyroidism 01/22/2015     Current Outpatient Medications on File Prior to Visit  Medication Sig Dispense Refill   amLODipine (NORVASC) 10 MG tablet Take 1 tablet (10 mg total) by mouth daily. 30 tablet 6   aspirin EC 81 MG tablet Take 1 tablet (81 mg total) by mouth daily. 100 tablet 1   atorvastatin (LIPITOR) 40 MG tablet Take 1 tablet (40  mg total) by mouth daily. 90 tablet 1   Blood Glucose Monitoring Suppl (TRUE METRIX METER) w/Device KIT Use as directed as needed. 1 kit 0   diclofenac Sodium (VOLTAREN) 1 % GEL Apply 2 g topically 4 (four) times daily. To affected area 100 g 0   glucose blood (TRUE METRIX BLOOD GLUCOSE TEST) test strip USE AS INSTRUCTED 100 strip 2   levothyroxine (SYNTHROID) 50 MCG tablet Take 1.5 tablets (75 mcg total) by mouth daily. 45 tablet 2   meloxicam (MOBIC) 7.5 MG tablet Take 1 tablet (7.5 mg total) by mouth daily. 30 tablet 0   TRUEplus Lancets 28G MISC Use to check blood sugar once daily. 100 each 2   ferrous sulfate 325 (65 FE) MG tablet Take 1 tablet (325 mg total) by mouth daily with breakfast. 100 tablet 1   No current facility-administered medications on file prior to visit.    Allergies  Allergen Reactions   Onglyza [Saxagliptin] Nausea Only   Sulfonylureas Other (See Comments)    Severe stomach ache with Amaryl and glipizide XL     Social History   Socioeconomic History   Marital status: Single    Spouse name: Not on file   Number of children: Not on file   Years of education: Not on  file   Highest education level: Not on file  Occupational History   Not on file  Tobacco Use   Smoking status: Never   Smokeless tobacco: Never  Vaping Use   Vaping status: Never Used  Substance and Sexual Activity   Alcohol use: No   Drug use: No   Sexual activity: Yes    Birth control/protection: None  Other Topics Concern   Not on file  Social History Narrative   Not on file   Social Determinants of Health   Financial Resource Strain: Not on file  Food Insecurity: Not on file  Transportation Needs: Not on file  Physical Activity: Unknown (02/01/2018)   Exercise Vital Sign    Days of Exercise per Week: Patient declined    Minutes of Exercise per Session: Patient declined  Stress: Not on file  Social Connections: Unknown (02/01/2018)   Social Connection and Isolation Panel  [NHANES]    Frequency of Communication with Friends and Family: Patient declined    Frequency of Social Gatherings with Friends and Family: Patient declined    Attends Religious Services: Patient declined    Active Member of Clubs or Organizations: Patient declined    Attends Banker Meetings: Patient declined    Marital Status: Patient declined  Intimate Partner Violence: Unknown (02/01/2018)   Humiliation, Afraid, Rape, and Kick questionnaire    Fear of Current or Ex-Partner: Patient declined    Emotionally Abused: Patient declined    Physically Abused: Patient declined    Sexually Abused: Patient declined    Family History  Problem Relation Age of Onset   Diabetes Father    Heart disease Father    Diabetes Sister    Hyperlipidemia Brother     Past Surgical History:  Procedure Laterality Date   ABDOMINAL HYSTERECTOMY N/A 01/31/2013   Procedure: HYSTERECTOMY ABDOMINAL;  Surgeon: Willodean Rosenthal, MD;  Location: [facility];  Service: Gynecology;  Laterality: N/A;   BILATERAL SALPINGECTOMY Bilateral 01/31/2013   Procedure: BILATERAL SALPINGECTOMY;  Surgeon: Willodean Rosenthal, MD;  Location: [facility];  Service: Gynecology;  Laterality: Bilateral;   CYSTOSCOPY WITH STENT PLACEMENT Bilateral 01/31/2013   Procedure: CYSTOSCOPY WITH STENT PLACEMENT;  Surgeon: Anner Crete, MD;  Location: [facility];  Service: Urology;  Laterality: Bilateral;  with c-arm    ROS: Review of Systems Negative except as stated above  PHYSICAL EXAM: BP 134/75   Pulse 69   Temp 98.2 F (36.8 C) (Oral)   Ht 5\' 2"  (1.575 m)   Wt 146 lb (66.2 kg)   LMP 01/19/2013   SpO2 100%   BMI 26.70 kg/m   Wt Readings from Last 3 Encounters:  09/05/23 146 lb (66.2 kg)  05/09/23 138 lb 12.8 oz (63 kg)  04/24/23 143 lb (64.9 kg)    Physical Exam  General appearance - alert, well appearing, and in no distress Mental status - normal mood, behavior, speech, dress, motor activity, and thought  processes Eye:  pale conjunctiva Neck - supple, no significant adenopathy Chest - clear to auscultation, no wheezes, rales or rhonchi, symmetric air entry Heart - normal rate, regular rhythm, normal S1, S2, no murmurs, rubs, clicks or gallops Extremities - peripheral pulses normal, no pedal edema, no clubbing or cyanosis      Latest Ref Rng & Units 12/22/2022    4:46 PM 12/17/2021    2:33 PM 08/13/2021    3:14 PM  CMP  Glucose 70 - 99 mg/dL 83  91  161   BUN  6 - 24 mg/dL 9  12  12    Creatinine 0.57 - 1.00 mg/dL 1.61  0.96  0.45   Sodium 134 - 144 mmol/L 138  140  142   Potassium 3.5 - 5.2 mmol/L 3.8  4.0  4.9   Chloride 96 - 106 mmol/L 98  103  102   CO2 20 - 29 mmol/L 23  23  23    Calcium 8.7 - 10.2 mg/dL 9.5  9.5  40.9   Total Protein 6.0 - 8.5 g/dL 7.3  6.9    Total Bilirubin 0.0 - 1.2 mg/dL 0.5  0.6    Alkaline Phos 44 - 121 IU/L 102  101    AST 0 - 40 IU/L 20  23    ALT 0 - 32 IU/L 11  18     Lipid Panel     Component Value Date/Time   CHOL 122 12/22/2022 1646   TRIG 91 12/22/2022 1646   HDL 46 12/22/2022 1646   CHOLHDL 2.7 12/22/2022 1646   CHOLHDL 2.7 02/07/2017 1626   VLDL 17 02/07/2017 1626   LDLCALC 59 12/22/2022 1646    CBC    Component Value Date/Time   WBC 8.3 08/19/2022 1539   WBC 7.1 01/22/2015 1047   RBC 4.40 08/19/2022 1539   RBC 4.73 01/22/2015 1047   HGB 12.1 08/19/2022 1539   HCT 36.3 08/19/2022 1539   PLT 365 08/19/2022 1539   MCV 83 08/19/2022 1539   MCH 27.5 08/19/2022 1539   MCH 26.8 01/22/2015 1047   MCHC 33.3 08/19/2022 1539   MCHC 32.0 01/22/2015 1047   RDW 13.4 08/19/2022 1539   LYMPHSABS 2.4 01/06/2009 2230   MONOABS 0.6 01/06/2009 2230   EOSABS 0.1 01/06/2009 2230   BASOSABS 0.0 01/06/2009 2230    ASSESSMENT AND PLAN: 1. Type 2 diabetes mellitus with hyperlipidemia (HCC) A1c is not at goal.  We discussed adding another medication to metformin versus having her work on improving her eating habits.  Patient prefers to work on her  eating habits first.  If still elevated on next visit, we can have the discussion again about adding another medication. - POCT glycosylated hemoglobin (Hb A1C) - POCT glucose (manual entry) - metFORMIN (GLUCOPHAGE) 1000 MG tablet; Take 1 tablet (1,000 mg total) by mouth 2 (two) times daily with a meal.  Dispense: 180 tablet; Refill: 1  2. Hypertension associated with type 2 diabetes mellitus (HCC) Repeat blood pressure closer to goal.  Continue Norvasc 10 mg daily.  3. Acquired hypothyroidism Continue levothyroxine.  4. Iron deficiency - CBC  5. Encounter for immunization - Flu vaccine trivalent PF, 6mos and older(Flulaval,Afluria,Fluarix,Fluzone)                Patient was given the opportunity to ask questions.  Patient verbalized understanding of the plan and was able to repeat key elements of the plan.   This documentation was completed using Paediatric nurse.  Any transcriptional errors are unintentional.  Orders Placed This Encounter  Procedures   Flu vaccine trivalent PF, 6mos and older(Flulaval,Afluria,Fluarix,Fluzone)   CBC   POCT glycosylated hemoglobin (Hb A1C)   POCT glucose (manual entry)     Requested Prescriptions   Signed Prescriptions Disp Refills   metFORMIN (GLUCOPHAGE) 1000 MG tablet 180 tablet 1    Sig: Take 1 tablet (1,000 mg total) by mouth 2 (two) times daily with a meal.    No follow-ups on file.  Jonah Blue, MD, FACP

## 2023-09-06 ENCOUNTER — Other Ambulatory Visit: Payer: Self-pay | Admitting: Internal Medicine

## 2023-09-06 ENCOUNTER — Other Ambulatory Visit: Payer: Self-pay

## 2023-09-06 DIAGNOSIS — E611 Iron deficiency: Secondary | ICD-10-CM

## 2023-09-06 LAB — CBC
Hematocrit: 28.6 % — ABNORMAL LOW (ref 34.0–46.6)
Hemoglobin: 8.2 g/dL — ABNORMAL LOW (ref 11.1–15.9)
MCH: 21 pg — ABNORMAL LOW (ref 26.6–33.0)
MCHC: 28.7 g/dL — ABNORMAL LOW (ref 31.5–35.7)
MCV: 73 fL — ABNORMAL LOW (ref 79–97)
Platelets: 519 10*3/uL — ABNORMAL HIGH (ref 150–450)
RBC: 3.91 x10E6/uL (ref 3.77–5.28)
RDW: 15.6 % — ABNORMAL HIGH (ref 11.7–15.4)
WBC: 9.2 10*3/uL (ref 3.4–10.8)

## 2023-09-06 MED ORDER — FERROUS SULFATE 325 (65 FE) MG PO TABS
325.0000 mg | ORAL_TABLET | Freq: Every day | ORAL | 2 refills | Status: DC
  Filled 2023-09-06: qty 100, 100d supply, fill #0
  Filled 2024-02-26: qty 100, 100d supply, fill #1
  Filled 2024-04-30 – 2024-06-03 (×2): qty 100, 100d supply, fill #2

## 2023-10-03 ENCOUNTER — Other Ambulatory Visit: Payer: Self-pay

## 2023-10-04 ENCOUNTER — Other Ambulatory Visit: Payer: Self-pay

## 2023-11-20 ENCOUNTER — Other Ambulatory Visit: Payer: Self-pay | Admitting: Internal Medicine

## 2023-11-20 ENCOUNTER — Other Ambulatory Visit: Payer: Self-pay

## 2023-11-20 DIAGNOSIS — E1169 Type 2 diabetes mellitus with other specified complication: Secondary | ICD-10-CM

## 2023-11-21 ENCOUNTER — Other Ambulatory Visit: Payer: Self-pay

## 2023-11-21 MED ORDER — ATORVASTATIN CALCIUM 40 MG PO TABS
40.0000 mg | ORAL_TABLET | Freq: Every day | ORAL | 1 refills | Status: DC
  Filled 2023-11-21 – 2024-02-06 (×2): qty 90, 90d supply, fill #0
  Filled 2024-04-30 (×2): qty 90, 90d supply, fill #1

## 2023-11-23 ENCOUNTER — Other Ambulatory Visit: Payer: Self-pay

## 2024-01-09 ENCOUNTER — Ambulatory Visit: Payer: Self-pay | Admitting: Internal Medicine

## 2024-02-06 ENCOUNTER — Other Ambulatory Visit: Payer: Self-pay | Admitting: Internal Medicine

## 2024-02-06 ENCOUNTER — Other Ambulatory Visit: Payer: Self-pay

## 2024-02-06 DIAGNOSIS — I10 Essential (primary) hypertension: Secondary | ICD-10-CM

## 2024-02-06 MED ORDER — AMLODIPINE BESYLATE 10 MG PO TABS
10.0000 mg | ORAL_TABLET | Freq: Every day | ORAL | 6 refills | Status: DC
  Filled 2024-02-06: qty 30, 30d supply, fill #0
  Filled 2024-04-30: qty 30, 30d supply, fill #1
  Filled 2024-04-30: qty 90, 90d supply, fill #1
  Filled 2024-08-08: qty 90, 90d supply, fill #2

## 2024-02-06 MED ORDER — TRUEPLUS LANCETS 28G MISC
2 refills | Status: DC
  Filled 2024-02-06: qty 100, 100d supply, fill #0
  Filled 2024-04-30 (×2): qty 100, 100d supply, fill #1
  Filled 2024-08-08: qty 100, 100d supply, fill #2

## 2024-02-07 ENCOUNTER — Other Ambulatory Visit: Payer: Self-pay

## 2024-02-08 ENCOUNTER — Other Ambulatory Visit: Payer: Self-pay

## 2024-02-19 ENCOUNTER — Ambulatory Visit: Payer: Self-pay | Attending: Internal Medicine | Admitting: Internal Medicine

## 2024-02-19 ENCOUNTER — Encounter: Payer: Self-pay | Admitting: Internal Medicine

## 2024-02-19 VITALS — BP 158/68 | HR 81 | Temp 98.1°F | Ht 62.0 in | Wt 142.0 lb

## 2024-02-19 DIAGNOSIS — Z1211 Encounter for screening for malignant neoplasm of colon: Secondary | ICD-10-CM

## 2024-02-19 DIAGNOSIS — I10 Essential (primary) hypertension: Secondary | ICD-10-CM

## 2024-02-19 DIAGNOSIS — E1159 Type 2 diabetes mellitus with other circulatory complications: Secondary | ICD-10-CM

## 2024-02-19 DIAGNOSIS — E1169 Type 2 diabetes mellitus with other specified complication: Secondary | ICD-10-CM

## 2024-02-19 DIAGNOSIS — E119 Type 2 diabetes mellitus without complications: Secondary | ICD-10-CM

## 2024-02-19 DIAGNOSIS — E785 Hyperlipidemia, unspecified: Secondary | ICD-10-CM

## 2024-02-19 DIAGNOSIS — E039 Hypothyroidism, unspecified: Secondary | ICD-10-CM

## 2024-02-19 DIAGNOSIS — Z7984 Long term (current) use of oral hypoglycemic drugs: Secondary | ICD-10-CM

## 2024-02-19 DIAGNOSIS — D509 Iron deficiency anemia, unspecified: Secondary | ICD-10-CM

## 2024-02-19 DIAGNOSIS — I152 Hypertension secondary to endocrine disorders: Secondary | ICD-10-CM

## 2024-02-19 LAB — POCT GLYCOSYLATED HEMOGLOBIN (HGB A1C): HbA1c, POC (controlled diabetic range): 7.3 % — AB (ref 0.0–7.0)

## 2024-02-19 LAB — GLUCOSE, POCT (MANUAL RESULT ENTRY): POC Glucose: 90 mg/dL (ref 70–99)

## 2024-02-19 NOTE — Progress Notes (Signed)
 Patient ID: Cynthia Wiley, female    DOB: Apr 17, 1966  MRN: [id number]  CC: Diabetes (DM f/u./No questions / concerns/Already received fluvax)   Subjective: Cynthia Wiley is a 58 y.o. female who presents for chronic ds management. Her concerns today include:  pt with hx of  DM type 2, HTN, HL, iron def and hypothyroid   AMN Language interpreter used during this encounter. #Cynthia Wiley [id number]   DIABETES TYPE 2 Last A1C:   Results for orders placed or performed in visit on 02/19/24  POCT glucose (manual entry)   Collection Time: 02/19/24  1:48 PM  Result Value Ref Range   POC Glucose 90 70 - 99 mg/dl  POCT glycosylated hemoglobin (Hb A1C)   Collection Time: 02/19/24  1:54 PM  Result Value Ref Range   Hemoglobin A1C     HbA1c POC (<> result, manual entry)     HbA1c, POC (prediabetic range)     HbA1c, POC (controlled diabetic range) 7.3 (A) 0.0 - 7.0 %   A1C has improved from 8.1 on last visit Med Adherence:  [x]  Yes - Metformin 1 gram BID Medication side effects:  []  Yes    [x]  No Home Monitoring?  [x]  Yes in a.m before BF every morning Home glucose results range: 95-130 Diet Adherence: [x]  Yes    []  No Exercise: [x]  Yes daily for 45 mins Hypoglycemic episodes?: []  Yes    [x]  No Numbness of the feet? []  Yes    [x]  No Retinopathy hx? []  Yes    []  No Last eye exam: no eye exam as yet. Pt uninsured.   Comments:   HTN: compliant with Norvasc 10 mg daily but has not taken as yet for today. Usually takes it in the afternoons around 3-4 p.m.  Limits salt in foods.  No CP/SOB. -does not check BP, no device.  HL: taking and tolerating Lipitor 40 mg daily   Hypothyroidism: Compliant with levothyroxine 75 mcg daily. Last TSH 2.3 in May 2024.  Iron deficiency anemia: On last visit 4 months ago, H/H was 8.2/28.6.  Previously it was 12.1/36.  Patient was advised to restart iron supplement and take daily. Taking it 2-3x/wk. No dizziness or fatigue.  No blood in stools.   No vaginal bleeding  HM: Due for diabetic eye exam and colon cancer screening  Patient Active Problem List   Diagnosis Date Noted   Pain in clavicular joint 05/09/2023   Anemia 02/14/2019   Hyperlipidemia 08/07/2017   DM2 (diabetes mellitus, type 2) (HCC) 01/22/2015   Hypothyroidism 01/22/2015     Current Outpatient Medications on File Prior to Visit  Medication Sig Dispense Refill   amLODipine (NORVASC) 10 MG tablet Take 1 tablet (10 mg total) by mouth daily. 30 tablet 6   aspirin EC 81 MG tablet Take 1 tablet (81 mg total) by mouth daily. 100 tablet 1   atorvastatin (LIPITOR) 40 MG tablet Take 1 tablet (40 mg total) by mouth daily. 90 tablet 1   Blood Glucose Monitoring Suppl (TRUE METRIX METER) w/Device KIT Use as directed as needed. 1 kit 0   diclofenac Sodium (VOLTAREN) 1 % GEL Apply 2 g topically 4 (four) times daily. To affected area 100 g 0   ferrous sulfate 325 (65 FE) MG tablet Take 1 tablet (325 mg total) by mouth daily with breakfast. 100 tablet 2   glucose blood (TRUE METRIX BLOOD GLUCOSE TEST) test strip USE AS INSTRUCTED 100 strip 2   levothyroxine (SYNTHROID) 50  MCG tablet Take 1.5 tablets (75 mcg total) by mouth daily. 45 tablet 2   meloxicam (MOBIC) 7.5 MG tablet Take 1 tablet (7.5 mg total) by mouth daily. 30 tablet 0   metFORMIN (GLUCOPHAGE) 1000 MG tablet Take 1 tablet (1,000 mg total) by mouth 2 (two) times daily with a meal. 180 tablet 1   TRUEplus Lancets 28G MISC Use to check blood sugar once daily. 100 each 2   No current facility-administered medications on file prior to visit.    Allergies  Allergen Reactions   Onglyza [Saxagliptin] Nausea Only   Sulfonylureas Other (See Comments)    Severe stomach ache with Amaryl and glipizide XL     Social History   Socioeconomic History   Marital status: Single    Spouse name: Not on file   Number of children: Not on file   Years of education: Not on file   Highest education level: Not on file  Occupational  History   Not on file  Tobacco Use   Smoking status: Never   Smokeless tobacco: Never  Vaping Use   Vaping status: Never Used  Substance and Sexual Activity   Alcohol use: No   Drug use: No   Sexual activity: Yes    Birth control/protection: None  Other Topics Concern   Not on file  Social History Narrative   Not on file   Social Drivers of Health   Financial Resource Strain: High Risk (02/19/2024)   Overall Financial Resource Strain (CARDIA)    Difficulty of Paying Living Expenses: Very hard  Food Insecurity: No Food Insecurity (02/19/2024)   Hunger Vital Sign    Worried About Running Out of Food in the Last Year: Never true    Ran Out of Food in the Last Year: Never true  Transportation Needs: No Transportation Needs (02/19/2024)   PRAPARE - Administrator, Civil Service (Medical): No    Lack of Transportation (Non-Medical): No  Physical Activity: Sufficiently Active (02/19/2024)   Exercise Vital Sign    Days of Exercise per Week: 6 days    Minutes of Exercise per Session: 60 min  Stress: No Stress Concern Present (02/19/2024)   [employer] of Occupational Health - Occupational Stress Questionnaire    Feeling of Stress : Only a little  Social Connections: Moderately Integrated (02/19/2024)   Social Connection and Isolation Panel [NHANES]    Frequency of Communication with Friends and Family: More than three times a week    Frequency of Social Gatherings with Friends and Family: More than three times a week    Attends Religious Services: More than 4 times per year    Active Member of Clubs or Organizations: Yes    Attends Banker Meetings: More than 4 times per year    Marital Status: Never married  Intimate Partner Violence: Not At Risk (02/19/2024)   Humiliation, Afraid, Rape, and Kick questionnaire    Fear of Current or Ex-Partner: No    Emotionally Abused: No    Physically Abused: No    Sexually Abused: No    Family History  Problem  Relation Age of Onset   Diabetes Father    Heart disease Father    Diabetes Sister    Hyperlipidemia Brother     Past Surgical History:  Procedure Laterality Date   ABDOMINAL HYSTERECTOMY N/A 01/31/2013   Procedure: HYSTERECTOMY ABDOMINAL;  Surgeon: Willodean Rosenthal, MD;  Location: [facility];  Service: Gynecology;  Laterality: N/A;   BILATERAL  SALPINGECTOMY Bilateral 01/31/2013   Procedure: BILATERAL SALPINGECTOMY;  Surgeon: Willodean Rosenthal, MD;  Location: [facility];  Service: Gynecology;  Laterality: Bilateral;   CYSTOSCOPY WITH STENT PLACEMENT Bilateral 01/31/2013   Procedure: CYSTOSCOPY WITH STENT PLACEMENT;  Surgeon: Anner Crete, MD;  Location: [facility];  Service: Urology;  Laterality: Bilateral;  with c-arm    ROS: Review of Systems Negative except as stated above  PHYSICAL EXAM: BP (!) 158/68   Pulse 81   Temp 98.1 F (36.7 C) (Oral)   Ht 5\' 2"  (1.575 m)   Wt 142 lb (64.4 kg)   LMP 01/19/2013   SpO2 100%   BMI 25.97 kg/m   Wt Readings from Last 3 Encounters:  02/19/24 142 lb (64.4 kg)  09/05/23 146 lb (66.2 kg)  05/09/23 138 lb 12.8 oz (63 kg)    Physical Exam  General appearance - alert, well appearing, and in no distress Mental status - normal mood, behavior, speech, dress, motor activity, and thought processes Eyes pale conjunctiva Neck - supple, no significant adenopathy Chest - clear to auscultation, no wheezes, rales or rhonchi, symmetric air entry Heart - normal rate, regular rhythm, normal S1, S2, no murmurs, rubs, clicks or gallops Extremities - trace LE edema Diabetic Foot Exam - Simple   Simple Foot Form Diabetic Foot exam was performed with the following findings: Yes 02/19/2024  2:22 PM  Visual Inspection See comments: Yes Sensation Testing Intact to touch and monofilament testing bilaterally: Yes Pulse Check Posterior Tibialis and Dorsalis pulse intact bilaterally: Yes Comments RT big toe nail is discolored and thick.        Latest  Ref Rng & Units 12/22/2022    4:46 PM 12/17/2021    2:33 PM 08/13/2021    3:14 PM  CMP  Glucose 70 - 99 mg/dL 83  91  161   BUN 6 - 24 mg/dL 9  12  12    Creatinine 0.57 - 1.00 mg/dL 0.96  0.45  4.09   Sodium 134 - 144 mmol/L 138  140  142   Potassium 3.5 - 5.2 mmol/L 3.8  4.0  4.9   Chloride 96 - 106 mmol/L 98  103  102   CO2 20 - 29 mmol/L 23  23  23    Calcium 8.7 - 10.2 mg/dL 9.5  9.5  81.1   Total Protein 6.0 - 8.5 g/dL 7.3  6.9    Total Bilirubin 0.0 - 1.2 mg/dL 0.5  0.6    Alkaline Phos 44 - 121 IU/L 102  101    AST 0 - 40 IU/L 20  23    ALT 0 - 32 IU/L 11  18     Lipid Panel     Component Value Date/Time   CHOL 122 12/22/2022 1646   TRIG 91 12/22/2022 1646   HDL 46 12/22/2022 1646   CHOLHDL 2.7 12/22/2022 1646   CHOLHDL 2.7 02/07/2017 1626   VLDL 17 02/07/2017 1626   LDLCALC 59 12/22/2022 1646    CBC    Component Value Date/Time   WBC 9.2 09/05/2023 1641   WBC 7.1 01/22/2015 1047   RBC 3.91 09/05/2023 1641   RBC 4.73 01/22/2015 1047   HGB 8.2 (L) 09/05/2023 1641   HCT 28.6 (L) 09/05/2023 1641   PLT 519 (H) 09/05/2023 1641   MCV 73 (L) 09/05/2023 1641   MCH 21.0 (L) 09/05/2023 1641   MCH 26.8 01/22/2015 1047   MCHC 28.7 (L) 09/05/2023 1641   MCHC 32.0 01/22/2015 1047  RDW 15.6 (H) 09/05/2023 1641   LYMPHSABS 2.4 01/06/2009 2230   MONOABS 0.6 01/06/2009 2230   EOSABS 0.1 01/06/2009 2230   BASOSABS 0.0 01/06/2009 2230    ASSESSMENT AND PLAN: 1. Type 2 diabetes mellitus with other specified complication, without long-term current use of insulin (HCC) (Primary) A1c improved but not at goal. Continue healthy eating habits and regular exercise. Recommend adding another medication to the metformin but patient declines Encourage to get DM eye exam done - POCT glycosylated hemoglobin (Hb A1C) - POCT glucose (manual entry) - CBC - Comprehensive metabolic panel - Microalbumin / creatinine urine ratio  2. Diabetes mellitus treated with oral medication (HCC) See  #1 above  3. Hypertension associated with type 2 diabetes mellitus (HCC) Not at goal.  She has not taken Norvasc as yet for today.  Advised to take as soon as she returns home.  Will bring her back in 1 month for blood pressure recheck.  DASH diet encouraged.  4. Hyperlipidemia associated with type 2 diabetes mellitus (HCC) Continue atorvastatin 40 mg daily. - Lipid panel  5. Acquired hypothyroidism Continue levothyroxine 75 mg daily. - TSH  6. Iron deficiency anemia, unspecified iron deficiency anemia type Recommend taking iron supplement daily.  I can tell from pale conjunctive that she is still anemic Recommend c-scope but pt declines prefers stool test  7. Screening for colon cancer - Fecal occult blood, imunochemical([facility])   Patient was given the opportunity to ask questions.  Patient verbalized understanding of the plan and was able to repeat key elements of the plan.   This documentation was completed using Paediatric nurse.  Any transcriptional errors are unintentional.  Orders Placed This Encounter  Procedures   Fecal occult blood, imunochemical([facility])   CBC   Comprehensive metabolic panel   Lipid panel   TSH   Microalbumin / creatinine urine ratio   POCT glycosylated hemoglobin (Hb A1C)   POCT glucose (manual entry)     Requested Prescriptions    No prescriptions requested or ordered in this encounter    Return in about 4 weeks (around 03/18/2024) for BP recheck.  Jonah Blue, MD, FACP

## 2024-02-20 NOTE — Progress Notes (Signed)
 Let patient know that she is severely anemic with blood cell count of 6.3.  This is worse compared to when it was last checked in October.  I recommend that she starts taking the iron supplement twice a day for the next 3 weeks then after that 1 tablet daily.  Please return to the lab in 4 weeks for recheck of blood count.  Given the severity of the anemia, I recommend that we refer her to the gastroenterologist for colonoscopy to make sure she does not have any cancerous lesion in the gastrointestinal tract causing the iron deficiency.  Let me know if she is agreeable to the referral. Sodium level is low.  We will need to repeat this.  Please return to the lab later this week to have a repeat level check and additional blood studies done to evaluate this further.  Cholesterol level is elevated at 116 with goal being less than 70.  Please confirm that she is taking the atorvastatin consistently.  Thyroid level is normal.

## 2024-02-21 LAB — LIPID PANEL
Chol/HDL Ratio: 3.7 ratio (ref 0.0–4.4)
Cholesterol, Total: 191 mg/dL (ref 100–199)
HDL: 51 mg/dL (ref >39)
LDL Chol Calc (NIH): 116 mg/dL — ABNORMAL HIGH (ref 0–99)
Triglycerides: 134 mg/dL (ref 0–149)
VLDL Cholesterol Cal: 24 mg/dL (ref 5–40)

## 2024-02-21 LAB — COMPREHENSIVE METABOLIC PANEL
ALT: 13 IU/L (ref 0–32)
AST: 19 IU/L (ref 0–40)
Albumin: 4.1 g/dL (ref 3.8–4.9)
Alkaline Phosphatase: 88 IU/L (ref 44–121)
BUN/Creatinine Ratio: 17 (ref 9–23)
BUN: 11 mg/dL (ref 6–24)
Bilirubin Total: 0.4 mg/dL (ref 0.0–1.2)
CO2: 20 mmol/L (ref 20–29)
Calcium: 9.2 mg/dL (ref 8.7–10.2)
Chloride: 94 mmol/L — ABNORMAL LOW (ref 96–106)
Creatinine, Ser: 0.66 mg/dL (ref 0.57–1.00)
Globulin, Total: 2.7 g/dL (ref 1.5–4.5)
Glucose: 93 mg/dL (ref 70–99)
Potassium: 4.3 mmol/L (ref 3.5–5.2)
Sodium: 128 mmol/L — ABNORMAL LOW (ref 134–144)
Total Protein: 6.8 g/dL (ref 6.0–8.5)
eGFR: 102 mL/min/{1.73_m2} (ref >59)

## 2024-02-21 LAB — CBC
Hematocrit: 23.2 % — ABNORMAL LOW (ref 34.0–46.6)
Hemoglobin: 6.3 g/dL — CL (ref 11.1–15.9)
MCH: 17.4 pg — ABNORMAL LOW (ref 26.6–33.0)
MCHC: 27.2 g/dL — ABNORMAL LOW (ref 31.5–35.7)
MCV: 64 fL — ABNORMAL LOW (ref 79–97)
Platelets: 602 10*3/uL — ABNORMAL HIGH (ref 150–450)
RBC: 3.62 x10E6/uL — ABNORMAL LOW (ref 3.77–5.28)
RDW: 17.1 % — ABNORMAL HIGH (ref 11.7–15.4)
WBC: 8.3 10*3/uL (ref 3.4–10.8)

## 2024-02-21 LAB — TSH: TSH: 1.65 u[IU]/mL (ref 0.450–4.500)

## 2024-02-21 LAB — MICROALBUMIN / CREATININE URINE RATIO: Creatinine, Urine: 10.4 mg/dL

## 2024-02-22 ENCOUNTER — Telehealth: Payer: Self-pay

## 2024-02-22 NOTE — Telephone Encounter (Signed)
 Called & spoke to the patient. Addressed all concerns. Please see lab results for further information.

## 2024-02-22 NOTE — Telephone Encounter (Signed)
 Copied from CRM [id number]. Topic: General - Other >> Feb 21, 2024  9:25 AM Alphonzo Lemmings O wrote: Reason for [id number] is returning the call concerning her labs. I tried reading them with the interpreter but she needs a better understanding  patient says she wasn't taking the atorvastatin consistently and she says .no to the referral because she has already had a colon test or a stool tyst but I dont think she is really understanding . Called cal and they said they would have the cma to give her a call back .

## 2024-02-23 LAB — FECAL OCCULT BLOOD, IMMUNOCHEMICAL

## 2024-02-26 ENCOUNTER — Other Ambulatory Visit: Payer: Self-pay

## 2024-02-26 ENCOUNTER — Ambulatory Visit: Payer: Self-pay | Attending: Internal Medicine

## 2024-02-26 ENCOUNTER — Other Ambulatory Visit: Payer: Self-pay | Admitting: Internal Medicine

## 2024-02-26 DIAGNOSIS — E871 Hypo-osmolality and hyponatremia: Secondary | ICD-10-CM

## 2024-02-26 DIAGNOSIS — E1169 Type 2 diabetes mellitus with other specified complication: Secondary | ICD-10-CM

## 2024-02-26 DIAGNOSIS — E039 Hypothyroidism, unspecified: Secondary | ICD-10-CM

## 2024-02-27 ENCOUNTER — Other Ambulatory Visit: Payer: Self-pay

## 2024-02-27 ENCOUNTER — Telehealth: Payer: Self-pay

## 2024-02-27 LAB — BASIC METABOLIC PANEL
BUN/Creatinine Ratio: 15 (ref 9–23)
BUN: 9 mg/dL (ref 6–24)
CO2: 20 mmol/L (ref 20–29)
Calcium: 9.3 mg/dL (ref 8.7–10.2)
Chloride: 99 mmol/L (ref 96–106)
Creatinine, Ser: 0.61 mg/dL (ref 0.57–1.00)
Glucose: 90 mg/dL (ref 70–99)
Potassium: 4.6 mmol/L (ref 3.5–5.2)
Sodium: 133 mmol/L — ABNORMAL LOW (ref 134–144)
eGFR: 104 mL/min/{1.73_m2} (ref >59)

## 2024-02-27 MED ORDER — LEVOTHYROXINE SODIUM 50 MCG PO TABS
75.0000 ug | ORAL_TABLET | Freq: Every day | ORAL | 2 refills | Status: DC
  Filled 2024-02-27 (×2): qty 45, 30d supply, fill #0
  Filled 2024-03-27: qty 45, 30d supply, fill #1
  Filled 2024-04-30 (×2): qty 45, 30d supply, fill #2

## 2024-02-27 NOTE — Telephone Encounter (Signed)
 Requested Prescriptions  Pending Prescriptions Disp Refills   levothyroxine (SYNTHROID) 50 MCG tablet 45 tablet 2    Sig: Take 1.5 tablets (75 mcg total) by mouth daily.     Endocrinology:  Hypothyroid Agents Passed - 02/27/2024  4:11 PM      Passed - TSH in normal range and within 360 days    TSH  Date Value Ref Range Status  02/19/2024 1.650 0.450 - 4.500 uIU/mL Final         Passed - Valid encounter within last 12 months    Recent Outpatient Visits           1 week ago Type 2 diabetes mellitus with other specified complication, without long-term current use of insulin (HCC)   [facility] - [facility] Dept Of [place]. [facility] Jonah Blue B, MD   5 months ago Type 2 diabetes mellitus with hyperlipidemia [facility])   [facility] - [facility] [place] [facility] [place]. [facility] Marcine Matar, MD   9 months ago Pain of right sternoclavicular joint   [facility] - A Dept Of [place]. [facility] Storm Frisk, MD   10 months ago Type 2 diabetes mellitus with hyperlipidemia [facility])   [facility] - A Dept Of [place]. [facility] Jonah Blue B, MD   1 year ago Type 2 diabetes mellitus without complication, without long-term current use of insulin (HCC)   [facility] - A Dept Of [place]. [facility] Marcine Matar, MD       Future Appointments             In 1 month Laural Benes Binnie Rail, MD [facility] - A Dept Of [place] [facility]. [facility]

## 2024-02-27 NOTE — Telephone Encounter (Signed)
 Patient came in requesting refills for levothyroxine (SYNTHROID) 50 MCG tablet

## 2024-02-27 NOTE — Telephone Encounter (Signed)
 Called & spoke to the patient. Verified name & DOB. Informed that an additional refill has been sent to the pharmacy. Patient expressed verbal understanding.

## 2024-02-28 ENCOUNTER — Other Ambulatory Visit: Payer: Self-pay

## 2024-02-28 NOTE — Progress Notes (Signed)
 Sodium level still mildly low but much improved from previous level.

## 2024-03-27 ENCOUNTER — Other Ambulatory Visit: Payer: Self-pay | Admitting: Internal Medicine

## 2024-03-27 ENCOUNTER — Ambulatory Visit: Payer: Self-pay | Attending: Internal Medicine

## 2024-03-27 ENCOUNTER — Other Ambulatory Visit: Payer: Self-pay

## 2024-03-27 DIAGNOSIS — D509 Iron deficiency anemia, unspecified: Secondary | ICD-10-CM

## 2024-03-28 ENCOUNTER — Encounter: Payer: Self-pay | Admitting: Internal Medicine

## 2024-03-28 LAB — CBC
Hematocrit: 24.9 % — ABNORMAL LOW (ref 34.0–46.6)
Hemoglobin: 6.6 g/dL — CL (ref 11.1–15.9)
MCH: 17.6 pg — ABNORMAL LOW (ref 26.6–33.0)
MCHC: 26.5 g/dL — ABNORMAL LOW (ref 31.5–35.7)
MCV: 66 fL — ABNORMAL LOW (ref 79–97)
Platelets: 533 10*3/uL — ABNORMAL HIGH (ref 150–450)
RBC: 3.75 x10E6/uL — ABNORMAL LOW (ref 3.77–5.28)
RDW: 19.7 % — ABNORMAL HIGH (ref 11.7–15.4)
WBC: 8.7 10*3/uL (ref 3.4–10.8)

## 2024-03-28 LAB — IRON,TIBC AND FERRITIN PANEL
Ferritin: 8 ng/mL — ABNORMAL LOW (ref 15–150)
Iron Saturation: 3 % — CL (ref 15–55)
Iron: 12 ug/dL — ABNORMAL LOW (ref 27–159)
Total Iron Binding Capacity: 364 ug/dL (ref 250–450)
UIBC: 352 ug/dL (ref 131–425)

## 2024-03-28 NOTE — Progress Notes (Signed)
 Let patient know that she is still extremely anemic.  Has she been taking the iron supplement 1 tablet daily consistently for the past 1 month?  If so, advised that she increase to 1 tablet twice a day.  Take the iron supplement with some sips of orange juice so that it will be absorbed better from the stomach. -If she is experiencing any dizziness or fatigue?  If so, we can arrange for her to get a blood transfusion.  Otherwise I would like to refer her to the hematologist to be offered iron infusion.  Again I strongly advised that we also refer to gastroenterology for colonoscopy to make sure she does not have any underlying colon cancer causing this significant anemia.

## 2024-04-03 ENCOUNTER — Telehealth: Payer: Self-pay | Admitting: Internal Medicine

## 2024-04-03 DIAGNOSIS — E611 Iron deficiency: Secondary | ICD-10-CM

## 2024-04-03 NOTE — Telephone Encounter (Signed)
-----   Message from [facility] Clarisa A sent at 04/01/2024  8:53 AM EDT ----- Called & spoke to the patient. Verified name & DOB. Informed of results & recommendations. Patient stated that she has been taking her iron supplement daily & consistently for the past month & even took it twice a day when previously instructed. Patient agreed to a referral to a hematologist but declined referral to a gastroenterologist due to not agreeing with method of procedure. Patient expressed verbal understanding that she is extremely anemic, will take supplement with orange juice.

## 2024-04-11 ENCOUNTER — Ambulatory Visit: Payer: Self-pay | Admitting: Internal Medicine

## 2024-04-25 ENCOUNTER — Telehealth: Payer: Self-pay | Admitting: Internal Medicine

## 2024-04-25 NOTE — Telephone Encounter (Signed)
 FYI Copied from CRM [id number]. Topic: Referral - Status >> Apr 25, 2024  2:47 PM Crispin Dolphin wrote:  Reason for CRM: Cynthia Wiley called from [facility]. They received a referral . Has tried multiple times to reach patient with no success. Will close referral but provider may reach out to them if she needs to. Thank You

## 2024-04-25 NOTE — Telephone Encounter (Signed)
 Please try to reach her again tomorrow or sometime next week.  Give her their information so that she can call them to schedule the appointment.  She was referred because of severe anemia.

## 2024-04-25 NOTE — Telephone Encounter (Signed)
FYI. Called but no answer. LVM to call back.

## 2024-04-26 NOTE — Telephone Encounter (Signed)
 Called & spoke to the patient. Verified name & DOB. Informed that hematology / oncology has been trying to reach the patient. Patient stated that she was confused and thought that it was the gastroenterologist trying to contact her. Clarified with patient that the referral was sent to hematology / oncology who are blood specialists to address her severe anemia. Patient stated that she is uninsured. Advised patient to get a [facility] financial assistance packet for the discount. Patient expressed verbal understanding and will pick up packet to start the process.

## 2024-04-30 ENCOUNTER — Other Ambulatory Visit: Payer: Self-pay | Admitting: Internal Medicine

## 2024-04-30 ENCOUNTER — Other Ambulatory Visit: Payer: Self-pay

## 2024-04-30 DIAGNOSIS — E119 Type 2 diabetes mellitus without complications: Secondary | ICD-10-CM

## 2024-04-30 DIAGNOSIS — E1169 Type 2 diabetes mellitus with other specified complication: Secondary | ICD-10-CM

## 2024-04-30 MED ORDER — TRUE METRIX BLOOD GLUCOSE TEST VI STRP
ORAL_STRIP | 2 refills | Status: AC
  Filled 2024-04-30: qty 100, 90d supply, fill #0
  Filled 2024-08-08: qty 100, 90d supply, fill #1
  Filled 2024-11-05: qty 100, 90d supply, fill #2

## 2024-04-30 MED ORDER — METFORMIN HCL 1000 MG PO TABS
1000.0000 mg | ORAL_TABLET | Freq: Two times a day (BID) | ORAL | 1 refills | Status: DC
  Filled 2024-04-30: qty 180, 90d supply, fill #0
  Filled 2024-08-08: qty 180, 90d supply, fill #1

## 2024-05-01 ENCOUNTER — Other Ambulatory Visit: Payer: Self-pay

## 2024-05-22 NOTE — Telephone Encounter (Signed)
 Called but no answer. LVM for patient to call back if there are any questions or concerns in regard to the [facility] Financial assistance.

## 2024-05-23 ENCOUNTER — Telehealth: Payer: Self-pay | Admitting: Internal Medicine

## 2024-05-23 NOTE — Telephone Encounter (Signed)
 Copied from CRM [id number]. Topic: General - Other >> May 23, 2024 11:26 AM Elle L wrote: Reason for CRM: The patient states she has not completed her entire financial assistance application so she is unsure if she is unable to turn it in or if she should wait. She is missing the proof of residency letter. The patient's call back number is [phone]. We spoke using Interpreter Cornelius Dill ID [phone].

## 2024-06-03 ENCOUNTER — Other Ambulatory Visit: Payer: Self-pay | Admitting: Internal Medicine

## 2024-06-03 ENCOUNTER — Other Ambulatory Visit: Payer: Self-pay

## 2024-06-03 DIAGNOSIS — E039 Hypothyroidism, unspecified: Secondary | ICD-10-CM

## 2024-06-04 ENCOUNTER — Other Ambulatory Visit: Payer: Self-pay

## 2024-06-04 MED ORDER — LEVOTHYROXINE SODIUM 50 MCG PO TABS
75.0000 ug | ORAL_TABLET | Freq: Every day | ORAL | 2 refills | Status: DC
  Filled 2024-06-04: qty 45, 30d supply, fill #0
  Filled 2024-07-08: qty 45, 30d supply, fill #1
  Filled 2024-08-27: qty 45, 30d supply, fill #2

## 2024-06-11 ENCOUNTER — Ambulatory Visit: Payer: Self-pay | Attending: Internal Medicine | Admitting: Internal Medicine

## 2024-06-11 ENCOUNTER — Other Ambulatory Visit: Payer: Self-pay

## 2024-06-11 ENCOUNTER — Encounter: Payer: Self-pay | Admitting: Internal Medicine

## 2024-06-11 VITALS — BP 124/69 | HR 70 | Temp 98.4°F | Ht 62.0 in | Wt 144.0 lb

## 2024-06-11 DIAGNOSIS — Z7984 Long term (current) use of oral hypoglycemic drugs: Secondary | ICD-10-CM

## 2024-06-11 DIAGNOSIS — I10 Essential (primary) hypertension: Secondary | ICD-10-CM

## 2024-06-11 DIAGNOSIS — E1169 Type 2 diabetes mellitus with other specified complication: Secondary | ICD-10-CM

## 2024-06-11 DIAGNOSIS — E611 Iron deficiency: Secondary | ICD-10-CM

## 2024-06-11 DIAGNOSIS — E119 Type 2 diabetes mellitus without complications: Secondary | ICD-10-CM

## 2024-06-11 DIAGNOSIS — I152 Hypertension secondary to endocrine disorders: Secondary | ICD-10-CM

## 2024-06-11 DIAGNOSIS — E039 Hypothyroidism, unspecified: Secondary | ICD-10-CM

## 2024-06-11 DIAGNOSIS — E785 Hyperlipidemia, unspecified: Secondary | ICD-10-CM

## 2024-06-11 DIAGNOSIS — Z1211 Encounter for screening for malignant neoplasm of colon: Secondary | ICD-10-CM

## 2024-06-11 LAB — POCT GLYCOSYLATED HEMOGLOBIN (HGB A1C): HbA1c, POC (controlled diabetic range): 7.2 % — AB (ref 0.0–7.0)

## 2024-06-11 LAB — GLUCOSE, POCT (MANUAL RESULT ENTRY): POC Glucose: 89 mg/dL (ref 70–99)

## 2024-06-11 MED ORDER — ATORVASTATIN CALCIUM 40 MG PO TABS
40.0000 mg | ORAL_TABLET | Freq: Every day | ORAL | 1 refills | Status: AC
  Filled 2024-06-11 – 2024-08-08 (×2): qty 90, 90d supply, fill #0
  Filled 2024-11-05: qty 90, 90d supply, fill #1

## 2024-06-11 MED ORDER — FERROUS SULFATE 325 (65 FE) MG PO TABS
325.0000 mg | ORAL_TABLET | Freq: Two times a day (BID) | ORAL | 1 refills | Status: DC
  Filled 2024-06-11: qty 180, 90d supply, fill #0

## 2024-06-11 NOTE — Progress Notes (Signed)
 Patient ID: Cynthia Wiley, female    DOB: 1966/04/08  MRN: [id number]  CC: Diabetes (DM f/u. Med refill. /Discuss if vitamin B 12 is needed /No to all vax )   Subjective: Cynthia Wiley is a 58 y.o. female who presents for chronic ds management. Her concerns today include:  pt with hx of  DM type 2, HTN, HL, iron def and hypothyroid   [employer] Language interpreter used during this encounter. #[id number], Salma  Discussed the use of AI scribe software for clinical note transcription with the patient, who gave verbal consent to proceed.  History of Present Illness Cynthia Wiley is a 58 year old female who presents for follow-up.  She manages her diabetes with metformin  1000 mg twice daily. Her A1c is 7.2, slightly improved from 7.3 four months ago. Morning blood sugar readings range from 98 to 115, occasionally reaching 130-132. She adheres to her medication regimen, maintains healthy eating habits, and exercises daily by walking 40 minutes to an hour.  Hypertension is controlled with amlodipine  10 mg daily, and she is consistent with her medication and sodium intake. She experiences no chest pain or shortness of breath.  Hyperlipidemia is managed with atorvastatin , which she takes consistently.  Iron deficiency anemia is treated with iron supplements, recently reduced to once daily from BID due to forgetfulness. Her last Hb 6.6, with no dizziness or fatigue.  I had advised referral to GI for colonoscopy but patient declined stating that she does not wish to have it done.  She takes levothyroxine  50 mcg daily for thyroid management, with previous tests showing normal thyroid levels.    Patient Active Problem List   Diagnosis Date Noted   Pain in clavicular joint 05/09/2023   Anemia 02/14/2019   Hyperlipidemia 08/07/2017   DM2 (diabetes mellitus, type 2) (HCC) 01/22/2015   Hypothyroidism 01/22/2015     Current Outpatient Medications on File Prior to Visit   Medication Sig Dispense Refill   amLODipine  (NORVASC ) 10 MG tablet Take 1 tablet (10 mg total) by mouth daily. 30 tablet 6   aspirin  EC 81 MG tablet Take 1 tablet (81 mg total) by mouth daily. 100 tablet 1   Blood Glucose Monitoring Suppl (TRUE METRIX METER) w/Device KIT Use as directed as needed. 1 kit 0   diclofenac  Sodium (VOLTAREN ) 1 % GEL Apply 2 g topically 4 (four) times daily. To affected area 100 g 0   glucose blood (TRUE METRIX BLOOD GLUCOSE TEST) test strip USE AS INSTRUCTED 100 strip 2   levothyroxine  (SYNTHROID ) 50 MCG tablet Take 1.5 tablets (75 mcg total) by mouth daily. 45 tablet 2   metFORMIN  (GLUCOPHAGE ) 1000 MG tablet Take 1 tablet (1,000 mg total) by mouth 2 (two) times daily with a meal. 180 tablet 1   TRUEplus Lancets 28G MISC Use to check blood sugar once daily. 100 each 2   meloxicam  (MOBIC ) 7.5 MG tablet Take 1 tablet (7.5 mg total) by mouth daily. (Patient not taking: Reported on 06/11/2024) 30 tablet 0   No current facility-administered medications on file prior to visit.    Allergies  Allergen Reactions   Onglyza [Saxagliptin ] Nausea Only   Sulfonylureas Other (See Comments)    Severe stomach ache with Amaryl  and glipizide  XL     Social History   Socioeconomic History   Marital status: Single    Spouse name: Not on file   Number of children: Not on file   Years of education: Not on  file   Highest education level: Not on file  Occupational History   Not on file  Tobacco Use   Smoking status: Never   Smokeless tobacco: Never  Vaping Use   Vaping status: Never Used  Substance and Sexual Activity   Alcohol use: No   Drug use: No   Sexual activity: Yes    Birth control/protection: None  Other Topics Concern   Not on file  Social History Narrative   Not on file   Social Drivers of Health   Financial Resource Strain: High Risk (02/19/2024)   Overall Financial Resource Strain (CARDIA)    Difficulty of Paying Living Expenses: Very hard  Food  Insecurity: No Food Insecurity (02/19/2024)   Hunger Vital Sign    Worried About Running Out of Food in the Last Year: Never true    Ran Out of Food in the Last Year: Never true  Transportation Needs: No Transportation Needs (02/19/2024)   PRAPARE - Administrator, Civil Service (Medical): No    Lack of Transportation (Non-Medical): No  Physical Activity: Sufficiently Active (02/19/2024)   Exercise Vital Sign    Days of Exercise per Week: 6 days    Minutes of Exercise per Session: 60 min  Stress: No Stress Concern Present (02/19/2024)   [employer] of Occupational Health - Occupational Stress Questionnaire    Feeling of Stress : Only a little  Social Connections: Moderately Integrated (02/19/2024)   Social Connection and Isolation Panel    Frequency of Communication with Friends and Family: More than three times a week    Frequency of Social Gatherings with Friends and Family: More than three times a week    Attends Religious Services: More than 4 times per year    Active Member of Clubs or Organizations: Yes    Attends Banker Meetings: More than 4 times per year    Marital Status: Never married  Intimate Partner Violence: Not At Risk (02/19/2024)   Humiliation, Afraid, Rape, and Kick questionnaire    Fear of Current or Ex-Partner: No    Emotionally Abused: No    Physically Abused: No    Sexually Abused: No    Family History  Problem Relation Age of Onset   Diabetes Father    Heart disease Father    Diabetes Sister    Hyperlipidemia Brother     Past Surgical History:  Procedure Laterality Date   ABDOMINAL HYSTERECTOMY N/A 01/31/2013   Procedure: HYSTERECTOMY ABDOMINAL;  Surgeon: Elveria Mungo, MD;  Location: [facility];  Service: Gynecology;  Laterality: N/A;   BILATERAL SALPINGECTOMY Bilateral 01/31/2013   Procedure: BILATERAL SALPINGECTOMY;  Surgeon: Elveria Mungo, MD;  Location: [facility];  Service: Gynecology;  Laterality: Bilateral;    CYSTOSCOPY WITH STENT PLACEMENT Bilateral 01/31/2013   Procedure: CYSTOSCOPY WITH STENT PLACEMENT;  Surgeon: Norleen JINNY Seltzer, MD;  Location: [facility];  Service: Urology;  Laterality: Bilateral;  with c-arm    ROS: Review of Systems Negative except as stated above  PHYSICAL EXAM: BP 124/69 (BP Location: Left Arm, Patient Position: Sitting, Cuff Size: Normal)   Pulse 70   Temp 98.4 F (36.9 C) (Oral)   Ht 5' 2 (1.575 m)   Wt 144 lb (65.3 kg)   LMP 01/19/2013   SpO2 100%   BMI 26.34 kg/m   Wt Readings from Last 3 Encounters:  06/11/24 144 lb (65.3 kg)  02/19/24 142 lb (64.4 kg)  09/05/23 146 lb (66.2 kg)    Physical Exam  General appearance - alert, middle-aged Hispanic female who appears pale Mental status - normal mood, behavior, speech, dress, motor activity, and thought processes Eyes -pale conjunctiva Chest - clear to auscultation, no wheezes, rales or rhonchi, symmetric air entry Heart - normal rate, regular rhythm, normal S1, S2, no murmurs, rubs, clicks or gallops Extremities - peripheral pulses normal, no pedal edema, no clubbing or cyanosis      Latest Ref Rng & Units 02/26/2024    5:06 PM 02/19/2024    2:34 PM 12/22/2022    4:46 PM  CMP  Glucose 70 - 99 mg/dL 90  93  83   BUN 6 - 24 mg/dL 9  11  9    Creatinine 0.57 - 1.00 mg/dL 9.38  9.33  9.29   Sodium 134 - 144 mmol/L 133  128  138   Potassium 3.5 - 5.2 mmol/L 4.6  4.3  3.8   Chloride 96 - 106 mmol/L 99  94  98   CO2 20 - 29 mmol/L 20  20  23    Calcium  8.7 - 10.2 mg/dL 9.3  9.2  9.5   Total Protein 6.0 - 8.5 g/dL  6.8  7.3   Total Bilirubin 0.0 - 1.2 mg/dL  0.4  0.5   Alkaline Phos 44 - 121 IU/L  88  102   AST 0 - 40 IU/L  19  20   ALT 0 - 32 IU/L  13  11    Lipid Panel     Component Value Date/Time   CHOL 191 02/19/2024 1434   TRIG 134 02/19/2024 1434   HDL 51 02/19/2024 1434   CHOLHDL 3.7 02/19/2024 1434   CHOLHDL 2.7 02/07/2017 1626   VLDL 17 02/07/2017 1626   LDLCALC 116 (H) 02/19/2024 1434     CBC    Component Value Date/Time   WBC 8.7 03/27/2024 1511   WBC 7.1 01/22/2015 1047   RBC 3.75 (L) 03/27/2024 1511   RBC 4.73 01/22/2015 1047   HGB 6.6 (LL) 03/27/2024 1511   HCT 24.9 (L) 03/27/2024 1511   PLT 533 (H) 03/27/2024 1511   MCV 66 (L) 03/27/2024 1511   MCH 17.6 (L) 03/27/2024 1511   MCH 26.8 01/22/2015 1047   MCHC 26.5 (L) 03/27/2024 1511   MCHC 32.0 01/22/2015 1047   RDW 19.7 (H) 03/27/2024 1511   LYMPHSABS 2.4 01/06/2009 2230   MONOABS 0.6 01/06/2009 2230   EOSABS 0.1 01/06/2009 2230   BASOSABS 0.0 01/06/2009 2230      Assessment and Plan Assessment & Plan 1. Type 2 diabetes mellitus with other specified complication, without long-term current use of insulin  (HCC) (Primary) 2. Diabetes mellitus treated with oral medication (HCC) Type 2 Diabetes Mellitus with HbA1c of 7.2%, slight improvement from 7.3% four months ago. Goal: maintain HbA1c below 7%. Blood glucose generally within target, occasional elevations. Adheres to metformin , diet, exercise. - Continue metformin  1000 mg twice daily.  Patient has been reluctant to add medication to the Ozempic.  I think it is reasonable for her to continue the metformin  1 g twice a day. - Encourage adherence to dietary modifications and regular exercise. - Encourage daily blood glucose monitoring.  Hypertension At goal. - Continue amlodipine  10 mg daily. - Encourage adherence to a low-sodium diet.  Hyperlipidemia Hyperlipidemia managed with atorvastatin . Reports consistent medication adherence. - Continue atorvastatin  as prescribed.  Iron Deficiency Anemia Patient with last hemoglobin level in the 6.  We had advised increasing iron supplement to twice a day which she  states she has been doing up until a week ago when she cut back to 1 a day out of forgetfulness.  Encouraged her to take twice a day as prescribed. -She is due for colon cancer screening.  Discussed and recommend EGD and colonoscopy for completion of  workup of iron deficiency anemia.  Patient declines.  She prefers to have the fit test.  Hypothyroidism Hypothyroidism managed with levothyroxine  50 mcg daily. Previous thyroid function tests normal. - Continue levothyroxine  50 mcg daily.  Colon cancer screening Patient to do fit test.   Patient was given the opportunity to ask questions.  Patient verbalized understanding of the plan and was able to repeat key elements of the plan.   This documentation was completed using Paediatric nurse.  Any transcriptional errors are unintentional.  Orders Placed This Encounter  Procedures   Fecal occult blood, imunochemical([facility])   CBC   Iron, TIBC and Ferritin Panel   POCT glycosylated hemoglobin (Hb A1C)   POCT glucose (manual entry)     Requested Prescriptions   Signed Prescriptions Disp Refills   atorvastatin  (LIPITOR) 40 MG tablet 90 tablet 1    Sig: Take 1 tablet (40 mg total) by mouth daily.   ferrous sulfate  325 (65 FE) MG tablet 180 tablet 1    Sig: Take 1 tablet (325 mg total) by mouth 2 (two) times daily with a meal.    Return in about 4 months (around 10/12/2024).  Barnie Louder, MD, FACP

## 2024-06-12 ENCOUNTER — Ambulatory Visit: Payer: Self-pay | Admitting: Internal Medicine

## 2024-06-12 LAB — IRON,TIBC AND FERRITIN PANEL
Ferritin: 13 ng/mL — ABNORMAL LOW (ref 15–150)
Iron Saturation: 8 % — CL (ref 15–55)
Iron: 34 ug/dL (ref 27–159)
Total Iron Binding Capacity: 405 ug/dL (ref 250–450)
UIBC: 371 ug/dL (ref 131–425)

## 2024-06-12 LAB — CBC
Hematocrit: 36.9 % (ref 34.0–46.6)
Hemoglobin: 10.6 g/dL — ABNORMAL LOW (ref 11.1–15.9)
MCH: 21.5 pg — ABNORMAL LOW (ref 26.6–33.0)
MCHC: 28.7 g/dL — ABNORMAL LOW (ref 31.5–35.7)
MCV: 75 fL — ABNORMAL LOW (ref 79–97)
Platelets: 386 x10E3/uL (ref 150–450)
RBC: 4.94 x10E6/uL (ref 3.77–5.28)
RDW: 20.4 % — ABNORMAL HIGH (ref 11.7–15.4)
WBC: 10.4 x10E3/uL (ref 3.4–10.8)

## 2024-06-12 NOTE — Progress Notes (Signed)
 Let patient know that she is still anemic but it has improved.  Please continue to take the iron supplement twice a day.  This means she will take 1 tablet in the morning and 1 tablet every evening.

## 2024-06-13 ENCOUNTER — Ambulatory Visit: Payer: Self-pay | Attending: Internal Medicine

## 2024-06-14 LAB — FECAL OCCULT BLOOD, IMMUNOCHEMICAL: Fecal Occult Bld: NEGATIVE

## 2024-06-19 ENCOUNTER — Ambulatory Visit: Payer: Self-pay

## 2024-06-19 NOTE — Telephone Encounter (Signed)
    FYI Only or Action Required?: FYI only for provider.  Patient was last seen in primary care on 06/11/2024 by Vicci Barnie NOVAK, MD.  Called Nurse Triage reporting Advice Only.   Triage Disposition: Information or Advice Only Call  Patient/caregiver understands and will follow disposition?:YES   Using [facility]- [phone] ID#   YesCopied from CRM [id number]. Topic: Clinical - Lab/Test Results >> Jun 19, 2024  2:13 PM DeAngela L wrote: Reason for CRM: pt calling about he fecal test results   Pt num [phone] (M) Reason for Disposition  [1] Other NON-URGENT information for PCP AND [2] does not require PCP response  Answer Assessment - Initial Assessment Questions 1. REASON FOR CALL or QUESTION: What is your reason for calling today? or How can I best     Fit test results 2. CALLER: Document the source of call. (e.g., laboratory staff, caregiver or patient).     pt  Protocols used: PCP Call - No Triage-A-AH

## 2024-06-20 NOTE — Telephone Encounter (Signed)
Please see labs note

## 2024-07-08 ENCOUNTER — Other Ambulatory Visit: Payer: Self-pay

## 2024-08-08 ENCOUNTER — Other Ambulatory Visit: Payer: Self-pay

## 2024-08-27 ENCOUNTER — Other Ambulatory Visit: Payer: Self-pay

## 2024-10-08 ENCOUNTER — Other Ambulatory Visit: Payer: Self-pay | Admitting: Internal Medicine

## 2024-10-08 DIAGNOSIS — E039 Hypothyroidism, unspecified: Secondary | ICD-10-CM

## 2024-10-08 MED ORDER — LEVOTHYROXINE SODIUM 50 MCG PO TABS
75.0000 ug | ORAL_TABLET | Freq: Every day | ORAL | 2 refills | Status: AC
  Filled 2024-10-08: qty 45, 30d supply, fill #0
  Filled 2024-11-05 (×2): qty 45, 30d supply, fill #1
  Filled 2024-12-03: qty 45, 30d supply, fill #2

## 2024-10-09 ENCOUNTER — Other Ambulatory Visit: Payer: Self-pay

## 2024-10-10 ENCOUNTER — Other Ambulatory Visit: Payer: Self-pay

## 2024-10-14 ENCOUNTER — Telehealth: Payer: Self-pay | Admitting: Internal Medicine

## 2024-10-14 NOTE — Telephone Encounter (Signed)
 Confirmed appt for 11/10

## 2024-10-15 ENCOUNTER — Ambulatory Visit: Payer: Self-pay | Attending: Internal Medicine | Admitting: Internal Medicine

## 2024-10-15 ENCOUNTER — Other Ambulatory Visit: Payer: Self-pay

## 2024-10-15 ENCOUNTER — Encounter: Payer: Self-pay | Admitting: Internal Medicine

## 2024-10-15 VITALS — BP 183/79 | HR 80 | Resp 18 | Ht 62.0 in | Wt 153.8 lb

## 2024-10-15 DIAGNOSIS — E119 Type 2 diabetes mellitus without complications: Secondary | ICD-10-CM

## 2024-10-15 DIAGNOSIS — E1169 Type 2 diabetes mellitus with other specified complication: Secondary | ICD-10-CM

## 2024-10-15 DIAGNOSIS — Z1231 Encounter for screening mammogram for malignant neoplasm of breast: Secondary | ICD-10-CM

## 2024-10-15 DIAGNOSIS — E1159 Type 2 diabetes mellitus with other circulatory complications: Secondary | ICD-10-CM

## 2024-10-15 DIAGNOSIS — Z7984 Long term (current) use of oral hypoglycemic drugs: Secondary | ICD-10-CM

## 2024-10-15 DIAGNOSIS — I152 Hypertension secondary to endocrine disorders: Secondary | ICD-10-CM

## 2024-10-15 DIAGNOSIS — I10 Essential (primary) hypertension: Secondary | ICD-10-CM

## 2024-10-15 DIAGNOSIS — E785 Hyperlipidemia, unspecified: Secondary | ICD-10-CM

## 2024-10-15 DIAGNOSIS — E039 Hypothyroidism, unspecified: Secondary | ICD-10-CM

## 2024-10-15 DIAGNOSIS — E611 Iron deficiency: Secondary | ICD-10-CM

## 2024-10-15 LAB — POCT GLYCOSYLATED HEMOGLOBIN (HGB A1C): Hemoglobin A1C: 8.4 % — AB (ref 4.0–5.6)

## 2024-10-15 LAB — GLUCOSE, POCT (MANUAL RESULT ENTRY): POC Glucose: 155 mg/dL — AB (ref 70–99)

## 2024-10-15 MED ORDER — METFORMIN HCL 1000 MG PO TABS
1000.0000 mg | ORAL_TABLET | Freq: Two times a day (BID) | ORAL | 1 refills | Status: AC
  Filled 2024-10-15 – 2024-11-05 (×2): qty 180, 90d supply, fill #0

## 2024-10-15 MED ORDER — FERROUS SULFATE 325 (65 FE) MG PO TABS
325.0000 mg | ORAL_TABLET | Freq: Two times a day (BID) | ORAL | 4 refills | Status: AC
  Filled 2024-10-15: qty 180, 90d supply, fill #0

## 2024-10-15 MED ORDER — AMLODIPINE BESYLATE 10 MG PO TABS
10.0000 mg | ORAL_TABLET | Freq: Every day | ORAL | 4 refills | Status: AC
  Filled 2024-10-15 – 2024-11-05 (×2): qty 90, 90d supply, fill #0

## 2024-10-15 NOTE — Progress Notes (Signed)
 Patient ID: Cynthia Wiley, female    DOB: [birth year]  MRN: [id number]  CC: Diabetes   Subjective: Cynthia Wiley is a 58 y.o. female who presents for chronic ds management. Her concerns today include:  pt with hx of  DM type 2, HTN, HL, iron def and hypothyroid   [employer] Language interpreter used during this encounter. # [id number], Ami  Discussed the use of AI scribe software for clinical note transcription with the patient, who gave verbal consent to proceed.  History of Present Illness Cynthia Wiley is a 58 year old female with diabetes, hypertension, and hyperlipidemia who presents for follow-up.  DM: Results for orders placed or performed in visit on 10/15/24  POCT glucose (manual entry)   Collection Time: 10/15/24  5:03 PM  Result Value Ref Range   POC Glucose 155 (A) 70 - 99 mg/dl  POCT glycosylated hemoglobin (Hb A1C)   Collection Time: 10/15/24  5:06 PM  Result Value Ref Range   Hemoglobin A1C 8.4 (A) 4.0 - 5.6 %   HbA1c POC (<> result, manual entry)     HbA1c, POC (prediabetic range)     HbA1c, POC (controlled diabetic range)    Her hemoglobin A1c has increased to 8.4% from 7.2% previously. She attributes this change to dietary modifications made to address anemia, which included increasing her intake of red meat. She checks her blood sugar daily, with readings typically ranging from 105 to 135 mg/dL. She is currently taking metformin  1000 mg twice daily.   For hypertension, she is taking amlodipine  10 mg daily. She does not have a device to check her blood pressure at home but reports no symptoms such as chest pain or shortness of breath.  Regarding hyperlipidemia, she continues to take atorvastatin  40 mg daily.  Hypothyroidism: Reports compliance with taking levothyroxine .  She has a history of anemia and has been out of iron supplements for over a month. Suppose to be taking BID.  She has refused referral to gastroenterology for  endoscopy/colonoscopy.  Hemoglobin in April was 6.6 and had improved to 10.6 in July.  Last iron studies done in July revealed iron 34/8% saturation/ferritin of 13.  No dizziness or fatigue. She underwent a hysterectomy in 2014 and does not have menstrual cycles. She has not noticed any blood in her stools.      Patient Active Problem List   Diagnosis Date Noted   Pain in clavicular joint 05/09/2023   Anemia 02/14/2019   Hyperlipidemia 08/07/2017   DM2 (diabetes mellitus, type 2) (HCC) 01/22/2015   Hypothyroidism 01/22/2015     Current Outpatient Medications on File Prior to Visit  Medication Sig Dispense Refill   aspirin  EC 81 MG tablet Take 1 tablet (81 mg total) by mouth daily. 100 tablet 1   atorvastatin  (LIPITOR) 40 MG tablet Take 1 tablet (40 mg total) by mouth daily. 90 tablet 1   Blood Glucose Monitoring Suppl (TRUE METRIX METER) w/Device KIT Use as directed as needed. 1 kit 0   glucose blood (TRUE METRIX BLOOD GLUCOSE TEST) test strip USE AS INSTRUCTED 100 strip 2   levothyroxine  (SYNTHROID ) 50 MCG tablet Take 1.5 tablets (75 mcg total) by mouth daily. 45 tablet 2   TRUEplus Lancets 28G MISC Use to check blood sugar once daily. 100 each 2   diclofenac  Sodium (VOLTAREN ) 1 % GEL Apply 2 g topically 4 (four) times daily. To affected area (Patient not taking: Reported on 10/15/2024) 100 g 0  No current facility-administered medications on file prior to visit.    Allergies  Allergen Reactions   Onglyza [Saxagliptin ] Nausea Only   Sulfonylureas Other (See Comments)    Severe stomach ache with Amaryl  and glipizide  XL     Social History   Socioeconomic History   Marital status: Single    Spouse name: Not on file   Number of children: Not on file   Years of education: Not on file   Highest education level: Not on file  Occupational History   Not on file  Tobacco Use   Smoking status: Never   Smokeless tobacco: Never  Vaping Use   Vaping status: Never Used  Substance  and Sexual Activity   Alcohol use: No   Drug use: No   Sexual activity: Yes    Birth control/protection: None  Other Topics Concern   Not on file  Social History Narrative   Not on file   Social Drivers of Health   Financial Resource Strain: High Risk (02/19/2024)   Overall Financial Resource Strain (CARDIA)    Difficulty of Paying Living Expenses: Very hard  Food Insecurity: No Food Insecurity (02/19/2024)   Hunger Vital Sign    Worried About Running Out of Food in the Last Year: Never true    Ran Out of Food in the Last Year: Never true  Transportation Needs: No Transportation Needs (02/19/2024)   PRAPARE - Administrator, Civil Service (Medical): No    Lack of Transportation (Non-Medical): No  Physical Activity: Sufficiently Active (02/19/2024)   Exercise Vital Sign    Days of Exercise per Week: 6 days    Minutes of Exercise per Session: 60 min  Stress: No Stress Concern Present (02/19/2024)   [employer] of Occupational Health - Occupational Stress Questionnaire    Feeling of Stress : Only a little  Social Connections: Moderately Integrated (02/19/2024)   Social Connection and Isolation Panel    Frequency of Communication with Friends and Family: More than three times a week    Frequency of Social Gatherings with Friends and Family: More than three times a week    Attends Religious Services: More than 4 times per year    Active Member of Clubs or Organizations: Yes    Attends Banker Meetings: More than 4 times per year    Marital Status: Never married  Intimate Partner Violence: Not At Risk (02/19/2024)   Humiliation, Afraid, Rape, and Kick questionnaire    Fear of Current or Ex-Partner: No    Emotionally Abused: No    Physically Abused: No    Sexually Abused: No    Family History  Problem Relation Age of Onset   Diabetes Father    Heart disease Father    Diabetes Sister    Hyperlipidemia Brother     Past Surgical History:  Procedure  Laterality Date   ABDOMINAL HYSTERECTOMY N/A 01/31/2013   Procedure: HYSTERECTOMY ABDOMINAL;  Surgeon: Elveria Mungo, MD;  Location: [facility];  Service: Gynecology;  Laterality: N/A;   BILATERAL SALPINGECTOMY Bilateral 01/31/2013   Procedure: BILATERAL SALPINGECTOMY;  Surgeon: Elveria Mungo, MD;  Location: [facility];  Service: Gynecology;  Laterality: Bilateral;   CYSTOSCOPY WITH STENT PLACEMENT Bilateral 01/31/2013   Procedure: CYSTOSCOPY WITH STENT PLACEMENT;  Surgeon: Norleen JINNY Seltzer, MD;  Location: [facility];  Service: Urology;  Laterality: Bilateral;  with c-arm    ROS: Review of Systems Negative except as stated above  PHYSICAL EXAM: BP (!) 183/79  Pulse 80   Resp 18   Ht 5' 2 (1.575 m)   Wt 153 lb 12.8 oz (69.8 kg)   LMP 01/19/2013   SpO2 100%   BMI 28.13 kg/m   Wt Readings from Last 3 Encounters:  10/15/24 153 lb 12.8 oz (69.8 kg)  06/11/24 144 lb (65.3 kg)  02/19/24 142 lb (64.4 kg)    Physical Exam  General appearance - alert, well appearing, and in no distress Mental status - normal mood, behavior, speech, dress, motor activity, and thought processes Neck - supple, no significant adenopathy Chest - clear to auscultation, no wheezes, rales or rhonchi, symmetric air entry Heart - normal rate, regular rhythm, normal S1, S2, no murmurs, rubs, clicks or gallops Extremities - peripheral pulses normal, no pedal edema, no clubbing or cyanosis      Latest Ref Rng & Units 02/26/2024    5:06 PM 02/19/2024    2:34 PM 12/22/2022    4:46 PM  CMP  Glucose 70 - 99 mg/dL 90  93  83   BUN 6 - 24 mg/dL 9  11  9    Creatinine 0.57 - 1.00 mg/dL 9.38  9.33  9.29   Sodium 134 - 144 mmol/L 133  128  138   Potassium 3.5 - 5.2 mmol/L 4.6  4.3  3.8   Chloride 96 - 106 mmol/L 99  94  98   CO2 20 - 29 mmol/L 20  20  23    Calcium  8.7 - 10.2 mg/dL 9.3  9.2  9.5   Total Protein 6.0 - 8.5 g/dL  6.8  7.3   Total Bilirubin 0.0 - 1.2 mg/dL  0.4  0.5   Alkaline Phos 44 - 121 IU/L  88   102   AST 0 - 40 IU/L  19  20   ALT 0 - 32 IU/L  13  11    Lipid Panel     Component Value Date/Time   CHOL 191 02/19/2024 1434   TRIG 134 02/19/2024 1434   HDL 51 02/19/2024 1434   CHOLHDL 3.7 02/19/2024 1434   CHOLHDL 2.7 02/07/2017 1626   VLDL 17 02/07/2017 1626   LDLCALC 116 (H) 02/19/2024 1434    CBC    Component Value Date/Time   WBC 10.4 06/11/2024 1704   WBC 7.1 01/22/2015 1047   RBC 4.94 06/11/2024 1704   RBC 4.73 01/22/2015 1047   HGB 10.6 (L) 06/11/2024 1704   HCT 36.9 06/11/2024 1704   PLT 386 06/11/2024 1704   MCV 75 (L) 06/11/2024 1704   MCH 21.5 (L) 06/11/2024 1704   MCH 26.8 01/22/2015 1047   MCHC 28.7 (L) 06/11/2024 1704   MCHC 32.0 01/22/2015 1047   RDW 20.4 (H) 06/11/2024 1704   LYMPHSABS 2.4 01/06/2009 2230   MONOABS 0.6 01/06/2009 2230   EOSABS 0.1 01/06/2009 2230   BASOSABS 0.0 01/06/2009 2230    ASSESSMENT AND PLAN: 1. Type 2 diabetes mellitus with other specified complication, without long-term current use of insulin  (HCC) (Primary) Not at goal. Advised to cut back on red meat and eat more lean meat like chicken, turkey and seafood. Continue metformin .  Advised that we add another oral medication or insulin  but patient declines wanting just to remain on metformin . - POCT glycosylated hemoglobin (Hb A1C) - POCT glucose (manual entry) - metFORMIN  (GLUCOPHAGE ) 1000 MG tablet; Take 1 tablet (1,000 mg total) by mouth 2 (two) times daily with a meal.  Dispense: 180 tablet; Refill: 1  2. Diabetes mellitus  treated with oral medication (HCC) See #1 above.  3. Hypertension associated with type 2 diabetes mellitus (HCC) Not at goal.  Discussed cardiovascular risks associated with uncontrolled blood pressure. I recommend adding another antihypertensive medication but patient declined. - amLODipine  (NORVASC ) 10 MG tablet; Take 1 tablet (10 mg total) by mouth daily.  Dispense: 90 tablet; Refill: 4  4. Hyperlipidemia associated with type 2 diabetes  mellitus (HCC) Continue atorvastatin   5. Acquired hypothyroidism Continue levothyroxine  50 mcg daily.  Last TSH done earlier this year was normal.  6. Iron deficiency Strongly advised referral to gastroenterology for endoscopy/colonoscopy to make sure she does not have any underlying gastrointestinal cancer.  Patient declines stating that she does not want anything up her rectum. -Refill given on iron supplement.  Lab has close for the afternoon but I requested that she returns to the lab after she has been back on the iron supplement for about 1 month for recheck of blood tests. - ferrous sulfate  325 (65 FE) MG tablet; Take 1 tablet (325 mg total) by mouth 2 (two) times daily with a meal.  Dispense: 180 tablet; Refill: 4  7. Encounter for screening mammogram for malignant neoplasm of breast Mammogram scholarship given. - MM 3D SCREENING MAMMOGRAM BILATERAL BREAST; Future  Patient was given the opportunity to ask questions.  Patient verbalized understanding of the plan and was able to repeat key elements of the plan.   This documentation was completed using Paediatric nurse.  Any transcriptional errors are unintentional.  Orders Placed This Encounter  Procedures   MM 3D SCREENING MAMMOGRAM BILATERAL BREAST   POCT glycosylated hemoglobin (Hb A1C)   POCT glucose (manual entry)     Requested Prescriptions   Signed Prescriptions Disp Refills   amLODipine  (NORVASC ) 10 MG tablet 90 tablet 4    Sig: Take 1 tablet (10 mg total) by mouth daily.   metFORMIN  (GLUCOPHAGE ) 1000 MG tablet 180 tablet 1    Sig: Take 1 tablet (1,000 mg total) by mouth 2 (two) times daily with a meal.   ferrous sulfate  325 (65 FE) MG tablet 180 tablet 4    Sig: Take 1 tablet (325 mg total) by mouth 2 (two) times daily with a meal.    Return in about 4 months (around 02/12/2025).  Barnie Louder, MD, FACP

## 2024-10-15 NOTE — Patient Instructions (Addendum)
  VISIT SUMMARY: Today, we reviewed your diabetes, hypertension, hyperlipidemia, and anemia. We discussed your recent lab results and made some adjustments to your treatment plan to better manage your conditions.  YOUR PLAN: -TYPE 2 DIABETES MELLITUS: Your hemoglobin A1c has increased to 8.4%, indicating that your blood sugar levels are not as well controlled as we would like. We will continue your current dose of metformin  at 1000 mg twice daily. I encourage you to focus on dietary modifications, such as incorporating more lean meats and reducing your intake of beef, to help manage your blood sugar levels.  -HYPERTENSION: Your blood pressure is elevated at 183/79 mmHg, which increases your risk for heart problems. We will continue your current dose of amlodipine  at 10 mg daily and add another medication to help lower your blood pressure.  -HYPERLIPIDEMIA: Your cholesterol levels are being managed with atorvastatin  40 mg daily. We will continue this medication as it is helping to control your cholesterol.  -IRON DEFICIENCY ANEMIA: You have anemia, which means you have a lower than normal number of red blood cells. We will refill your iron supplements and refer you to a gastroenterologist for further evaluation. We will also order blood tests to recheck your anemia and thyroid levels after one month of taking the iron supplements.  -HYPOTHYROIDISM: There was a recent issue with your thyroid medication refill. We will address this to ensure you have the medication you need.  -SCREENING MAMMOGRAM FOR BREAST CANCER: You are overdue for a routine screening mammogram. We have provided you with a mammogram scholarship for a free screening and have ordered the mammogram for you.  INSTRUCTIONS: Please follow up with the gastroenterologist as referred for your anemia. Continue taking your medications as prescribed and make the suggested dietary changes. Schedule and complete your mammogram screening as soon  as possible. We will recheck your blood tests for anemia and thyroid levels after one month of iron supplementation. If you have any new symptoms or concerns, please contact our office.                      Contains text generated by Abridge.                                 Contains text generated by Abridge.

## 2024-10-17 ENCOUNTER — Ambulatory Visit: Payer: Self-pay | Attending: Internal Medicine

## 2024-10-17 DIAGNOSIS — E611 Iron deficiency: Secondary | ICD-10-CM

## 2024-10-17 DIAGNOSIS — E039 Hypothyroidism, unspecified: Secondary | ICD-10-CM

## 2024-10-18 ENCOUNTER — Ambulatory Visit: Payer: Self-pay | Admitting: Internal Medicine

## 2024-10-18 LAB — CBC
Hematocrit: 35.4 % (ref 34.0–46.6)
Hemoglobin: 10.9 g/dL — ABNORMAL LOW (ref 11.1–15.9)
MCH: 24.8 pg — ABNORMAL LOW (ref 26.6–33.0)
MCHC: 30.8 g/dL — ABNORMAL LOW (ref 31.5–35.7)
MCV: 81 fL (ref 79–97)
Platelets: 383 x10E3/uL (ref 150–450)
RBC: 4.39 x10E6/uL (ref 3.77–5.28)
RDW: 14.3 % (ref 11.7–15.4)
WBC: 6.7 x10E3/uL (ref 3.4–10.8)

## 2024-10-18 LAB — TSH: TSH: 3.39 u[IU]/mL (ref 0.450–4.500)

## 2024-10-22 ENCOUNTER — Telehealth: Payer: Self-pay

## 2024-10-22 NOTE — Telephone Encounter (Signed)
 Telephoned patient at mobile number using interpreter, Francee Sprung. Mailed patient at mammogram scholarship application. Confirmed patient's address. [facility]

## 2024-10-25 ENCOUNTER — Telehealth: Payer: Self-pay

## 2024-10-25 NOTE — Telephone Encounter (Signed)
 Telephoned patient at mobile number using interpreter#[id number]. Patient at work and will call back and complete mammogram scholarship screening process. [facility]

## 2024-11-05 ENCOUNTER — Other Ambulatory Visit: Payer: Self-pay | Admitting: Internal Medicine

## 2024-11-05 ENCOUNTER — Other Ambulatory Visit: Payer: Self-pay

## 2024-11-05 ENCOUNTER — Telehealth: Payer: Self-pay

## 2024-11-05 MED ORDER — TRUEPLUS LANCETS 28G MISC
2 refills | Status: AC
  Filled 2024-11-05: qty 100, 100d supply, fill #0

## 2024-11-05 NOTE — Telephone Encounter (Signed)
 Telephoned patient at mobile number using interpreter, Francee Sprung. Patient left a voice message with [facility] to return her call. [facility] returned patient's call. Patient at work and will call back when she is not at work.

## 2024-11-06 ENCOUNTER — Other Ambulatory Visit (HOSPITAL_COMMUNITY): Payer: Self-pay

## 2024-11-18 ENCOUNTER — Other Ambulatory Visit: Payer: Self-pay | Admitting: Internal Medicine

## 2024-11-18 ENCOUNTER — Ambulatory Visit: Payer: Self-pay | Attending: Internal Medicine

## 2024-11-18 DIAGNOSIS — E611 Iron deficiency: Secondary | ICD-10-CM

## 2024-11-19 ENCOUNTER — Ambulatory Visit: Payer: Self-pay | Admitting: Internal Medicine

## 2024-11-19 LAB — IRON,TIBC AND FERRITIN PANEL
Ferritin: 8 ng/mL — ABNORMAL LOW (ref 15–150)
Iron Saturation: 7 % — CL (ref 15–55)
Iron: 26 ug/dL — ABNORMAL LOW (ref 27–159)
Total Iron Binding Capacity: 393 ug/dL (ref 250–450)
UIBC: 367 ug/dL (ref 131–425)

## 2024-11-19 LAB — CBC
Hematocrit: 38.1 % (ref 34.0–46.6)
Hemoglobin: 11.4 g/dL (ref 11.1–15.9)
MCH: 24.1 pg — ABNORMAL LOW (ref 26.6–33.0)
MCHC: 29.9 g/dL — ABNORMAL LOW (ref 31.5–35.7)
MCV: 81 fL (ref 79–97)
Platelets: 440 x10E3/uL (ref 150–450)
RBC: 4.73 x10E6/uL (ref 3.77–5.28)
RDW: 14.1 % (ref 11.7–15.4)
WBC: 10.1 x10E3/uL (ref 3.4–10.8)

## 2024-12-03 ENCOUNTER — Other Ambulatory Visit: Payer: Self-pay

## 2024-12-19 ENCOUNTER — Ambulatory Visit: Payer: Self-pay | Admitting: Internal Medicine

## 2024-12-26 ENCOUNTER — Ambulatory Visit
Admission: RE | Admit: 2024-12-26 | Discharge: 2024-12-26 | Disposition: A | Discharge status: Home / Self Care | Payer: Self-pay | Source: Ambulatory Visit | Attending: Internal Medicine | Admitting: Internal Medicine

## 2024-12-26 DIAGNOSIS — Z1231 Encounter for screening mammogram for malignant neoplasm of breast: Secondary | ICD-10-CM

## 2025-02-18 ENCOUNTER — Ambulatory Visit: Payer: Self-pay | Admitting: Internal Medicine
